# Patient Record
Sex: Male | Born: 1940 | Race: White | Hispanic: No | Marital: Single | Smoking: Former smoker
Health system: Southern US, Community
[De-identification: ages and names within clinical notes are randomized; demographics above are authoritative.]

## PROBLEM LIST (undated history)

## (undated) DIAGNOSIS — E785 Hyperlipidemia, unspecified: Secondary | ICD-10-CM

## (undated) DIAGNOSIS — E119 Type 2 diabetes mellitus without complications: Secondary | ICD-10-CM

## (undated) DIAGNOSIS — I10 Essential (primary) hypertension: Secondary | ICD-10-CM

## (undated) HISTORY — DX: Hyperlipidemia, unspecified: E78.5

## (undated) HISTORY — DX: Essential (primary) hypertension: I10

## (undated) HISTORY — DX: Type 2 diabetes mellitus without complications: E11.9

## (undated) HISTORY — PX: KNEE SURGERY: SHX244

## (undated) HISTORY — PX: OTHER SURGICAL HISTORY: SHX169

## (undated) HISTORY — PX: SALIVARY GLAND SURGERY: SHX768

---

## 2005-04-20 ENCOUNTER — Emergency Department: Payer: Self-pay | Admitting: Emergency Medicine

## 2005-04-20 ENCOUNTER — Other Ambulatory Visit: Payer: Self-pay

## 2010-09-05 ENCOUNTER — Ambulatory Visit: Payer: Self-pay

## 2015-03-09 DIAGNOSIS — M1990 Unspecified osteoarthritis, unspecified site: Secondary | ICD-10-CM | POA: Insufficient documentation

## 2016-03-09 ENCOUNTER — Ambulatory Visit (INDEPENDENT_AMBULATORY_CARE_PROVIDER_SITE_OTHER): Payer: Medicare Other | Admitting: Family Medicine

## 2016-03-09 ENCOUNTER — Encounter: Payer: Self-pay | Admitting: Family Medicine

## 2016-03-09 VITALS — BP 122/64 | HR 89 | Temp 97.7°F | Ht 67.5 in | Wt 239.8 lb

## 2016-03-09 DIAGNOSIS — N400 Enlarged prostate without lower urinary tract symptoms: Secondary | ICD-10-CM

## 2016-03-09 DIAGNOSIS — R600 Localized edema: Secondary | ICD-10-CM | POA: Diagnosis not present

## 2016-03-09 DIAGNOSIS — R300 Dysuria: Secondary | ICD-10-CM

## 2016-03-09 DIAGNOSIS — R079 Chest pain, unspecified: Secondary | ICD-10-CM

## 2016-03-09 DIAGNOSIS — I878 Other specified disorders of veins: Secondary | ICD-10-CM

## 2016-03-09 LAB — POCT URINALYSIS DIPSTICK
Bilirubin, UA: NEGATIVE
Blood, UA: NEGATIVE
Glucose, UA: NEGATIVE
Ketones, UA: NEGATIVE
LEUKOCYTES UA: NEGATIVE
NITRITE UA: NEGATIVE
PROTEIN UA: NEGATIVE
SPEC GRAV UA: 1.025
UROBILINOGEN UA: 0.2
pH, UA: 6

## 2016-03-09 MED ORDER — GLIPIZIDE 10 MG PO TABS: ORAL_TABLET | ORAL | Status: DC

## 2016-03-09 MED ORDER — CEPHALEXIN 500 MG PO CAPS: 500.0000 mg | ORAL_CAPSULE | Freq: Two times a day (BID) | ORAL | Status: DC

## 2016-03-09 NOTE — Progress Notes (Signed)
Pre visit review using our clinic review tool, if applicable. No additional management support is needed unless otherwise documented below in the visit note. 

## 2016-03-09 NOTE — Patient Instructions (Signed)
Nice to meet you. We are going to refer you to a cardiologist for your chest pain. We will start you on an antibiotic to cover for possible infection in your legs. We're going to check some urine as well. If you develop increased leg swelling, pain, chest pain, shortness of breath, palpitations, burning with urination, abdominal pain, or any new or changing symptoms please seek medical attention.

## 2016-03-10 LAB — TSH: TSH: 3.09 u[IU]/mL (ref 0.35–4.50)

## 2016-03-12 ENCOUNTER — Encounter: Payer: Self-pay | Admitting: Family Medicine

## 2016-03-12 DIAGNOSIS — I878 Other specified disorders of veins: Secondary | ICD-10-CM | POA: Insufficient documentation

## 2016-03-12 DIAGNOSIS — N400 Enlarged prostate without lower urinary tract symptoms: Secondary | ICD-10-CM | POA: Insufficient documentation

## 2016-03-12 DIAGNOSIS — R079 Chest pain, unspecified: Secondary | ICD-10-CM | POA: Insufficient documentation

## 2016-03-12 NOTE — Assessment & Plan Note (Signed)
Suspect patient's bilateral lower external swelling is related to venous stasis. Unlikely DVT given chronic duration and bilateral swelling with legs diameters less than 3 cm from each other. The areas of erythema are most likely stasis dermatitis though with warmth we'll cover with Keflex for possible cellulitic infection. 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 Discussed elevation of legs. Discussed obtaining compression stockings. Patient will continue to monitor. He is given return precautions.

## 2016-03-12 NOTE — Progress Notes (Signed)
Patient ID: Patrick Patel, male   DOB: 1941/04/08, 75 y.o.   MRN: [id number]  Marikay Alar, MD Phone: [phone]  Patrick Patel is a 75 y.o. male who presents today for new patient visit.  Bilateral leg swelling: Patient notes for about the last year and a half his bilateral lower legs have been swelling intermittently. He notes the right swells greater than left. Notes he does get shooting pains in his legs. Notes occasionally his lower legs will become red. Swelling is down in the morning and comes back as the day goes on. He does note some lumps within his legs. He does note a small patch of skin on his left leg that is intermittently numb associated with this swelling. This previously been evaluated by his former PCP and advised to elevate his legs.  Chest pain: Patient notes occasionally he will get crushing chest pressure that will go away within minutes. Occurs occasionally after eating breakfast and also when he does something physical. Occurs 2-3 times a week. Last occurrence was the day prior to this visit. No radiation of the pain. No diaphoresis. There is associated shortness of breath. No history of DVT or PE. This is been going on for months. No recent surgeries or periods of inactivity. No chest pain or shortness of breath at this time.  Pain with urination: Patient notes the pain is described as the urine not wanting to come out as fast as it should. It is not a shooting pain. He has had his prostate checked previously and has a history of a slight PSA elevation. No burning with urination, frequency, or urgency. Does report difficulty keeping his stream area  Active Ambulatory Problems    Diagnosis Date Noted  . Chest pain 03/12/2016  . BPH (benign prostatic hyperplasia) 03/12/2016  . Venous stasis of both lower extremities 03/12/2016   Resolved Ambulatory Problems    Diagnosis Date Noted  . No Resolved Ambulatory Problems   Past Medical History  Diagnosis Date  .  Diabetes (HCC)   . Hypertension   . Hyperlipidemia     No family history on file.  Social History   Social History  . Marital Status: Single    Spouse Name: N/A  . Number of Children: N/A  . Years of Education: N/A   Occupational History  . Not on file.   Social History Main Topics  . Smoking status: Former Games developer  . Smokeless tobacco: Not on file  . Alcohol Use: No  . Drug Use: No  . Sexual Activity: Not on file   Other Topics Concern  . Not on file   Social History Narrative    ROS   General:  Negative for nexplained weight loss, fever Skin: Negative for new or changing mole, sore that won't heal HEENT: Negative for trouble hearing, trouble seeing, ringing in ears, mouth sores, hoarseness, change in voice, dysphagia. CV:  Positive for chest pain, dyspnea, edema, negative palpitations Resp: Negative for cough, hemoptysis GI: Positive for diarrhea, constipation, Negative for nausea, vomiting, abdominal pain, melena, hematochezia. GU: Positive for dysuria, urinary hesitancy, negative for incontinence, hematuria, vaginal or penile discharge, polyuria, sexual difficulty, lumps in testicle or breasts MSK: Positive for muscle cramps or aches, joint pain or swelling Neuro: Positive for numbness, Negative for headaches, weakness, dizziness, passing out/fainting Psych: Negative for depression, anxiety, memory problems  Objective  Physical Exam Filed Vitals:   03/09/16 1529  BP: 122/64  Pulse: 89  Temp: 97.7 F (36.5 C)  BP Readings from Last 3 Encounters:  03/09/16 122/64   Wt Readings from Last 3 Encounters:  03/09/16 239 lb 12.8 oz (108.773 kg)    Physical Exam  Constitutional: No distress.  HENT:  Head: Normocephalic and atraumatic.  Right Ear: External ear normal.  Left Ear: External ear normal.  Mouth/Throat: Oropharynx is clear and moist. No oropharyngeal exudate.  Eyes: Conjunctivae are normal. Pupils are equal, round, and reactive to light.    Neck: Neck supple.  Cardiovascular: Normal rate, regular rhythm and normal heart sounds.  Exam reveals no gallop and no friction rub.   No murmur heard. Pulmonary/Chest: Effort normal and breath sounds normal. No respiratory distress. He has no wheezes. He has no rales.  Abdominal: Soft. Bowel sounds are normal. He exhibits no distension. There is no tenderness. There is no rebound and no guarding.  Genitourinary: Rectum normal.  Prostate moderately enlarged, non-nodular  Musculoskeletal:  Bilateral lower extremities with mild swelling, right mildly greater than left with right calf 40 cm and left calf 38 cm there is scattered and tenderness over the anterior portion of his lower extremities bilaterally, no cords palpated in the calves, there is venous stasis dermatitis noted with possible warmth over the medial aspects of his lower legs above the ankles  Lymphadenopathy:    He has no cervical adenopathy.  Neurological: He is alert.  CN 2-12 intact, 5/5 strength in bilateral biceps, triceps, grip, quads, hamstrings, plantar and dorsiflexion, sensation to light touch intact in bilateral UE and LE, normal gait, 2+ patellar reflexes  Skin: Skin is warm and dry. He is not diaphoretic.  Psychiatric: Mood and affect normal.   EKG: Sinus rhythm, first-degree AV block, rate 91, no ischemic changes noted  Assessment/Plan:   Chest pain Patient with typical sounding anginal chest pain. EKG is no apparent ischemic changes. 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 Given the typical nature of his chest pain I advised referral cardiology. Referral has been made. Patient will continue to monitor symptoms. He is given return precautions.  BPH (benign prostatic hyperplasia) Suspect urinary symptoms or BPH related. We'll check a UA. If negative would consider referral to urology for further evaluation and management.  Venous stasis of both lower extremities Suspect patient's bilateral lower external swelling is  related to venous stasis. Unlikely DVT given chronic duration and bilateral swelling with legs diameters less than 3 cm from each other. The areas of erythema are most likely stasis dermatitis though with warmth we'll cover with Keflex for possible cellulitic infection. 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 Discussed elevation of legs. Discussed obtaining compression stockings. Patient will continue to monitor. He is given return precautions.    Orders Placed This Encounter  Procedures  . TSH  . Ambulatory referral to Cardiology    Referral Priority:  Routine    Referral Type:  Consultation    Referral Reason:  Specialty Services Required    Requested Specialty:  Cardiology    Number of Visits Requested:  1  . POCT urinalysis dipstick  . EKG 12-Lead  After the patient left the exam room he ended up asking the nurse for a refill on his glipizide. She confirmed his glipizide dosing. She advised him that he would need to return for an office visit to discuss this further. Refill was sent to his pharmacy.  Marikay AlarEric Irisha Grandmaison, MD [facility] [place]

## 2016-03-12 NOTE — Assessment & Plan Note (Signed)
Patient with typical sounding anginal chest pain. EKG is no apparent ischemic changes. 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 Given the typical nature of his chest pain I advised referral cardiology. Referral has been made. Patient will continue to monitor symptoms. He is given return precautions.

## 2016-03-12 NOTE — Assessment & Plan Note (Signed)
Suspect urinary symptoms or BPH related. We'll check a UA. If negative would consider referral to urology for further evaluation and management.

## 2016-03-13 ENCOUNTER — Encounter: Payer: Self-pay | Admitting: Family Medicine

## 2016-03-29 DIAGNOSIS — Z0000 Encounter for general adult medical examination without abnormal findings: Secondary | ICD-10-CM | POA: Diagnosis not present

## 2016-03-29 DIAGNOSIS — Z23 Encounter for immunization: Secondary | ICD-10-CM | POA: Diagnosis not present

## 2016-03-29 DIAGNOSIS — Z1389 Encounter for screening for other disorder: Secondary | ICD-10-CM | POA: Diagnosis not present

## 2016-04-03 DIAGNOSIS — Z1211 Encounter for screening for malignant neoplasm of colon: Secondary | ICD-10-CM | POA: Diagnosis not present

## 2016-04-04 ENCOUNTER — Other Ambulatory Visit: Payer: Self-pay | Admitting: Surgical

## 2016-04-04 NOTE — Telephone Encounter (Signed)
Please confirm dosing with patient. Please also see if he saw cardiology as a referral was placed. Thanks.

## 2016-04-04 NOTE — Telephone Encounter (Signed)
Can we refill this medication? Last office visit 03/09/16 NP visit.

## 2016-04-05 MED ORDER — GLIPIZIDE 10 MG PO TABS: ORAL_TABLET | ORAL | Status: DC

## 2016-04-05 MED ORDER — GEMFIBROZIL 600 MG PO TABS: 600.0000 mg | ORAL_TABLET | Freq: Two times a day (BID) | ORAL | Status: DC

## 2016-04-05 NOTE — Telephone Encounter (Signed)
LM for patient to return call.

## 2016-04-06 ENCOUNTER — Other Ambulatory Visit: Payer: Self-pay | Admitting: Surgical

## 2016-04-06 MED ORDER — GLIPIZIDE 10 MG PO TABS: ORAL_TABLET | ORAL | Status: DC

## 2016-04-06 MED ORDER — GEMFIBROZIL 600 MG PO TABS: 600.0000 mg | ORAL_TABLET | Freq: Two times a day (BID) | ORAL | Status: DC

## 2016-04-13 ENCOUNTER — Encounter: Payer: Self-pay | Admitting: Family Medicine

## 2016-04-13 ENCOUNTER — Ambulatory Visit (INDEPENDENT_AMBULATORY_CARE_PROVIDER_SITE_OTHER): Payer: Medicare Other | Admitting: Family Medicine

## 2016-04-13 VITALS — BP 136/70 | HR 89 | Temp 98.6°F | Ht 67.5 in | Wt 243.6 lb

## 2016-04-13 DIAGNOSIS — I878 Other specified disorders of veins: Secondary | ICD-10-CM

## 2016-04-13 DIAGNOSIS — R079 Chest pain, unspecified: Secondary | ICD-10-CM | POA: Diagnosis not present

## 2016-04-13 DIAGNOSIS — E119 Type 2 diabetes mellitus without complications: Secondary | ICD-10-CM

## 2016-04-13 DIAGNOSIS — I208 Other forms of angina pectoris: Secondary | ICD-10-CM

## 2016-04-13 DIAGNOSIS — I872 Venous insufficiency (chronic) (peripheral): Secondary | ICD-10-CM | POA: Diagnosis not present

## 2016-04-13 DIAGNOSIS — E785 Hyperlipidemia, unspecified: Secondary | ICD-10-CM | POA: Insufficient documentation

## 2016-04-13 DIAGNOSIS — E1140 Type 2 diabetes mellitus with diabetic neuropathy, unspecified: Secondary | ICD-10-CM | POA: Insufficient documentation

## 2016-04-13 DIAGNOSIS — I2089 Other forms of angina pectoris: Secondary | ICD-10-CM

## 2016-04-13 MED ORDER — DOXYCYCLINE HYCLATE 100 MG PO TABS: 100.0000 mg | ORAL_TABLET | Freq: Two times a day (BID) | ORAL | Status: DC

## 2016-04-13 MED ORDER — METFORMIN HCL 850 MG PO TABS: 850.0000 mg | ORAL_TABLET | Freq: Three times a day (TID) | ORAL | Status: DC

## 2016-04-13 NOTE — Progress Notes (Signed)
Patient ID: Patrick Patel, male   DOB: 01/18/1941, 75 y.o.   MRN: [id number]  Patrick AlarEric Arcola Freshour, MD Phone: [phone]  Patrick D Faustino CongressGaster is a 75 y.o. male who presents today for follow-up.  DIABETES Disease Monitoring: Blood Sugar ranges-rarely gets below 200, does not check very frequently Polyuria/phagia/dipsia- some polyuria      ophthalmology- has an appointment next month Medications: Compliance- metformin 1000 mg twice daily, glipizide 20 mg twice daily Hypoglycemic symptoms- no  HYPERLIPIDEMIA Symptoms Chest pain on exertion:  Yes, see below   Leg claudication:   No Medications: Compliance- taking Lipitor and Lopid Right upper quadrant pain- no  Exertional chest pain: Patient notes he has continued intermittent chest pain. Lasts for 5-10 minutes with exertion. Resolves with rest. Does get some shortness of breath with it. He does note sometimes he is short of breath without chest pain. Some palpitations as well. He does note most the time the symptoms occur about an hour after eating. He did use to smoke. No history of DVT. He has no chest pain or shortness breath at this time. He has an appointment with cardiology next week.  Venous insufficiency: Patient's bilateral legs with swelling and venous insufficiency skin changes. No calf pain noted. This redness did improve some with Keflex after last visit. Notes the redness has gotten mildly worse since coming off the antibiotics. Notes his right leg has been oozing from the lateral aspect. No fevers. Does note some pain in his distal lower leg over the anterior aspect occasionally. Notes pain gets improved with walking.  PMH: Former smoker   ROS see history of present illness  Objective  Physical Exam Filed Vitals:   04/13/16 1546  BP: 136/70  Pulse: 89  Temp: 98.6 F (37 C)    BP Readings from Last 3 Encounters:  04/13/16 136/70  03/09/16 122/64   Wt Readings from Last 3 Encounters:  04/13/16 243 lb 9.6 oz (110.496 kg)    03/09/16 239 lb 12.8 oz (108.773 kg)    Physical Exam  Constitutional: He is well-developed, well-nourished, and in no distress.  HENT:  Head: Normocephalic and atraumatic.  Mouth/Throat: Oropharynx is clear and moist. No oropharyngeal exudate.  Eyes: Conjunctivae are normal. Pupils are equal, round, and reactive to light.  Cardiovascular: Normal rate, regular rhythm and normal heart sounds.   Pulmonary/Chest: Effort normal and breath sounds normal.  Musculoskeletal:  Bilateral lower extremities with trace edema, distal lower extremity with mild erythema bilaterally with some warmth, right lateral aspect of the leg with minimal scabbing and oozing, calves are nontender with no cords, leg is nontender, 2+ DP pulses  Neurological: He is alert. Gait normal.  Skin: Skin is warm and dry. He is not diaphoretic.    EKG: Normal sinus rhythm, first-degree AV block, rate 85, no ST or T-wave changes  Assessment/Plan: Please see individual problem list.  Venous stasis of both lower extremities Swelling in legs consistent with venous insufficiency and stasis dermatitis, though there is drainage and warmth which could be consistent with an infectious cause. We'll check a CBC to look at a white count. We will start him on Doxy and if the white count is normal we'll have him stop this. We will refer him to vascular surgery for further management and evaluation of his venous insufficiency. He is given return precautions.  Chest pain Continues to have this intermittently. EKG remains reassuring today. No symptoms at this time. We will additionally check a chest x-ray to ensure no pulmonary  cause. He will keep the appointment with vascular surgery cardiology next week. Given return precautions.  Diabetes (HCC) Appears to be uncontrolled given his CBG reports. We'll check a BMP today. Last A1c was 7.7. We will increase his metformin to max dose of 850 3 times a day. He will use 1500 mg in the morning and  1000 mg at night to finish his current prescription. I discussed this with him. He'll see an eye doctor in the next month. May need to consider additional medication if blood sugar does not become under better control.  Hyperlipidemia Continue Lopid and Lipitor.    Orders Placed This Encounter  Procedures  . DG Chest 2 View    Standing Status: Future     Number of Occurrences:      Standing Expiration Date: 06/13/2017    Order Specific Question:  Reason for Exam (SYMPTOM  OR DIAGNOSIS REQUIRED)    Answer:  chronic intermittent chest pain and shortness of breath    Order Specific Question:  Preferred imaging location?    Answer:  [facility]  . Basic Metabolic Panel (BMET)  . CBC  . Ambulatory referral to Vascular Surgery    Referral Priority:  Routine    Referral Type:  Surgical    Referral Reason:  Specialty Services Required    Requested Specialty:  Vascular Surgery    Number of Visits Requested:  1  . EKG 12-Lead    Meds ordered this encounter  Medications  . DISCONTD: doxycycline (VIBRA-TABS) 100 MG tablet    Sig: Take 1 tablet (100 mg total) by mouth 2 (two) times daily.    Dispense:  20 tablet    Refill:  0  . metFORMIN (GLUCOPHAGE) 850 MG tablet    Sig: Take 1 tablet (850 mg total) by mouth 3 (three) times daily with meals.    Dispense:  90 tablet    Refill:  3  . doxycycline (VIBRA-TABS) 100 MG tablet    Sig: Take 1 tablet (100 mg total) by mouth 2 (two) times daily.    Dispense:  20 tablet    Refill:  0    Patrick Alar, MD [facility]

## 2016-04-13 NOTE — Assessment & Plan Note (Signed)
Continues to have this intermittently. EKG remains reassuring today. No symptoms at this time. We will additionally check a chest x-ray to ensure no pulmonary cause. He will keep the appointment with vascular surgery cardiology next week. Given return precautions.

## 2016-04-13 NOTE — Patient Instructions (Addendum)
Nice to see you. Please keep your appointment with cardiologist. We will increase your metformin to the maximum dose to treat your diabetes. Please continue your cholesterol medications. We are going to refer you to vascular surgery for your legs for further evaluation. Please go get the chest x-ray to evaluate your chest pain further as well. If you develop chest pain, shortness of breath, palpitations, sweating, leg pain, increased size in one leg versus the other, fevers, or any new or changing symptoms please seek medical attention.

## 2016-04-13 NOTE — Assessment & Plan Note (Signed)
Continue Lopid and Lipitor.

## 2016-04-13 NOTE — Assessment & Plan Note (Signed)
Appears to be uncontrolled given his CBG reports. We'll check a BMP today. Last A1c was 7.7. We will increase his metformin to max dose of 850 3 times a day. He will use 1500 mg in the morning and 1000 mg at night to finish his current prescription. I discussed this with him. He'll see an eye doctor in the next month. May need to consider additional medication if blood sugar does not become under better control.

## 2016-04-13 NOTE — Progress Notes (Signed)
Pre visit review using our clinic review tool, if applicable. No additional management support is needed unless otherwise documented below in the visit note. 

## 2016-04-13 NOTE — Assessment & Plan Note (Signed)
Swelling in legs consistent with venous insufficiency and stasis dermatitis, though there is drainage and warmth which could be consistent with an infectious cause. We'll check a CBC to look at a white count. We will start him on Doxy and if the white count is normal we'll have him stop this. We will refer him to vascular surgery for further management and evaluation of his venous insufficiency. He is given return precautions.

## 2016-04-14 LAB — CBC
HCT: 40.2 % (ref 39.0–52.0)
HEMOGLOBIN: 13.9 g/dL (ref 13.0–17.0)
MCHC: 34.5 g/dL (ref 30.0–36.0)
MCV: 91.2 fl (ref 78.0–100.0)
Platelets: 256 10*3/uL (ref 150.0–400.0)
RBC: 4.41 Mil/uL (ref 4.22–5.81)
RDW: 13.2 % (ref 11.5–15.5)
WBC: 7.7 10*3/uL (ref 4.0–10.5)

## 2016-04-14 LAB — BASIC METABOLIC PANEL
BUN: 21 mg/dL (ref 6–23)
CHLORIDE: 100 meq/L (ref 96–112)
CO2: 26 mEq/L (ref 19–32)
Calcium: 10.2 mg/dL (ref 8.4–10.5)
Creatinine, Ser: 0.83 mg/dL (ref 0.40–1.50)
GFR: 96.35 mL/min (ref >60.00)
Glucose, Bld: 218 mg/dL — ABNORMAL HIGH (ref 70–99)
POTASSIUM: 5 meq/L (ref 3.5–5.1)
SODIUM: 137 meq/L (ref 135–145)

## 2016-04-19 ENCOUNTER — Ambulatory Visit
Admission: RE | Admit: 2016-04-19 | Discharge: 2016-04-19 | Disposition: A | Discharge status: Home / Self Care | Payer: Medicare Other | Source: Ambulatory Visit | Attending: Family Medicine | Admitting: Family Medicine

## 2016-04-19 DIAGNOSIS — I208 Other forms of angina pectoris: Secondary | ICD-10-CM | POA: Insufficient documentation

## 2016-04-19 DIAGNOSIS — R0602 Shortness of breath: Secondary | ICD-10-CM | POA: Diagnosis not present

## 2016-04-20 ENCOUNTER — Telehealth: Payer: Self-pay | Admitting: Family Medicine

## 2016-04-20 NOTE — Telephone Encounter (Signed)
Pt is calling about his x-ray results. Please call him at [phone]. °

## 2016-04-21 ENCOUNTER — Ambulatory Visit: Payer: Medicare Other | Admitting: Cardiovascular Disease

## 2016-04-21 NOTE — Telephone Encounter (Signed)
Spoke with the patient, see result note for details.  

## 2016-04-24 ENCOUNTER — Other Ambulatory Visit: Payer: Self-pay | Admitting: Family Medicine

## 2016-04-24 DIAGNOSIS — J849 Interstitial pulmonary disease, unspecified: Secondary | ICD-10-CM

## 2016-04-25 ENCOUNTER — Other Ambulatory Visit: Payer: Self-pay | Admitting: Surgical

## 2016-04-28 NOTE — Telephone Encounter (Signed)
Spoke to patient. Patient's NPH dose is on a sliding scale ranging between 15-20 units bid.

## 2016-04-28 NOTE — Telephone Encounter (Signed)
-----   Message from Glori LuisEric G Sonnenberg, MD sent at 04/26/2016  6:42 PM EDT ----- Please confirm the patient's NPH dose so that I can advise on dosing. Thanks.

## 2016-05-01 ENCOUNTER — Ambulatory Visit: Admission: RE | Admit: 2016-05-01 | Payer: Medicare Other | Source: Ambulatory Visit

## 2016-05-02 ENCOUNTER — Ambulatory Visit
Admission: RE | Admit: 2016-05-02 | Discharge: 2016-05-02 | Disposition: A | Discharge status: Home / Self Care | Payer: Medicare Other | Source: Ambulatory Visit | Attending: Family Medicine | Admitting: Family Medicine

## 2016-05-02 DIAGNOSIS — R0789 Other chest pain: Secondary | ICD-10-CM | POA: Insufficient documentation

## 2016-05-02 DIAGNOSIS — R0602 Shortness of breath: Secondary | ICD-10-CM | POA: Insufficient documentation

## 2016-05-02 DIAGNOSIS — J849 Interstitial pulmonary disease, unspecified: Secondary | ICD-10-CM

## 2016-05-02 DIAGNOSIS — I2510 Atherosclerotic heart disease of native coronary artery without angina pectoris: Secondary | ICD-10-CM | POA: Insufficient documentation

## 2016-05-02 NOTE — Telephone Encounter (Signed)
Called patient. Gave imaging results. Patient verbalized understanding.   

## 2016-05-02 NOTE — Telephone Encounter (Signed)
Patient should take 20 units of NPH insulin in the morning and 15 units at night. We will need this to be more consistent given his elevated A1c. Will likely need to increase these again at a later date. He should monitor his sugars 2-3 times a day prior to meals. If his sugars consistently run less than 100 he should let us know. Thanks.

## 2016-05-04 ENCOUNTER — Encounter: Payer: Self-pay | Admitting: Family Medicine

## 2016-05-04 DIAGNOSIS — I2510 Atherosclerotic heart disease of native coronary artery without angina pectoris: Secondary | ICD-10-CM | POA: Insufficient documentation

## 2016-05-23 ENCOUNTER — Ambulatory Visit: Payer: Medicare Other | Admitting: Cardiovascular Disease

## 2016-05-25 ENCOUNTER — Other Ambulatory Visit: Payer: Self-pay | Admitting: Family Medicine

## 2016-06-01 ENCOUNTER — Other Ambulatory Visit: Payer: Self-pay | Admitting: Family Medicine

## 2016-06-16 ENCOUNTER — Encounter: Payer: Self-pay | Admitting: Cardiovascular Disease

## 2016-06-16 ENCOUNTER — Encounter (INDEPENDENT_AMBULATORY_CARE_PROVIDER_SITE_OTHER): Payer: Self-pay

## 2016-06-16 ENCOUNTER — Ambulatory Visit (INDEPENDENT_AMBULATORY_CARE_PROVIDER_SITE_OTHER): Payer: Medicare Other | Admitting: Cardiovascular Disease

## 2016-06-16 VITALS — BP 130/70 | HR 96 | Ht 69.0 in | Wt 241.0 lb

## 2016-06-16 DIAGNOSIS — R0602 Shortness of breath: Secondary | ICD-10-CM | POA: Diagnosis not present

## 2016-06-16 NOTE — Patient Instructions (Addendum)
Medication Instructions:  Your physician recommends that you continue on your current medications as directed. Please refer to the Current Medication list given to you today.   Labwork: none  Testing/Procedures: Your physician has requested that you have an echocardiogram. Echocardiography is a painless test that uses sound waves to create images of your heart. It provides your doctor with information about the size and shape of your heart and how well your heart's chambers and valves are working. This procedure takes approximately one hour. There are no restrictions for this procedure.  July 28, 3pm   Your physician has requested that you have a lexiscan myoview. For further information please visit [email or web address]. Please follow instruction sheet, as given.  [facility] MYOVIEW  Your caregiver has ordered a Stress Test with nuclear imaging. The purpose of this test is to evaluate the blood supply to your heart muscle. This procedure is referred to as a "Non-Invasive Stress Test." This is because other than having an IV started in your vein, nothing is inserted or "invades" your body. Cardiac stress tests are done to find areas of poor blood flow to the heart by determining the extent of coronary artery disease (CAD). Some patients exercise on a treadmill, which naturally increases the blood flow to your heart, while others who are  unable to walk on a treadmill due to physical limitations have a pharmacologic/chemical stress agent called Lexiscan . This medicine will mimic walking on a treadmill by temporarily increasing your coronary blood flow.   Please note: these test may take anywhere between 2-4 hours to complete  [facility]  THE VOLUNTEERS AT THE FIRST DESK WILL DIRECT YOU WHERE TO GO  Date of Procedure:_____________________________________  Arrival Time for Procedure:______________________________  Instructions regarding medication:   ____ : Hold  diabetes medication morning of procedure  ____:  Hold betablocker(s) night before procedure and morning of procedure  ____:  Hold other medications as follows:_________________________________________________________________________________________________________________________________________________________________________________________________________________________________________________________________________________________  PLEASE NOTIFY THE OFFICE AT LEAST 24 HOURS IN ADVANCE IF YOU ARE UNABLE TO KEEP YOUR APPOINTMENT.  [phone] AND  PLEASE NOTIFY NUCLEAR MEDICINE AT [facility] AT LEAST 24 HOURS IN ADVANCE IF YOU ARE UNABLE TO KEEP YOUR APPOINTMENT. [phone]  How to prepare for your Myoview test:   Do not eat or drink after midnight  No caffeine for 24 hours prior to test  No smoking 24 hours prior to test.  Your medication may be taken with water.  If your doctor stopped a medication because of this test, do not take that medication.  Ladies, please do not wear dresses.  Skirts or pants are appropriate. Please wear a short sleeve shirt.  No perfume, cologne or lotion.  Wear comfortable walking shoes. No heels!            Follow-Up: Your physician recommends that you schedule a follow-up appointment as needed.   Any Other Special Instructions Will Be Listed Below (If Applicable).     If you need a refill on your cardiac medications before your next appointment, please call your pharmacy.  Cardiac Nuclear Scanning A cardiac nuclear scan is used to check your heart for problems, such as the following:  A portion of the heart is not getting enough blood.  Part of the heart muscle has died, which happens with a heart attack.  The heart wall is not working normally.  In this test, a radioactive dye (tracer) is injected into your bloodstream. After the tracer has traveled to your heart, a  scanning device is used to measure how much of the tracer is  absorbed by or distributed to various areas of your heart. LET [facility] KNOW ABOUT:  Any allergies you have.  All medicines you are taking, including vitamins, herbs, eye drops, creams, and over-the-counter medicines.  Previous problems you or members of your family have had with the use of anesthetics.  Any blood disorders you have.  Previous surgeries you have had.  Medical conditions you have.  RISKS AND COMPLICATIONS Generally, this is a safe procedure. However, as with any procedure, problems can occur. Possible problems include:   Serious chest pain.  Rapid heartbeat.  Sensation of warmth in your chest. This usually passes quickly. BEFORE THE PROCEDURE Ask your health care provider about changing or stopping your regular medicines. PROCEDURE This procedure is usually done at a hospital and takes 2-4 hours.  An IV tube is inserted into one of your veins.  Your health care provider will inject a small amount of radioactive tracer through the tube.  You will then wait for 20-40 minutes while the tracer travels through your bloodstream.  You will lie down on an exam table so images of your heart can be taken. Images will be taken for about 15-20 minutes.  You will exercise on a treadmill or stationary bike. While you exercise, your heart activity will be monitored with an electrocardiogram (ECG), and your blood pressure will be checked.  If you are unable to exercise, you may be given a medicine to make your heart beat faster.  When blood flow to your heart has peaked, tracer will again be injected through the IV tube.  After 20-40 minutes, you will get back on the exam table and have more images taken of your heart.  When the procedure is over, your IV tube will be removed. AFTER THE PROCEDURE  You will likely be able to leave shortly after the test. Unless your health care provider tells you otherwise, you may return to your normal schedule,  including diet, activities, and medicines.  Make sure you find out how and when you will get your test results.   This information is not intended to replace advice given to you by your health care provider. Make sure you discuss any questions you have with your health care provider.   Document Released: 12/22/2004 Document Revised: 12/02/2013 Document Reviewed: 11/05/2013 Elsevier Interactive Patient Education [employer] Elsevier Inc. Echocardiogram An echocardiogram, or echocardiography, uses sound waves (ultrasound) to produce an image of your heart. The echocardiogram is simple, painless, obtained within a short period of time, and offers valuable information to your health care provider. The images from an echocardiogram can provide information such as:  Evidence of coronary artery disease (CAD).  Heart size.  Heart muscle function.  Heart valve function.  Aneurysm detection.  Evidence of a past heart attack.  Fluid buildup around the heart.  Heart muscle thickening.  Assess heart valve function. LET [facility] PROVIDER KNOW ABOUT:  Any allergies you have.  All medicines you are taking, including vitamins, herbs, eye drops, creams, and over-the-counter medicines.  Previous problems you or members of your family have had with the use of anesthetics.  Any blood disorders you have.  Previous surgeries you have had.  Medical conditions you have.  Possibility of pregnancy, if this applies. BEFORE THE PROCEDURE  No special preparation is needed. Eat and drink normally.  PROCEDURE   In order to produce an image of your heart,  gel will be applied to your chest and a wand-like tool (transducer) will be moved over your chest. The gel will help transmit the sound waves from the transducer. The sound waves will harmlessly bounce off your heart to allow the heart images to be captured in real-time motion. These images will then be recorded.  You may need an IV to receive a  medicine that improves the quality of the pictures. AFTER THE PROCEDURE You may return to your normal schedule including diet, activities, and medicines, unless your health care provider tells you otherwise.   This information is not intended to replace advice given to you by your health care provider. Make sure you discuss any questions you have with your health care provider.   Document Released: 11/24/2000 Document Revised: 12/18/2014 Document Reviewed: 08/04/2013 Elsevier Interactive Patient Education [employer]

## 2016-06-16 NOTE — Progress Notes (Signed)
Cardiology Office Note   Date:  06/16/2016   ID:  Patrick Dicerchie D Bacallao, DOB 10/04/1941, MRN [id number]  PCP:  Marikay AlarEric Sonnenberg, MD  Cardiologist:   Lorine BearsMuhammad Arida, MD   Chief Complaint  Patient presents with  . New Evaluation    pt c/o chest pain after eating and SOB  . Chest Pain  . Shortness of Breath      History of Present Illness: Patrick Patel is a 75 y.o. male who was referred by Dr. Birdie SonsSonnenberg for evaluation for exertional chest pain and coronary atherosclerosis noted on CT scan. He has no previous cardiac history. He has multiple chronic medical conditions that include diabetes mellitus, hypertension and hyperlipidemia. CT scan of the lungs in May showed no evidence of interstitial lung disease. There was atherosclerosis involving the left main and three-vessel coronary artery disease. There was also calcification noted on the aortic valve. He has been having intermittent chest pain which is substernal and described as aching sensation which last for about 5-10 minutes. It mostly happens after he eats with no associated heartburn. It occasionally can worsen with physical activities. He does complain of exertional dyspnea which has worsened recently. He doesn't feel that the symptoms have affected his activities of daily living. He is a previous smoker. There is family history of cancer but no coronary artery disease. His father died of aortic aneurysm rupture. The patient did have previous CT abdomen years ago that showed no evidence of aortic aneurysm.    Past Medical History  Diagnosis Date  . Diabetes (HCC)   . Hypertension   . Hyperlipidemia     No past surgical history on file.   Current Outpatient Prescriptions  Medication Sig Dispense Refill  . acetaminophen (TYLENOL) 325 MG tablet Take by mouth.    [name]. aspirin EC 325 MG tablet Take by mouth.    [name]. atorvastatin (LIPITOR) 10 MG tablet Take by mouth.    . chlorthalidone (HYGROTON) 50 MG tablet Take by mouth.    .  diphenhydrAMINE (BENADRYL) 12.5 MG/5ML elixir Take by mouth.    [name]. gemfibrozil (LOPID) 600 MG tablet Take 1 tablet (600 mg total) by mouth 2 (two) times daily before a meal. 180 tablet 0  . glipiZIDE (GLUCOTROL) 10 MG tablet Take 2 tablets by mouth  twice a day 360 tablet 1  . insulin NPH Human (NOVOLIN N) 100 UNIT/ML injection 20 Units daily before breakfast.     . Magnesium 500 MG CAPS Take by mouth.    . metFORMIN (GLUCOPHAGE) 850 MG tablet Take 1 tablet by mouth 3  times daily with meals 270 tablet 2   No current facility-administered medications for this visit.    Allergies:   Pioglitazone and Rosiglitazone    Social History:  The patient  reports that he has quit smoking. He does not have any smokeless tobacco history on file. He reports that he does not drink alcohol or use illicit drugs.   Family History:  The patient's family history is remarkable for cancer. No premature coronary artery disease.   ROS:  Please see the history of present illness.   Otherwise, review of systems are positive for none.   All other systems are reviewed and negative.    PHYSICAL EXAM: VS:  BP 130/70 mmHg  Pulse 96  Ht 5\' 9"  (1.753 m)  Wt 241 lb (109.317 kg)  BMI 35.57 kg/m2  SpO2 96% , BMI Body mass index is 35.57 kg/(m^2). GEN: Well nourished, well developed,  in no acute distress HEENT: normal Neck: no JVD, carotid bruits, or masses Cardiac: RRR; no murmurs, rubs, or gallops, mild edema with chronic stasis dermatitis. Respiratory:  clear to auscultation bilaterally, normal work of breathing GI: soft, nontender, nondistended, + BS MS: no deformity or atrophy Skin: warm and dry, no rash Neuro:  Strength and sensation are intact Psych: euthymic mood, full affect   EKG:  EKG is ordered today. The ekg ordered today demonstrates normal sinus rhythm with first-degree AV block. No significant ST or T wave changes.   Recent Labs: 03/09/2016: TSH 3.09 04/13/2016: BUN 21; Creatinine, Ser 0.83;  Hemoglobin 13.9; Platelets 256.0; Potassium 5.0; Sodium 137    Lipid Panel No results found for: CHOL, TRIG, HDL, CHOLHDL, VLDL, LDLCALC, LDLDIRECT    Wt Readings from Last 3 Encounters:  06/16/16 241 lb (109.317 kg)  04/13/16 243 lb 9.6 oz (110.496 kg)  03/09/16 239 lb 12.8 oz (108.773 kg)       ASSESSMENT AND PLAN:  1.  Atypical chest pain: His symptoms are overall atypical but he does have associated exertional dyspnea with evidence of atherosclerosis on CT scan and multiple risk factors for coronary artery disease. I requested a pharmacologic nuclear stress test. The patient has arthritis and probably will not be able to get his heart rate up. However, the patient is requesting a plain treadmill stress test. We'll schedule him for this and see if we can achieve an 85% maximum predicted heart rate. Given his symptoms of dyspnea, I also requested an echocardiogram.  2. Coronary atherosclerosis: Evaluated with a stress test as outlined above. If stress testing is negative, I recommend continuing treatment of risk factors.  3. Hyperlipidemia: Currently on atorvastatin and gemfibrozil. No recent lipid profile. If triglyceride is not significantly elevated, I recommend stopping gemfibrozil and keeping him on a statin. This way we decrease the risk of complications.  4. Essential hypertension: The pressure is reasonably controlled on current medications.   Disposition:   FU with me prn.   Signed,  Lorine BearsMuhammad Arida, MD  06/16/2016 5:42 PM    [facility]

## 2016-06-22 ENCOUNTER — Other Ambulatory Visit: Payer: Self-pay

## 2016-06-22 ENCOUNTER — Telehealth: Payer: Self-pay | Admitting: Cardiovascular Disease

## 2016-06-22 DIAGNOSIS — R0602 Shortness of breath: Secondary | ICD-10-CM

## 2016-06-22 NOTE — Telephone Encounter (Signed)
Pt prefers GXT instead of myoview. Per Dr. Kirke CorinArida: "We can try GXT but if he is not able to get heart rate up enough, will have to switch him. " S/w pt who is agreeable to GXT July 27 @ 3pm and echo @ 4pm. He verbalized understanding and is appreciative of the call.

## 2016-06-26 ENCOUNTER — Other Ambulatory Visit: Payer: Self-pay

## 2016-06-27 ENCOUNTER — Other Ambulatory Visit: Payer: Self-pay

## 2016-06-27 DIAGNOSIS — E785 Hyperlipidemia, unspecified: Secondary | ICD-10-CM

## 2016-06-27 MED ORDER — GEMFIBROZIL 600 MG PO TABS: 600.0000 mg | ORAL_TABLET | Freq: Two times a day (BID) | ORAL | Status: DC

## 2016-06-27 NOTE — Telephone Encounter (Signed)
Patient notified.  Fasting lab appointment scheduled 07/07/16.  Future order has been placed.

## 2016-06-27 NOTE — Telephone Encounter (Signed)
Refill sent in. Patient needs to come in for fasting lipid panel.

## 2016-07-05 NOTE — Telephone Encounter (Signed)
This encounter was created in error - please disregard.

## 2016-07-06 ENCOUNTER — Ambulatory Visit (INDEPENDENT_AMBULATORY_CARE_PROVIDER_SITE_OTHER): Payer: Medicare Other

## 2016-07-06 ENCOUNTER — Other Ambulatory Visit: Payer: Self-pay

## 2016-07-06 ENCOUNTER — Telehealth: Payer: Self-pay | Admitting: Cardiovascular Disease

## 2016-07-06 ENCOUNTER — Encounter (INDEPENDENT_AMBULATORY_CARE_PROVIDER_SITE_OTHER): Payer: Self-pay

## 2016-07-06 DIAGNOSIS — R0602 Shortness of breath: Secondary | ICD-10-CM

## 2016-07-06 DIAGNOSIS — R079 Chest pain, unspecified: Secondary | ICD-10-CM

## 2016-07-06 LAB — EXERCISE TOLERANCE TEST
CHL CUP MPHR: 147 {beats}/min
CHL CUP RESTING HR STRESS: 100 {beats}/min
CHL CUP STRESS STAGE 1 DBP: 91 mmHg
CHL CUP STRESS STAGE 1 SBP: 161 mmHg
CHL CUP STRESS STAGE 2 SPEED: 0 mph
CSEPEDS: 36 s
Estimated workload: 1 METS
Exercise duration (min): 1 min
Percent HR: 82 %
Stage 1 Grade: 0 %
Stage 1 HR: 103 {beats}/min
Stage 1 Speed: 0 mph
Stage 2 Grade: 0 %
Stage 2 HR: 103 {beats}/min

## 2016-07-06 NOTE — Telephone Encounter (Signed)
Per verbal from Dr. Kirke Corin, schedule pt for lexi myoview as pt was unable to complete GXT today. Left message on machine for patient to contact the office.

## 2016-07-07 ENCOUNTER — Other Ambulatory Visit: Payer: Medicare Other

## 2016-07-07 NOTE — Telephone Encounter (Signed)
Attempted to schedule lexi myoview with patient as he was unable to complete GXT. Pt refused myoview and inquires of the echo results. Informed pt I will call him when echo results are available. Pt had no questions at this time.  Will forward to MD to make aware.

## 2016-08-18 ENCOUNTER — Ambulatory Visit (INDEPENDENT_AMBULATORY_CARE_PROVIDER_SITE_OTHER): Payer: Medicare Other | Admitting: Family Medicine

## 2016-08-18 ENCOUNTER — Encounter (INDEPENDENT_AMBULATORY_CARE_PROVIDER_SITE_OTHER): Payer: Self-pay

## 2016-08-18 ENCOUNTER — Other Ambulatory Visit: Payer: Medicare Other

## 2016-08-18 VITALS — BP 132/68 | HR 87 | Temp 98.2°F | Wt 239.8 lb

## 2016-08-18 DIAGNOSIS — Z23 Encounter for immunization: Secondary | ICD-10-CM | POA: Diagnosis not present

## 2016-08-18 DIAGNOSIS — E785 Hyperlipidemia, unspecified: Secondary | ICD-10-CM | POA: Diagnosis not present

## 2016-08-18 DIAGNOSIS — I25118 Atherosclerotic heart disease of native coronary artery with other forms of angina pectoris: Secondary | ICD-10-CM | POA: Diagnosis not present

## 2016-08-18 DIAGNOSIS — E1142 Type 2 diabetes mellitus with diabetic polyneuropathy: Secondary | ICD-10-CM | POA: Diagnosis not present

## 2016-08-18 LAB — COMPREHENSIVE METABOLIC PANEL
ALT: 16 U/L (ref 0–53)
AST: 19 U/L (ref 0–37)
Albumin: 4.4 g/dL (ref 3.5–5.2)
Alkaline Phosphatase: 69 U/L (ref 39–117)
BILIRUBIN TOTAL: 0.4 mg/dL (ref 0.2–1.2)
BUN: 24 mg/dL — ABNORMAL HIGH (ref 6–23)
CHLORIDE: 103 meq/L (ref 96–112)
CO2: 28 meq/L (ref 19–32)
Calcium: 9.1 mg/dL (ref 8.4–10.5)
Creatinine, Ser: 0.83 mg/dL (ref 0.40–1.50)
GFR: 96.26 mL/min (ref >60.00)
GLUCOSE: 220 mg/dL — AB (ref 70–99)
Potassium: 4.6 mEq/L (ref 3.5–5.1)
Sodium: 139 mEq/L (ref 135–145)
Total Protein: 7.5 g/dL (ref 6.0–8.3)

## 2016-08-18 LAB — LIPID PANEL
Cholesterol: 154 mg/dL (ref 0–200)
HDL: 28.8 mg/dL — AB (ref >39.00)
NONHDL: 125.01
Total CHOL/HDL Ratio: 5
Triglycerides: 351 mg/dL — ABNORMAL HIGH (ref 0.0–149.0)
VLDL: 70.2 mg/dL — AB (ref 0.0–40.0)

## 2016-08-18 LAB — HEMOGLOBIN A1C: HEMOGLOBIN A1C: 7 % — AB (ref 4.6–6.5)

## 2016-08-18 LAB — LDL CHOLESTEROL, DIRECT: Direct LDL: 72 mg/dL

## 2016-08-18 NOTE — Patient Instructions (Signed)
Nice to see you. Please only take 1 aspirin a day. We will check some lab work and call you with the results. I will send a message to your cardiologist to see if they would like to do any further workup for your chest pain. If you develop persistent chest pain, shortness of breath, or any new or changing symptoms please seek medical attention.

## 2016-08-18 NOTE — Progress Notes (Signed)
Pre visit review using our clinic review tool, if applicable. No additional management support is needed unless otherwise documented below in the visit note. 

## 2016-08-19 ENCOUNTER — Encounter: Payer: Self-pay | Admitting: Family Medicine

## 2016-08-19 NOTE — Progress Notes (Signed)
  [name], MD Phone: [phone]  Patrick Patel is a 75 y.o. male who presents today for f/u.  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 As burning and tingling in his feet. Cannot afford treatment for it. States he does not care about this at this time.  HYPERLIPIDEMIA Symptoms Chest pain on exertion:  Yes, see below   Leg claudication:   No Medications: Compliance- taking Lipitor and Lopid Right upper quadrant pain- no  Muscle aches- no muscle aches that does note some cramps in his sides and feet  Chest pain: Patient saw cardiology. He had an echo that was reassuring. He had an exercise stress test which was not adequate given his lack of exercise tolerance. It was recommended that he have a nuclear stress test though he is refusing this at this time. Notes he continues to periodically have chest pain. Typically goes away with some physical activity though onset is during physical activity. Does get short of breath with this. He is unsure if he wants to proceed with any further workup. He takes aspirin though reports he is taking 650 mg 1-2 times per day. No chest pain at this time.   PMH: Former smoker.   ROS see history of present illness  Objective  Physical Exam Vitals:   08/18/16 1357  BP: 132/68  Pulse: 87  Temp: 98.2 F (36.8 C)    BP Readings from Last 3 Encounters:  08/18/16 132/68  06/16/16 130/70  04/13/16 136/70   Wt Readings from Last 3 Encounters:  08/18/16 239 lb 12.8 oz (108.8 kg)  06/16/16 241 lb (109.3 kg)  04/13/16 243 lb 9.6 oz (110.5 kg)    Physical Exam  Constitutional: No distress.  HENT:  Head: Normocephalic and atraumatic.  Cardiovascular: Normal rate, regular rhythm and normal heart sounds.   Pulmonary/Chest:  Effort normal and breath sounds normal.  Neurological: He is alert. Gait normal.  Skin: Skin is warm and dry. He is not diaphoretic.     Assessment/Plan: Please see individual problem list.  CAD (coronary artery disease) Patient unable to tolerate exercise tolerance test for workup of this issue. He is currently refusing nuclear stress test. We will send a message to his cardiologist to see what the next step in evaluation will be for him. I did advise that he only take a single 325 aspirin daily. I did advise the risk of taking more than that. He is given return precautions.  Diabetes ([place]) Blood sugars at home appear to be uncontrolled. We'll check an A1c today. He will continue his current regimen until we find out what his A1c is.  Hyperlipidemia Continue current medications. Check labs as outlined below.   Orders Placed This Encounter  Procedures  . Flu vaccine HIGH DOSE PF  . Lipid Profile  . HgB A1c  . Comp Met (CMET)  . LDL cholesterol, direct    [name], MD [place]

## 2016-08-19 NOTE — Assessment & Plan Note (Signed)
Continue current medications. Check labs as outlined below.

## 2016-08-19 NOTE — Assessment & Plan Note (Signed)
Blood sugars at home appear to be uncontrolled. We'll check an A1c today. He will continue his current regimen until we find out what his A1c is.

## 2016-08-19 NOTE — Assessment & Plan Note (Signed)
Patient unable to tolerate exercise tolerance test for workup of this issue. He is currently refusing nuclear stress test. We will send a message to his cardiologist to see what the next step in evaluation will be for him. I did advise that he only take a single 325 aspirin daily. I did advise the risk of taking more than that. He is given return precautions.

## 2016-08-21 ENCOUNTER — Telehealth: Payer: Self-pay | Admitting: Cardiovascular Disease

## 2016-08-21 NOTE — Telephone Encounter (Signed)
Iran OuchMuhammad A Arida, MD  Glori LuisEric G Sonnenberg, MD; Shon BatonSharon H Pressley Barsky, RN        The next step is either a Lexiscan nuclear stress test or cardiac cath.   Jasmine DecemberSharon,  Can you speak with him and see if he wants to reschedule stress test or consider a cardiac cath.   Thanks.    Left message on machine for patient to contact the office.

## 2016-08-22 NOTE — Telephone Encounter (Signed)
Left detailed message w/CB number on pt VM.

## 2016-08-25 ENCOUNTER — Telehealth: Payer: Self-pay | Admitting: Cardiovascular Disease

## 2016-08-25 NOTE — Telephone Encounter (Signed)
"  The next step is either a Lexiscan nuclear stress test or cardiac cath.  Can you speak with him and see if he wants to reschedule stress test or consider a cardiac cath."  Pt returned call and reports he does not want to proceed w/myoview but might consider a cardiac cath. States "at this point, I'm not sure this is where the chest pain is coming from". He thinks it could be r/t lungs as CP typically occurs at the beginning of his exercise and improves once he takes deep breaths. He would like to continue to monitor and discuss at Dec f/u visit w/Dr. Birdie SonsSonnenberg as he has no copay at PCP. He has a $35 copay w/cardiology. Advised pt to continue to monitor s/s and report worsening sx. Pt verbalized understanding and is agreeable w/plan.

## 2016-08-25 NOTE — Telephone Encounter (Signed)
Patient returning call from sharon.   Please call [phone].

## 2016-08-25 NOTE — Telephone Encounter (Signed)
Three attempts to contact patient. Letter mailed to home address.

## 2016-08-25 NOTE — Telephone Encounter (Signed)
Left detailed message requesting CB on pt VM

## 2016-08-25 NOTE — Telephone Encounter (Signed)
Ok

## 2016-09-26 ENCOUNTER — Other Ambulatory Visit: Payer: Self-pay | Admitting: Family Medicine

## 2016-10-31 ENCOUNTER — Telehealth: Payer: Self-pay | Admitting: Family Medicine

## 2016-10-31 MED ORDER — GEMFIBROZIL 600 MG PO TABS: 600.0000 mg | ORAL_TABLET | Freq: Two times a day (BID) | ORAL | 3 refills | Status: DC

## 2016-10-31 MED ORDER — ATORVASTATIN CALCIUM 10 MG PO TABS: 10.0000 mg | ORAL_TABLET | Freq: Every day | ORAL | 3 refills | Status: DC

## 2016-10-31 MED ORDER — GLIPIZIDE 10 MG PO TABS: 20.0000 mg | ORAL_TABLET | Freq: Two times a day (BID) | ORAL | 3 refills | Status: DC

## 2016-10-31 MED ORDER — METFORMIN HCL 850 MG PO TABS: ORAL_TABLET | ORAL | 3 refills | Status: DC

## 2016-10-31 NOTE — Telephone Encounter (Signed)
Is it ok to refill these medications? 

## 2016-10-31 NOTE — Telephone Encounter (Signed)
Refill sent to pharmacy of Lipitor, Lopid, Glucotrol, and metformin. Please confirm that he takes chlorthalidone and the dose. Thanks.

## 2016-10-31 NOTE — Telephone Encounter (Signed)
Pt called requesting a refill on atorvastatin (LIPITOR) 10 MG tablet, gemfibrozil (LOPID) 600 MG tablet, glipiZIDE (GLUCOTROL) 10 MG tablet, chlorthalidone (HYGROTON) 50 MG tablet, and metFORMIN (GLUCOPHAGE) 850 MG tablet. He would like a 90 day supply. Thank you!  [facility] [place] [id number]  Call pt @ [phone]

## 2016-11-01 NOTE — Telephone Encounter (Signed)
Tried to call patient but phone number is out of service

## 2016-11-14 NOTE — Telephone Encounter (Signed)
I have tried to call the patient a couple of times with no luck.

## 2016-11-17 ENCOUNTER — Encounter: Payer: Self-pay | Admitting: Family Medicine

## 2016-11-17 ENCOUNTER — Ambulatory Visit (INDEPENDENT_AMBULATORY_CARE_PROVIDER_SITE_OTHER): Payer: Medicare Other | Admitting: Family Medicine

## 2016-11-17 VITALS — BP 132/74 | HR 99 | Temp 97.8°F | Wt 237.0 lb

## 2016-11-17 DIAGNOSIS — I10 Essential (primary) hypertension: Secondary | ICD-10-CM | POA: Insufficient documentation

## 2016-11-17 DIAGNOSIS — M7540 Impingement syndrome of unspecified shoulder: Secondary | ICD-10-CM | POA: Diagnosis not present

## 2016-11-17 DIAGNOSIS — N478 Other disorders of prepuce: Secondary | ICD-10-CM | POA: Insufficient documentation

## 2016-11-17 DIAGNOSIS — E1142 Type 2 diabetes mellitus with diabetic polyneuropathy: Secondary | ICD-10-CM

## 2016-11-17 DIAGNOSIS — R079 Chest pain, unspecified: Secondary | ICD-10-CM | POA: Diagnosis not present

## 2016-11-17 DIAGNOSIS — R252 Cramp and spasm: Secondary | ICD-10-CM | POA: Diagnosis not present

## 2016-11-17 DIAGNOSIS — N471 Phimosis: Secondary | ICD-10-CM | POA: Insufficient documentation

## 2016-11-17 MED ORDER — TRIAMCINOLONE ACETONIDE 0.1 % EX CREA: 1.0000 "application " | TOPICAL_CREAM | Freq: Two times a day (BID) | CUTANEOUS | 0 refills | Status: DC | PRN

## 2016-11-17 MED ORDER — ACETAMINOPHEN 325 MG PO TABS: 650.0000 mg | ORAL_TABLET | Freq: Four times a day (QID) | ORAL | 1 refills | Status: DC | PRN

## 2016-11-17 NOTE — Assessment & Plan Note (Signed)
Patient's bilateral shoulder exam is most consistent with rotator cuff impingement. I discussed options of exercises at home versus physical therapy versus referral to sports medicine for consideration of injection. Patient opted for exercises at home. He will do these and continue to monitor.

## 2016-11-17 NOTE — Progress Notes (Signed)
[name], MD Phone: [phone]  Patrick Patel is a 75 y.o. male who presents today for follow-up.  Diabetes: Blood sugars range anywhere from 160-240. Takes metformin and glipizide. Can't afford his insulin. He notes some polyuria though no polydipsia. No hypoglycemia.  Reports blood pressures in the 140s over 80s when he does check. Taking chlorthalidone. Notes continued issues with chest pressure and shortness of breath. Occurs most days. Lasts for 5-10 minutes and resolves on its own or with exercise. Does not radiate. No diaphoresis with it. Notes it actually improves with activity. He was evaluated by cardiology and they recommended a nuclear stress test though he has declined this. He thinks this is not necessarily related to his heart though may be related to his lungs as he did smoke. He has no chest pain or shortness of breath at this time.  Bilateral shoulder pain: Patient notes he fell on his right shoulder about 3 years ago. He's had some intermittent discomfort in his right shoulder since then. Also discomfort in his left shoulder. Notes it can hurt when he moves them by abducting and can also bother him at rest. It is described as a dull ache. Does not radiate down his arm.  Patient additionally notes he uses triamcinolone cream on his foreskin as it cracks at times. He's been using this for 6-7 years. He declines an exam today. He notes no masses on his foreskin.   He additionally takes magnesium for muscle cramps that he gets in his legs. Since starting on magnesium he has not had any more cramps.  PMH: Former smoker   ROS see history of present illness  Objective  Physical Exam Vitals:   11/17/16 1551  BP: 132/74  Pulse: 99  Temp: 97.8 F (36.6 C)    BP Readings from Last 3 Encounters:  11/17/16 132/74  08/18/16 132/68  06/16/16 130/70   Wt Readings from Last 3 Encounters:  11/17/16 237 lb (107.5 kg)  08/18/16 239 lb 12.8 oz (108.8 kg)  06/16/16  241 lb (109.3 kg)    Physical Exam  Constitutional: No distress.  Cardiovascular: Normal rate, regular rhythm and normal heart sounds.   Pulmonary/Chest: Effort normal.  Musculoskeletal: He exhibits no edema.  Bilateral shoulders no tenderness or bony defects noted, patient with discomfort on active and passive internal rotation, external rotation, and abduction, discomfort on empty can testing bilaterally, 5 out of 5 strength on empty can testing, negative drop arm test  Neurological: He is alert. Gait normal.  Skin: Skin is warm and dry. He is not diaphoretic.     Assessment/Plan: Please see individual problem list.  Diabetes ([place]) Sugars wildly vary. Patient can't afford the appropriate medication at this time. We'll check an A1c today and determine the next step in management.  Chest pain Continues to have this intermittently. He is refusing any further cardiac workup at this time. He is more concerned with pulmonary cause. We will have him check with his insurance company to see if pulmonary function testing is covered. He is concerned about cost and wants to check with them first. He will let us know if he would like to proceed. He'll continue to monitor at his request. He is given return precautions.  Rotator cuff impingement syndrome Patient's bilateral shoulder exam is most consistent with rotator cuff impingement. I discussed options of exercises at home versus physical therapy versus referral to sports medicine for consideration of injection. Patient opted for exercises at home. He will do these  and continue to monitor.  Foreskin problem Patient reports cracking of the skin on his foreskin at times. Has used triamcinolone intermittently for 6-7 years. He declined exam today. I advised him that the next time he comes in he should be prepared for an exam. We will refill his triamcinolone at this time. If there are any changes he he will let us know.  Essential  hypertension Well-controlled in the office. Continue current medications.   Orders Placed This Encounter  Procedures  . HgB A1c  . Comp Met (CMET)  . Magnesium    Meds ordered this encounter  Medications  . DISCONTD: triamcinolone cream (KENALOG) 0.1 %    Sig: Apply 1 application topically 2 (two) times daily as needed.    Dispense:  30 g    Refill:  0  . triamcinolone cream (KENALOG) 0.1 %    Sig: Apply 1 application topically 2 (two) times daily as needed.    Dispense:  30 g    Refill:  0  . acetaminophen (TYLENOL) 325 MG tablet    Sig: Take 2 tablets (650 mg total) by mouth every 6 (six) hours as needed.    Dispense:  90 tablet    Refill:  1    [name], MD [place]

## 2016-11-17 NOTE — Assessment & Plan Note (Signed)
Sugars wildly vary. Patient can't afford the appropriate medication at this time. We'll check an A1c today and determine the next step in management.

## 2016-11-17 NOTE — Assessment & Plan Note (Signed)
Patient reports cracking of the skin on his foreskin at times. Has used triamcinolone intermittently for 6-7 years. He declined exam today. I advised him that the next time he comes in he should be prepared for an exam. We will refill his triamcinolone at this time. If there are any changes he he will let us know.

## 2016-11-17 NOTE — Patient Instructions (Signed)
Nice to see you. We are going to send in triamcinolone for use. We are going to order lab work today and call you with the results. Please do the following exercises for your shoulders. If you develop persistent chest pain or shortness of breath please seek medical attention medially.   Shoulder Impingement Syndrome Rehab Ask your health care provider which exercises are safe for you. Do exercises exactly as told by your health care provider and adjust them as directed. It is normal to feel mild stretching, pulling, tightness, or discomfort as you do these exercises, but you should stop right away if you feel sudden pain or your pain gets worse.Do not begin these exercises until told by your health care provider. Stretching and range of motion exercise This exercise warms up your muscles and joints and improves the movement and flexibility of your shoulder. This exercise also helps to relieve pain and stiffness. Exercise A: Passive horizontal adduction 1. Sit or stand and pull your left / right elbow across your chest, toward your other shoulder. Stop when you feel a gentle stretch in the back of your shoulder and upper arm.  Keep your arm at shoulder height.  Keep your arm as close to your body as you comfortably can. 2. Hold for __________ seconds. 3. Slowly return to the starting position. Repeat __________ times. Complete this exercise __________ times a day. Strengthening exercises These exercises build strength and endurance in your shoulder. Endurance is the ability to use your muscles for a long time, even after they get tired. Exercise B: External rotation, isometric 1. Stand or sit in a doorway, facing the door frame. 2. Bend your left / right elbow and place the back of your wrist against the door frame. Only your wrist should be touching the frame. Keep your upper arm at your side. 3. Gently press your wrist against the door frame, as if you are trying to push your arm away from  your abdomen.  Avoid shrugging your shoulder while you press your hand against the door frame. Keep your shoulder blade tucked down toward the middle of your back. 4. Hold for __________ seconds. 5. Slowly release the tension, and relax your muscles completely before you do the exercise again. Repeat __________ times. Complete this exercise __________ times a day. Exercise C: Internal rotation, isometric 1. Stand or sit in a doorway, facing the door frame. 2. Bend your left / right elbow and place the inside of your wrist against the door frame. Only your wrist should be touching the frame. Keep your upper arm at your side. 3. Gently press your wrist against the door frame, as if you are trying to push your arm toward your abdomen.  Avoid shrugging your shoulder while you press your hand against the door frame. Keep your shoulder blade tucked down toward the middle of your back. 4. Hold for __________ seconds. 5. Slowly release the tension, and relax your muscles completely before you do the exercise again. Repeat __________ times. Complete this exercise __________ times a day. Exercise D: Scapular protraction, supine 1. Lie on your back on a firm surface. Hold a __________ weight in your left / right hand. 2. Raise your left / right arm straight into the air so your hand is directly above your shoulder joint. 3. Push the weight into the air so your shoulder lifts off of the surface that you are lying on. Do not move your head, neck, or back. 4. Hold for __________ seconds. 5.  Slowly return to the starting position. Let your muscles relax completely before you repeat this exercise. Repeat __________ times. Complete this exercise __________ times a day. Exercise E: Scapular retraction 1. Sit in a stable chair without armrests, or stand. 2. Secure an exercise band to a stable object in front of you so the band is at shoulder height. 3. Hold one end of the exercise band in each hand. Your palms  should face down. 4. Squeeze your shoulder blades together and move your elbows slightly behind you. Do not shrug your shoulders while you do this. 5. Hold for __________ seconds. 6. Slowly return to the starting position. Repeat __________ times. Complete this exercise __________ times a day. Exercise F: Shoulder extension 1. Sit in a stable chair without armrests, or stand. 2. Secure an exercise band to a stable object in front of you where the band is above shoulder height. 3. Hold one end of the exercise band in each hand. 4. Straighten your elbows and lift your hands up to shoulder height. 5. Squeeze your shoulder blades together and pull your hands down to the sides of your thighs. Stop when your hands are straight down by your sides. Do not let your hands go behind your body. 6. Hold for __________ seconds. 7. Slowly return to the starting position. Repeat __________ times. Complete this exercise __________ times a day. This information is not intended to replace advice given to you by your health care provider. Make sure you discuss any questions you have with your health care provider. Document Released: 11/27/2005 Document Revised: 08/03/2016 Document Reviewed: 10/30/2015 Elsevier Interactive Patient Education  2017 [employer]

## 2016-11-17 NOTE — Assessment & Plan Note (Signed)
Continues to have this intermittently. He is refusing any further cardiac workup at this time. He is more concerned with pulmonary cause. We will have him check with his insurance company to see if pulmonary function testing is covered. He is concerned about cost and wants to check with them first. He will let us know if he would like to proceed. He'll continue to monitor at his request. He is given return precautions.

## 2016-11-17 NOTE — Assessment & Plan Note (Signed)
Well-controlled in the office. Continue current medications.

## 2016-11-17 NOTE — Progress Notes (Signed)
Pre visit review using our clinic review tool, if applicable. No additional management support is needed unless otherwise documented below in the visit note. 

## 2016-11-18 LAB — COMPREHENSIVE METABOLIC PANEL
ALBUMIN: 4.5 g/dL (ref 3.6–5.1)
ALT: 14 U/L (ref 9–46)
AST: 16 U/L (ref 10–35)
Alkaline Phosphatase: 68 U/L (ref 40–115)
BUN: 25 mg/dL (ref 7–25)
CALCIUM: 9.7 mg/dL (ref 8.6–10.3)
CHLORIDE: 102 mmol/L (ref 98–110)
CO2: 23 mmol/L (ref 20–31)
CREATININE: 0.85 mg/dL (ref 0.70–1.18)
Glucose, Bld: 196 mg/dL — ABNORMAL HIGH (ref 65–99)
POTASSIUM: 4.3 mmol/L (ref 3.5–5.3)
Sodium: 140 mmol/L (ref 135–146)
TOTAL PROTEIN: 7.5 g/dL (ref 6.1–8.1)
Total Bilirubin: 0.4 mg/dL (ref 0.2–1.2)

## 2016-11-18 LAB — HEMOGLOBIN A1C
Hgb A1c MFr Bld: 6.9 % — ABNORMAL HIGH (ref <5.7)
Mean Plasma Glucose: 151 mg/dL

## 2016-11-18 LAB — MAGNESIUM: MAGNESIUM: 1.6 mg/dL (ref 1.5–2.5)

## 2016-12-12 ENCOUNTER — Telehealth: Payer: Self-pay | Admitting: Family Medicine

## 2016-12-12 NOTE — Telephone Encounter (Signed)
Please advise 

## 2016-12-12 NOTE — Telephone Encounter (Signed)
Pt called and stated that he needs a new prescription for a new glucometer Accu Check Aviva Plus. His old one broke. He will also need test strips for this as well. Please advise, thank you!  Call pt @ [phone]   Pharmacy- [facility] [address] - [place], [place] - [id number]

## 2016-12-14 MED ORDER — BLOOD GLUCOSE MONITOR KIT: PACK | 0 refills | Status: DC

## 2016-12-14 NOTE — Telephone Encounter (Signed)
Please fax to [facility]. I accidentally sent this to his mail order. Please contact them and cancel the order. Thanks.

## 2016-12-15 NOTE — Telephone Encounter (Signed)
Cancelled with mail order, verified [facility] does have rx

## 2016-12-21 ENCOUNTER — Other Ambulatory Visit: Payer: Self-pay

## 2016-12-21 NOTE — Telephone Encounter (Signed)
Filed under historical provider  

## 2016-12-21 NOTE — Telephone Encounter (Signed)
Please confirm with patient that he is taking this medication.

## 2016-12-22 MED ORDER — CHLORTHALIDONE 50 MG PO TABS: 50.0000 mg | ORAL_TABLET | Freq: Every day | ORAL | 1 refills | Status: DC

## 2016-12-22 NOTE — Telephone Encounter (Signed)
Left message to return call 

## 2016-12-22 NOTE — Telephone Encounter (Signed)
Patient states he is taking this, 50mg  once a day

## 2016-12-22 NOTE — Telephone Encounter (Signed)
Sent to pharmacy 

## 2017-02-16 ENCOUNTER — Encounter: Payer: Self-pay | Admitting: Family Medicine

## 2017-02-16 ENCOUNTER — Ambulatory Visit (INDEPENDENT_AMBULATORY_CARE_PROVIDER_SITE_OTHER): Payer: Medicare Other | Admitting: Family Medicine

## 2017-02-16 VITALS — BP 112/60 | HR 87 | Temp 97.6°F | Wt 232.0 lb

## 2017-02-16 DIAGNOSIS — L0390 Cellulitis, unspecified: Secondary | ICD-10-CM | POA: Insufficient documentation

## 2017-02-16 DIAGNOSIS — R300 Dysuria: Secondary | ICD-10-CM

## 2017-02-16 DIAGNOSIS — L03115 Cellulitis of right lower limb: Secondary | ICD-10-CM

## 2017-02-16 DIAGNOSIS — E119 Type 2 diabetes mellitus without complications: Secondary | ICD-10-CM | POA: Diagnosis not present

## 2017-02-16 LAB — POCT URINALYSIS DIPSTICK
Bilirubin, UA: NEGATIVE
Glucose, UA: NEGATIVE
Ketones, UA: NEGATIVE
Nitrite, UA: NEGATIVE
PROTEIN UA: POSITIVE
RBC UA: POSITIVE
SPEC GRAV UA: 1.02
UROBILINOGEN UA: 0.2
pH, UA: 6.5

## 2017-02-16 LAB — POCT GLYCOSYLATED HEMOGLOBIN (HGB A1C): HEMOGLOBIN A1C: 7.9

## 2017-02-16 MED ORDER — CEPHALEXIN 500 MG PO CAPS: 500.0000 mg | ORAL_CAPSULE | Freq: Four times a day (QID) | ORAL | 0 refills | Status: DC

## 2017-02-16 NOTE — Progress Notes (Signed)
Marikay AlarEric Casmir Auguste, MD Phone: [phone]  Valeria D Faustino CongressGaster is a 76 y.o. male who presents today for follow-up.  Diabetes: Patient notes mostly less than 200 that was up to 360 last night. He is taking glipizide and metformin. He notes no polyuria or polydipsia. No hypoglycemia.  Patient notes about a days of dysuria. Notes fair amount of discomfort when he urinates. Minimal straining. Does have increased frequency and some urgency. No history of kidney stones. No hematuria. No flank pain or back pain. No fevers or chills. He does note with this he has been having increased bowel movements that are not diarrhea. He notes minimal blood and irritation around the rectum when he wipes.  Patient notes likely cellulitis on his right lateral lower leg. He had a scab there that he picked off and there is been erythema around it since then. He is unsure if the erythema has been spreading of these he is not able to see this area very well. It is tender at times and does itch. No fevers.  PMH: Former smoker   ROS see history of present illness  Objective  Physical Exam Vitals:   02/16/17 1513  BP: 112/60  Pulse: 87  Temp: 97.6 F (36.4 C)    BP Readings from Last 3 Encounters:  02/16/17 112/60  11/17/16 132/74  08/18/16 132/68   Wt Readings from Last 3 Encounters:  02/16/17 232 lb (105.2 kg)  11/17/16 237 lb (107.5 kg)  08/18/16 239 lb 12.8 oz (108.8 kg)    Physical Exam  Constitutional: No distress.  Cardiovascular: Normal rate, regular rhythm and normal heart sounds.   Pulmonary/Chest: Effort normal and breath sounds normal.  Abdominal: Soft. Bowel sounds are normal. He exhibits no distension. There is no tenderness. There is no rebound and no guarding.  Genitourinary:  Genitourinary Comments: Area around rectum appears irritated, no bleeding, normal prostate with no tenderness or bogginess, no blood on the glove, no stool noted in the rectal vault  Neurological: He is alert. Gait  normal.  Skin: Skin is warm and dry. He is not diaphoretic.  Lateral lower extremity with scab with surrounding erythema with induration and warmth, no fluctuance, mild tenderness     Assessment/Plan: Please see individual problem list.  Dysuria Patient potentially with UTI. No other symptoms consistent with kidney stone and he has no history. Prostate exam was normal thus doubt prostatitis. We will start on Keflex as this will provide coverage of his cellulitis and possible UTI. Will send urine for culture and microscopy. He is given return precautions. Area around his rectum was irritated likely from wiping exuberantly. Suspect this is where the bleeding is from though we will give him stool cards to evaluate for blood. He had no stool in his rectal vault and thus could not perform FOBT in the office.  Cellulitis Area on right lower extremity concerning for cellulitis. Will start on Keflex 4 times daily for this. He'll return early next week for recheck. He is given return precautions.  Diabetes ([facility]) A1c slightly worse than previously at 7.9. He has very significant cost concerns regarding medicines for diabetes. He will continue his current medications and we will send a message to our pharmacist to see if he can provide any insight given the patient's health insurance to see what the next option is for his diabetes.   Orders Placed This Encounter  Procedures  . Urine Culture  . Urine Microscopic Only  . POCT Urinalysis Dipstick  . POCT HgB A1C  Meds ordered this encounter  Medications  . cephALEXin (KEFLEX) 500 MG capsule    Sig: Take 1 capsule (500 mg total) by mouth 4 (four) times daily.    Dispense:  20 capsule    Refill:  0    Marikay Alar, MD [facility]

## 2017-02-16 NOTE — Assessment & Plan Note (Addendum)
Patient potentially with UTI. No other symptoms consistent with kidney stone and he has no history. Prostate exam was normal thus doubt prostatitis. We will start on Keflex as this will provide coverage of his cellulitis and possible UTI. Will send urine for culture and microscopy. He is given return precautions. Area around his rectum was irritated likely from wiping exuberantly. Suspect this is where the bleeding is from though we will give him stool cards to evaluate for blood. He had no stool in his rectal vault and thus could not perform FOBT in the office.

## 2017-02-16 NOTE — Assessment & Plan Note (Signed)
A1c slightly worse than previously at 7.9. He has very significant cost concerns regarding medicines for diabetes. He will continue his current medications and we will send a message to our pharmacist to see if he can provide any insight given the patient's health insurance to see what the next option is for his diabetes.

## 2017-02-16 NOTE — Patient Instructions (Addendum)
Nice to see you. We will start you on Keflex for possible UTI and this will cover for the cellulitis in your right leg. We will have a return early next week for recheck. We will have a complete stool cards to evaluate for blood. If you develop abdominal pain, increased blood in your stool, fevers, spreading redness, worsening pain in her leg, or any new or changing symptoms please seek medical attention medially.

## 2017-02-16 NOTE — Assessment & Plan Note (Signed)
Area on right lower extremity concerning for cellulitis. Will start on Keflex 4 times daily for this. He'll return early next week for recheck. He is given return precautions.

## 2017-02-16 NOTE — Progress Notes (Signed)
Pre visit review using our clinic review tool, if applicable. No additional management support is needed unless otherwise documented below in the visit note. 

## 2017-02-17 LAB — URINALYSIS, MICROSCOPIC ONLY
CASTS: NONE SEEN [LPF]
Crystals: NONE SEEN [HPF]
Squamous Epithelial / LPF: NONE SEEN [HPF] (ref <=5)
WBC, UA: >60 WBC/HPF — AB (ref <=5)
YEAST: NONE SEEN [HPF]

## 2017-02-19 LAB — URINE CULTURE

## 2017-02-19 NOTE — Addendum Note (Signed)
Addended by: Hazle NordmannOMBS, KELSY E on: 02/19/2017 12:58 PM   Modules accepted: Orders

## 2017-02-19 NOTE — Progress Notes (Signed)
[facility].  I reviewed his plan benefits and his options for Tier 1 & 2 medications for diabetes are limited given his pioglitazone allergy and being at max doses/max tolerated doses of metformin and sulfonylurea.  His other antidiabetic options are likely Tier 3 including all insulins, GLP-1s, DPP4 inhibitors and SGLT2s.  His tier 3 medications would have a $45 copay.  I have addended your chart after our discussion today to place a [facility] as the financial documentation does take more time and may require a home visit.  I will plan to assess patient eligibility for Low Income Subsidy/Extra help and manufacturer patient assistance regardless if he is [facility] eligible but that does make it easier for me rather than requiring him to come in for an office visit.    Hazle NordmannKelsy Combs, PharmD, BCPS [facility] PGY2 Pharmacy Resident [phone]

## 2017-02-20 ENCOUNTER — Telehealth: Payer: Self-pay | Admitting: *Deleted

## 2017-02-20 ENCOUNTER — Ambulatory Visit (INDEPENDENT_AMBULATORY_CARE_PROVIDER_SITE_OTHER): Payer: Medicare Other | Admitting: Family Medicine

## 2017-02-20 ENCOUNTER — Encounter: Payer: Self-pay | Admitting: Family Medicine

## 2017-02-20 DIAGNOSIS — R197 Diarrhea, unspecified: Secondary | ICD-10-CM

## 2017-02-20 DIAGNOSIS — L03115 Cellulitis of right lower limb: Secondary | ICD-10-CM

## 2017-02-20 DIAGNOSIS — R300 Dysuria: Secondary | ICD-10-CM

## 2017-02-20 MED ORDER — CEPHALEXIN 500 MG PO CAPS: 500.0000 mg | ORAL_CAPSULE | Freq: Four times a day (QID) | ORAL | 0 refills | Status: DC

## 2017-02-20 MED ORDER — SULFAMETHOXAZOLE-TRIMETHOPRIM 800-160 MG PO TABS: 1.0000 | ORAL_TABLET | Freq: Two times a day (BID) | ORAL | 0 refills | Status: DC

## 2017-02-20 NOTE — Assessment & Plan Note (Signed)
Reports 2 episodes of diarrhea. Responded well to antidiarrheal medication. No diarrhea now. Discussed taking a probiotic or eating yogurt while on the antibiotics particularly given that we are adding an additional antibiotic. If diarrhea recurs or is persistent or he develops new symptoms he'll be reevaluated.

## 2017-02-20 NOTE — Progress Notes (Signed)
  Patrick AlarEric Lawerence Dery, MD Phone: [phone]  Patrick Patel is a 76 y.o. male who presents today for follow-up.  Patient was seen last week and diagnosed with cellulitis and UTI. He notes the discomfort in the leg is somewhat improved. The redness is mildly improved. He has not had any fevers. No spreading redness. He's been taking Keflex for 4 days. He notes his dysuria is improved about 20%. Occasionally will not hurt at all and then it will hurt significantly. Has not passed any stones. No hematuria. No abdominal pain. Continues to have frequency and urgency. He notes he had 2 bouts of diarrhea with the antibiotic. No visible blood. No abdominal pain. Notes he took antidiarrheal pills and this helped. He has not been taking a probiotic or eating yogurt.  PMH: Former smoker   ROS see history of present illness  Objective  Physical Exam Vitals:   02/20/17 1342  BP: 132/70  Pulse: 84  Temp: 98.1 F (36.7 C)    BP Readings from Last 3 Encounters:  02/20/17 132/70  02/16/17 112/60  11/17/16 132/74   Wt Readings from Last 3 Encounters:  02/16/17 232 lb (105.2 kg)  11/17/16 237 lb (107.5 kg)  08/18/16 239 lb 12.8 oz (108.8 kg)    Physical Exam  Constitutional: No distress.  Cardiovascular: Normal rate, regular rhythm and normal heart sounds.   Pulmonary/Chest: Effort normal.  Abdominal: Soft. Bowel sounds are normal. He exhibits no distension. There is no tenderness.  Musculoskeletal: He exhibits no edema.  Neurological: He is alert. Gait normal.  Skin: Skin is warm and dry. He is not diaphoretic.  Area of erythema previously noted with centralized scab in the lateral right lower extremity appears similarly erythematous though has no induration or tenderness now, no fluctuance     Assessment/Plan: Please see individual problem list.  Dysuria Continues to have issues with this. We had to call the lab to obtain the urine culture. It appears this is resistant to cefazolin. He  again provides his list of antibiotics that he can afford and it includes Keflex, amoxicillin, ciprofloxacin, and Bactrim. Given sensitivity to Bactrim and that the erythema from cellulitis has been slow to improve we will add Bactrim to treat UTI and cellulitis. Continue to monitor and if not improving he will follow-up.  Cellulitis Area in right lower extremity continues to appear erythematous though induration and tenderness are resolved. We'll add coverage for MRSA given slow to improve erythema. He'll continue Keflex to provide coverage for strep. Recheck on Friday. Given return precautions.  Diarrhea Reports 2 episodes of diarrhea. Responded well to antidiarrheal medication. No diarrhea now. Discussed taking a probiotic or eating yogurt while on the antibiotics particularly given that we are adding an additional antibiotic. If diarrhea recurs or is persistent or he develops new symptoms he'll be reevaluated.   No orders of the defined types were placed in this encounter.   Meds ordered this encounter  Medications  . sulfamethoxazole-trimethoprim (BACTRIM DS) 800-160 MG tablet    Sig: Take 1 tablet by mouth 2 (two) times daily.    Dispense:  10 tablet    Refill:  0  . cephALEXin (KEFLEX) 500 MG capsule    Sig: Take 1 capsule (500 mg total) by mouth 4 (four) times daily.    Dispense:  20 capsule    Refill:  0    Patrick AlarEric Tiegan Terpstra, MD [facility]

## 2017-02-20 NOTE — Progress Notes (Signed)
Pre visit review using our clinic review tool, if applicable. No additional management support is needed unless otherwise documented below in the visit note. 

## 2017-02-20 NOTE — Addendum Note (Signed)
Addended by: Warden FillersWRIGHT, Idrees Quam S on: 02/20/2017 04:02 PM   Modules accepted: Orders

## 2017-02-20 NOTE — Telephone Encounter (Signed)
Pt dropped off a IFOB today. Please place an order

## 2017-02-20 NOTE — Assessment & Plan Note (Addendum)
Area in right lower extremity continues to appear erythematous though induration and tenderness are resolved. We'll add coverage for MRSA given slow to improve erythema. He'll continue Keflex to provide coverage for strep. Recheck on Friday. Given return precautions.

## 2017-02-20 NOTE — Patient Instructions (Signed)
Nice to see you. You should start taking a probiotic or eat some yogurt while you're on the antibiotic to limit the amount of diarrhea you get. We are going to add Bactrim for to take to cover for UTI. This will additionally help cover your skin infection. He'll continue the Keflex. If you develop fevers, spreading redness, abdominal pain, or any new or changing symptoms please seek medical attention immediately.

## 2017-02-20 NOTE — Telephone Encounter (Signed)
I went ahead & placed the order because the courier was here.

## 2017-02-20 NOTE — Assessment & Plan Note (Signed)
Continues to have issues with this. We had to call the lab to obtain the urine culture. It appears this is resistant to cefazolin. He again provides his list of antibiotics that he can afford and it includes Keflex, amoxicillin, ciprofloxacin, and Bactrim. Given sensitivity to Bactrim and that the erythema from cellulitis has been slow to improve we will add Bactrim to treat UTI and cellulitis. Continue to monitor and if not improving he will follow-up.

## 2017-02-21 ENCOUNTER — Telehealth: Payer: Self-pay | Admitting: Family Medicine

## 2017-02-21 MED ORDER — CIPROFLOXACIN HCL 250 MG PO TABS: 250.0000 mg | ORAL_TABLET | Freq: Two times a day (BID) | ORAL | 0 refills | Status: DC

## 2017-02-21 NOTE — Telephone Encounter (Signed)
Noted. Cipro sent to pharmacy. CMA did advise patient to be evaluated if worsens.

## 2017-02-21 NOTE — Telephone Encounter (Signed)
Pt called and stated that he only took one pill of the antibiotics that Dr. Birdie SonsSonnenberg prescribed yesterday. Pt states that he had a reaction, he is c/o lips swelling, sore mouth, and rash across his chest. Please advise, thank you!  Call pt @ [phone]

## 2017-02-21 NOTE — Addendum Note (Signed)
Addended by: Glori LuisSONNENBERG, Biviana Saddler G on: 02/21/2017 04:42 PM   Modules accepted: Orders

## 2017-02-21 NOTE — Telephone Encounter (Signed)
Please advise 

## 2017-02-21 NOTE — Telephone Encounter (Signed)
Please confirm that it was the bactrim. Patient should discontinue the bactrim. Please find out if he is still having these symptoms. If so he needs to be evaluated. We will need to prescribe a new antibiotic for his UTI. He had a list of antibiotics he could afford yesterday. Please confirm the names of the antibiotics on that list. I believe one of them was ciprofloxacin. Thanks.

## 2017-02-21 NOTE — Telephone Encounter (Signed)
Pt was called and told that Dr.Sonnenberg would like for him to be evaluated for swelling pt refused stating, "he did not want to go anywhere else."  Pt stated that she swelling has gradually went down over the course of the day. Pt was advised to take benadryl to see if it would help. He did not want to be on two anti-biotics at the same time stated it scared him to do that. Pt said you could send it in and would start once keflex was completed.

## 2017-02-21 NOTE — Telephone Encounter (Signed)
Patient should be evaluated today given that he has continued to have numbness and swelling in his mouth. He can take a dose of Benadryl to see if that is beneficial for the itching and swelling though I would recommend evaluation at urgent care or walk-in clinic. I can send in ciprofloxacin for him if he is willing to take 2 antibiotics at the same time, if he is not willing to take 2 at the same time he will need to complete the course of Keflex and then started on ciprofloxacin. Thanks.

## 2017-02-21 NOTE — Telephone Encounter (Signed)
Patient states after taking one dose 3 hours later his lips got numb and swollen, patient states he also has a rash on his shoulder. Patient reports some numbness and swelling at the front of his mouth today as well as itching all over. Patient states he will not take two antibiotics at once. He states cipro is on the list.

## 2017-02-22 ENCOUNTER — Other Ambulatory Visit (INDEPENDENT_AMBULATORY_CARE_PROVIDER_SITE_OTHER): Payer: Medicare Other

## 2017-02-22 DIAGNOSIS — R079 Chest pain, unspecified: Secondary | ICD-10-CM

## 2017-02-22 DIAGNOSIS — K625 Hemorrhage of anus and rectum: Secondary | ICD-10-CM

## 2017-02-22 LAB — FECAL OCCULT BLOOD, IMMUNOCHEMICAL: Fecal Occult Bld: NEGATIVE

## 2017-02-26 ENCOUNTER — Ambulatory Visit (INDEPENDENT_AMBULATORY_CARE_PROVIDER_SITE_OTHER): Payer: Medicare Other | Admitting: Family Medicine

## 2017-02-26 ENCOUNTER — Telehealth: Payer: Self-pay

## 2017-02-26 ENCOUNTER — Encounter: Payer: Self-pay | Admitting: Family Medicine

## 2017-02-26 DIAGNOSIS — R300 Dysuria: Secondary | ICD-10-CM

## 2017-02-26 DIAGNOSIS — L03115 Cellulitis of right lower limb: Secondary | ICD-10-CM

## 2017-02-26 LAB — POCT URINALYSIS DIPSTICK
BILIRUBIN UA: NEGATIVE
Blood, UA: NEGATIVE
GLUCOSE UA: NEGATIVE
KETONES UA: NEGATIVE
Leukocytes, UA: NEGATIVE
Nitrite, UA: NEGATIVE
Protein, UA: NEGATIVE
Spec Grav, UA: 1.015 (ref 1.030–1.035)
Urobilinogen, UA: 0.2 (ref ?–2.0)
pH, UA: 5.5 (ref 5.0–8.0)

## 2017-02-26 MED ORDER — TRIAMCINOLONE ACETONIDE 0.1 % EX CREA: 1.0000 "application " | TOPICAL_CREAM | Freq: Two times a day (BID) | CUTANEOUS | 0 refills | Status: DC | PRN

## 2017-02-26 NOTE — Assessment & Plan Note (Signed)
Improved to some degree. Notes minimal dysuria currently. He would like to recheck his urine prior to starting on the ciprofloxacin. UA will be rechecked and we will determine if he requires treatment with an additional antibiotic.

## 2017-02-26 NOTE — Progress Notes (Signed)
  Patrick AlarEric Peder Allums, MD Phone: [phone]  Patrick Patel is a 76 y.o. male who presents today for follow-up.  Patient seen today in follow-up for cellulitis. Notes he took a total of 8 days of Keflex. He notes the redness has not spread. Has not appreciably improved though he has no pain or warmth in the area. No tenderness. He thinks it may be related to venous stasis changes.  His dysuria is quite a bit less. He does note some straining. He would like his urine rechecked. He has not started on the Cipro. He notes he had a allergic reaction to the Bactrim. Developed a rash and itching across his chest and neck. Notes his lips had a little bit of swelling and tingling though no throat swelling or tongue swelling. Notes these symptoms have resolved.  ROS see history of present illness  Objective  Physical Exam Vitals:   02/26/17 1309  BP: (!) 144/70  Pulse: 87  Temp: 98.1 F (36.7 C)    BP Readings from Last 3 Encounters:  02/26/17 (!) 144/70  02/20/17 132/70  02/16/17 112/60   Wt Readings from Last 3 Encounters:  02/16/17 232 lb (105.2 kg)  11/17/16 237 lb (107.5 kg)  08/18/16 239 lb 12.8 oz (108.8 kg)    Physical Exam  Constitutional: No distress.  HENT:  Mouth/Throat: Oropharynx is clear and moist. No oropharyngeal exudate.  No lip swelling, tongue swelling, or throat swelling apparent on exam, no rash noted around neck or upper chest  Abdominal: Soft. He exhibits no distension. There is no tenderness. There is no rebound and no guarding.  Skin: Skin is warm and dry. He is not diaphoretic.  Area of erythema on right lower extremity lateral aspect above the ankle appears to be similar in size to previous. Similar level of erythema as well, no induration, no tenderness, no fluctuance     Assessment/Plan: Please see individual problem list.  Cellulitis Symptoms have not worsened since he has come off of the antibiotics. Suspect this more likely represents venous stasis  dermatitis given lack of worsening after coming off the antibiotics and lack of induration and spreading redness and warmth at this time. He does have a history of venous stasis. Patient additionally does not feel it is infected. He has no systemic signs of infection. Discussed topical steroids to see if that would be beneficial. Discussed if he develops any signs of infection such as spreading redness, pain, warmth, or fevers he should be reevaluated.  Dysuria Improved to some degree. Notes minimal dysuria currently. He would like to recheck his urine prior to starting on the ciprofloxacin. UA will be rechecked and we will determine if he requires treatment with an additional antibiotic.  Patient additionally had an allergic reaction to the Bactrim. He is currently asymptomatic. This will be added to his allergy list.  No orders of the defined types were placed in this encounter.   Meds ordered this encounter  Medications  . triamcinolone cream (KENALOG) 0.1 %    Sig: Apply 1 application topically 2 (two) times daily as needed.    Dispense:  30 g    Refill:  0    Patrick AlarEric Anaisabel Pederson, MD [facility]

## 2017-02-26 NOTE — Patient Instructions (Signed)
Nice to see you. Please monitor the areas of redness. I believe they are venous stasis dermatitis. We will try topical steroid on them. We will recheck your urine. Please make sure nobody prescribes Bactrim in the future. If he develops spreading redness or fevers please seek medical attention immediately.

## 2017-02-26 NOTE — Telephone Encounter (Signed)
-----   Message from Glori LuisEric G Sonnenberg, MD sent at 02/26/2017  3:22 PM EDT ----- Please let the patient know that his urine was not consistent with infection. We will send it for culture to ensure that there is no infection. He should hold off on taking the ciprofloxacin until we know what the culture results as. Thanks.

## 2017-02-26 NOTE — Telephone Encounter (Signed)
Patient notified

## 2017-02-26 NOTE — Assessment & Plan Note (Signed)
Symptoms have not worsened since he has come off of the antibiotics. Suspect this more likely represents venous stasis dermatitis given lack of worsening after coming off the antibiotics and lack of induration and spreading redness and warmth at this time. He does have a history of venous stasis. Patient additionally does not feel it is infected. He has no systemic signs of infection. Discussed topical steroids to see if that would be beneficial. Discussed if he develops any signs of infection such as spreading redness, pain, warmth, or fevers he should be reevaluated.

## 2017-02-26 NOTE — Progress Notes (Signed)
Pre visit review using our clinic review tool, if applicable. No additional management support is needed unless otherwise documented below in the visit note. 

## 2017-02-26 NOTE — Addendum Note (Signed)
Addended by: Glori LuisSONNENBERG, Yosef Krogh G on: 02/26/2017 03:22 PM   Modules accepted: Orders

## 2017-02-27 LAB — URINE CULTURE: Organism ID, Bacteria: NO GROWTH

## 2017-03-01 ENCOUNTER — Telehealth: Payer: Self-pay

## 2017-03-01 NOTE — Telephone Encounter (Signed)
-----   Message from Glori LuisEric G Sonnenberg, MD sent at 02/28/2017  7:20 PM EDT ----- Please let the patient know that the urine culture did not reveal an infection. He does not need to start on ciprofloxacin. Thanks.

## 2017-03-01 NOTE — Telephone Encounter (Signed)
Patient advised of below and verbalized an understanding  

## 2017-03-01 NOTE — Telephone Encounter (Signed)
Tried calling, number unavailable will try later

## 2017-03-20 ENCOUNTER — Other Ambulatory Visit: Payer: Self-pay | Admitting: Pharmacist

## 2017-03-20 NOTE — Patient Outreach (Addendum)
[facility] ([facility]) Care Management  03/20/2017  Patrick Patel 07/07/1941 [id number]  75 y.o. year old male referred to [facility] pharmacy for Medication Assistance (Pharmacy Telephone Visit) Was able to reach patient via telephone today to discuss medication assistance.    Patient states that he has previously applied for SSI but was denied due to ownership of land other than where he is living.  Patient currently lives with his sister but has limited social security income.  He is currently receiving his medications from mail order with $0 copay.  He has previously went to a low income clinic in [place] but is unable to afford the $20 copay and he is also unable to afford the relion brand insulin from [facility] for $25 copay.   Assessment: Medication Assistance: [employer] medicare patient that is potential candidate for low income subsidy or manufacturer patient assistance.  Patient has not met out of pocket spend required for many diabetes manufacturers ($1000 or 5% total income) for medications. Patient unable to afford copay for Tier 3 medications or [facility] over the counter insulin. Patient is currently on max dose/max tolerated dose of sulfonylurea and metformin.  He has previous edema with TZD (Actos and Avandia).    Plan: -Will plan to complete Extra Help/Low income subsidy application (will likely be denied due to assets but is required for manufacturer patient assistance) -Will contact Dr Sonnenberg Jardiance from [employer] application. (No required out of pocket spend) -Will call patient back in 1-3 days to discuss and complete medication assistance applications  Kelsy Combs, PharmD, BCPS [facility] PGY2 Pharmacy Resident [phone]   

## 2017-03-23 ENCOUNTER — Other Ambulatory Visit: Payer: Self-pay | Admitting: Pharmacist

## 2017-03-23 NOTE — Patient Outreach (Signed)
[facility]) Care Management  03/23/2017  Patrick Patel July 01, 1941 [id number]   76 y.o. year old male referred to [facility] [place] [facility] for Medication Assistance (Pharmacy Telephone Visit) Was able to reach patient via telephone today to discuss medication assistance.    Patient states that he has previously applied for SSI but was denied due to ownership of land other than where he is living.  Patient currently lives with his sister but has limited social security income.  He is currently receiving his medications from mail order with $0 copay.  He has previously went to a low income clinic in [facility] but is unable to afford the $20 copay and he is also unable to afford the relion brand insulin from [facility] for $25 copay.   Assessment: Medication Assistance: [name] patient that is potential candidate for low income subsidy or manufacturer patient assistance.  Patient has not met out of pocket spend required for many diabetes manufacturers ($1000 or 5% total income) for medications. Patient unable to afford copay for Tier 3 medications or [facility] over the counter insulin. Patient is currently on max dose/max tolerated dose of sulfonylurea and metformin.  He has previous edema with TZD (Actos and Avandia).     Plan: -Completed Extra Help/Low income subsidy application (will likely be denied due to assets but is required for manufacturer patient assistance) -Will contact Dr Myrlene Broker from [employer] application. (No required out of pocket spend).  Have faxed application to his office -Discussed case with [facility] social worker - due to assets patient is likely not candidate for medicaid, low income subsidy, or other government benefits.  -Will call patient back in 7-14 days to discuss and complete medication assistance application  Bennye Alm, PharmD, [place] PGY2 Pharmacy Resident [phone]

## 2017-03-26 ENCOUNTER — Telehealth: Payer: Self-pay

## 2017-03-26 ENCOUNTER — Ambulatory Visit: Payer: Medicare Other | Admitting: Pharmacist

## 2017-03-26 ENCOUNTER — Ambulatory Visit (INDEPENDENT_AMBULATORY_CARE_PROVIDER_SITE_OTHER): Payer: Medicare Other

## 2017-03-26 VITALS — BP 138/68 | HR 81 | Temp 98.2°F | Resp 14 | Ht 67.5 in | Wt 231.1 lb

## 2017-03-26 DIAGNOSIS — Z0000 Encounter for general adult medical examination without abnormal findings: Secondary | ICD-10-CM | POA: Diagnosis not present

## 2017-03-26 MED ORDER — GLUCOSE BLOOD VI STRP: ORAL_STRIP | 1 refills | Status: DC

## 2017-03-26 NOTE — Telephone Encounter (Signed)
Patient states he is out of test strips because he checks his BS TID.  He requests 300 test strips for a 3 month supply.

## 2017-03-26 NOTE — Telephone Encounter (Signed)
Sent to pharmacy 

## 2017-03-26 NOTE — Addendum Note (Signed)
Addended by: Inetta Fermo on: 03/26/2017 03:59 PM   Modules accepted: Orders

## 2017-03-26 NOTE — Patient Instructions (Addendum)
  Mr. Patrick Patel , Thank you for taking time to come for your Medicare Wellness Visit. I appreciate your ongoing commitment to your health goals. Please review the following plan we discussed and let me know if I can assist you in the future.   Follow up with Dr. Birdie Sons as needed.    Check with [facility] and/or local pharmacy for most recent Pneumovax vaccine.  Have a great day!  These are the goals we discussed: Goals    . Healthy Lifestyle          Stay hydrated and drink plenty of water/fluids Stay active and continue walking for exercise Low carb foods       This is a list of the screening recommended for you and due dates:  Health Maintenance  Topic Date Due  . Eye exam for diabetics  08/19/1952  . Urine Protein Check  08/19/1952  . Tetanus Vaccine  08/19/1961  . Colon Cancer Screening  08/19/1992  . Pneumonia vaccines (1 of 2 - PCV13) 08/20/2007  . Flu Shot  07/11/2017  . Complete foot exam   08/18/2017  . Hemoglobin A1C  08/19/2017

## 2017-03-26 NOTE — Progress Notes (Signed)
Subjective:   Patrick Patel is a 76 y.o. male who presents for a Subsequent Medicare Annual Wellness Visit.  Review of Systems  No ROS.  Medicare Wellness Visit.  Patient Concerns: None at this time. Follow up with PCP as needed. Cardiac Risk Factors include: advanced age (>28mn, >>37women);male gender;obesity (BMI >30kg/m2);hypertension;diabetes mellitus    Objective:    Today's Vitals   03/26/17 1516  BP: 138/68  Pulse: 81  Resp: 14  Temp: 98.2 F (36.8 C)  TempSrc: Oral  SpO2: 97%  Weight: 231 lb 1.9 oz (104.8 kg)  Height: 5' 7.5" (1.715 m)   Body mass index is 35.66 kg/m.  Current Medications (verified) Outpatient Encounter Prescriptions as of 03/26/2017  Medication Sig  . acetaminophen (TYLENOL) 500 MG tablet Take 500 mg by mouth 2 (two) times daily at 10 AM and 5 PM.  . aspirin EC 325 MG tablet Take by mouth.  .Marland Kitchenatorvastatin (LIPITOR) 10 MG tablet Take 1 tablet (10 mg total) by mouth daily at 6 PM.  . blood glucose meter kit and supplies KIT Dispense Accu-Chek Aviva plus. Check once daily as directed. For ICD 10 code E11.9.  .Marland Kitchenchlorthalidone (HYGROTON) 50 MG tablet Take 1 tablet (50 mg total) by mouth daily.  . diphenhydrAMINE (BENADRYL) 12.5 MG/5ML elixir Take by mouth.  .Marland Kitchengemfibrozil (LOPID) 600 MG tablet Take 1 tablet (600 mg total) by mouth 2 (two) times daily before a meal.  . glipiZIDE (GLUCOTROL) 10 MG tablet Take 2 tablets (20 mg total) by mouth 2 (two) times daily.  . Magnesium 500 MG CAPS Take by mouth.  . metFORMIN (GLUCOPHAGE) 850 MG tablet Take 1 tablet by mouth 3  times daily with meals  . triamcinolone cream (KENALOG) 0.1 % Apply 1 application topically 2 (two) times daily as needed.  . [DISCONTINUED] acetaminophen (TYLENOL) 325 MG tablet Take 2 tablets (650 mg total) by mouth every 6 (six) hours as needed.  . [DISCONTINUED] ciprofloxacin (CIPRO) 250 MG tablet Take 1 tablet (250 mg total) by mouth 2 (two) times daily.   No facility-administered  encounter medications on file as of 03/26/2017.     Allergies (verified) Bactrim [sulfamethoxazole-trimethoprim]; Pioglitazone; and Rosiglitazone   History: Past Medical History:  Diagnosis Date  . Diabetes ([place]   . Hyperlipidemia   . Hypertension    History reviewed. No pertinent surgical history. History reviewed. No pertinent family history. Social History   Occupational History  . Not on file.   Social History Main Topics  . Smoking status: Former SResearch scientist (life sciences) . Smokeless tobacco: Current User    Types: Snuff  . Alcohol use No  . Drug use: No  . Sexual activity: Not on file   Tobacco Counseling Ready to quit: Not Answered Counseling given: Not Answered   Activities of Daily Living In your present state of health, do you have any difficulty performing the following activities: 03/26/2017  Hearing? N  Vision? N  Difficulty concentrating or making decisions? N  Walking or climbing stairs? Y  Dressing or bathing? N  Doing errands, shopping? N  Preparing Food and eating ? N  Using the Toilet? N  In the past six months, have you accidently leaked urine? N  Do you have problems with loss of bowel control? N  Managing your Medications? N  Managing your Finances? Y  Housekeeping or managing your Housekeeping? N  Some recent data might be hidden    Immunizations and Health Maintenance Immunization History  Administered Date(s) Administered  .  Influenza, High Dose Seasonal PF 08/18/2016   Health Maintenance Due  Topic Date Due  . OPHTHALMOLOGY EXAM  08/19/1952  . URINE MICROALBUMIN  08/19/1952  . TETANUS/TDAP  08/19/1961  . COLONOSCOPY  08/19/1992  . PNA vac Low Risk Adult (1 of 2 - PCV13) 08/20/2007    Patient Care Team: Patrick Haven, MD as PCP - General (Family Medicine) Patrick Patel, [facility] as [place] [facility] (Pharmacist)  Indicate any recent Medical Services you may have received from other than [facility] providers in the past year  (date may be approximate).    Assessment:   This is a routine wellness examination for Patrick Patel. The goal of the wellness visit is to assist the patient how to close the gaps in care and create a preventative care plan for the patient.   Osteoporosis risk reviewed.  Medications reviewed; taking without issues or barriers.  Safety issues reviewed; smoke detectors in the home. Firearms locked up in the home. Wears seatbelts when driving or riding with others. Patient does wear sunscreen or protective clothing when in direct sunlight. No violence in the home.  Patient is alert, normal appearance, oriented to person/place/and time. Correctly identified the president of the [place], recall of 3/3 words, and performing simple calculations.  Patient displays appropriate judgement and can read correct time from watch face.  No new identified risk were noted.  No failures at ADL's or IADL's.   BMI- discussed the importance of a healthy diet, water intake and exercise. Educational material provided.   24 hour diet recall:   Breakfast: Sausage/spam, 3 egg whites with cheese  Lunch: Soup, ham/chicken Dinner: Meat, vegetable Daily fluid intake: 3 cups of caffeine, 3 cups of non caffeine beverages  HTN- followed by PCP.  Sleep patterns- Sleeps 6.5-8 hours at night.  Wakes feeling rested.   TDAP vaccine deferred per patient preference.  Follow up with insurance.  Educational material provided.  Prevnar 13/Pneumovax 23 vaccine deferred per patient request.  He is unsure of the last administration and will follow up with his previous physician/pharmacy to verify.    Colonoscopy/Cologuard deferred per patient request, due to recent Hemoccult ICT test.  Patient Concerns: None at this time. Follow up with PCP as needed.  Hearing/Vision screen Hearing Screening Comments: Patient is able to hear conversational tones without difficulty.  No issues reported.   Vision Screening Comments: Followed  by [facility] Wears corrective lenses Blindness test completed 2017 Visual acuity not assessed per patient preference  Dietary issues and exercise activities discussed: Current Exercise Habits: Home exercise routine, Type of exercise: walking, Time (Minutes): 20, Frequency (Times/Week): 4, Weekly Exercise (Minutes/Week): 80, Intensity: Mild  Goals    . Healthy Lifestyle          Stay hydrated and drink plenty of water/fluids Stay active and continue walking for exercise Low carb foods      Depression Screen PHQ 2/9 Scores 03/26/2017 02/16/2017  PHQ - 2 Score 0 0    Fall Risk Fall Risk  03/26/2017 02/16/2017  Falls in the past year? No No    Cognitive Function: MMSE - Mini Mental State Exam 03/26/2017  Orientation to time 5  Orientation to Place 5  Registration 3  Attention/ Calculation 5  Recall 3  Language- name 2 objects 2  Language- repeat 1  Language- follow 3 step command 3  Language- read & follow direction 1  Write a sentence 1  Copy design 1  Total score 30  Screening Tests Health Maintenance  Topic Date Due  . OPHTHALMOLOGY EXAM  08/19/1952  . URINE MICROALBUMIN  08/19/1952  . TETANUS/TDAP  08/19/1961  . COLONOSCOPY  08/19/1992  . PNA vac Low Risk Adult (1 of 2 - PCV13) 08/20/2007  . INFLUENZA VACCINE  07/11/2017  . FOOT EXAM  08/18/2017  . HEMOGLOBIN A1C  08/19/2017        Plan:   End of life planning; Advanced aging; Advanced directives discussed.  No HCPOA/Living Will.  Additional information declined at this time.  Medicare Attestation I have personally reviewed: The patient's medical and social history Their use of alcohol, tobacco or illicit drugs Their current medications and supplements The patient's functional ability including ADLs,fall risks, home safety risks, cognitive, and hearing and visual impairment Diet and physical activities Evidence for depression   The patient's weight, height, BMI, and  visual acuity have been recorded in the chart.  I have made referrals and provided education to the patient based on review of the above and I have provided the patient with a written personalized care plan for preventive services.    During the course of the visit Morrill was educated and counseled about the following appropriate screening and preventive services:   Vaccines to include Pneumoccal, Influenza, Hepatitis B, Td, Zostavax, HCV  Diabetes nutrition  Nutrition counseling  Smoking cessation counseling  Patient Instructions (the written plan) were given to the patient.   Varney Biles, LPN   2/70/3500

## 2017-03-29 NOTE — Progress Notes (Signed)
I have reviewed the above note and agree.  Alexius Hangartner, M.D.  

## 2017-04-06 ENCOUNTER — Ambulatory Visit: Payer: Self-pay | Admitting: Pharmacist

## 2017-04-10 ENCOUNTER — Ambulatory Visit: Payer: Self-pay | Admitting: Pharmacist

## 2017-04-10 ENCOUNTER — Other Ambulatory Visit: Payer: Self-pay | Admitting: Pharmacist

## 2017-04-10 NOTE — Patient Outreach (Signed)
[facility]) Care Management  04/10/2017  Patrick Patel Jul 28, 1941 [id number]  76 y.o. year old male referred to [facility] pharmacy for Medication Assistance (Pharmacy Telephone Outreach) Called patient via telephone today and he confirms he has received denial letter for low income subsidy/extra help.  During his annual wellness visit on 03/26/17 patient signed Jardiance patient assistance form and provided documentation for his income.  Talked with Dr Birdie Sons and he has okayed patient starting Jardiance.    Plan: [facility] pharmacy sent prepaid envelope to patient so he can send denial letter for low income subsidy to complete [employer] application for [employer] -Will obtain paper prescription from Dr Birdie Sons for Marrion Coy, PharmD, BCPS [facility] PGY2 Pharmacy Resident [phone]

## 2017-04-11 ENCOUNTER — Other Ambulatory Visit: Payer: Self-pay | Admitting: Family Medicine

## 2017-04-11 MED ORDER — EMPAGLIFLOZIN 10 MG PO TABS: 10.0000 mg | ORAL_TABLET | Freq: Every day | ORAL | 3 refills | Status: DC

## 2017-04-24 ENCOUNTER — Other Ambulatory Visit: Payer: Self-pay | Admitting: Pharmacist

## 2017-04-24 NOTE — Patient Outreach (Signed)
[facility]) Care Management  04/24/2017  Patrick Patel Aug 01, 1941 [id number]   76 year old male referred to [place] for medication assistance.  Have received low income subsidy application from patient via mail.  Called patient to notify him of receipt and submission of application but was unable to reach him via telephone.  Have left HIPAA compliant voicemail asking him to return myc all.    Assessment: Medication Assistance: [name] patient that is potential [place] patient assistance. Patient has been denied [name] and has not met out of pocket spend required for many diabetes manufacturers ($1000 or 5% total income) for medications. Patient unable to afford copay for Tier 3 medications or [employer] over the counter insulin. Patient is currently on max dose/max tolerated dose of sulfonylurea and metformin. He has previous edema with TZD (Actos and Avandia).   Plan: Received Extra Help/Low income subsidy denial letter from patient Patrick Patel patient assistance application [employer] [place] Will followup with patient in 1-2 weeks via telephone to determine status of manufacturer patient assistance.   Patrick Patel, PharmD, [place] PGY2 Pharmacy Resident [phone]

## 2017-05-02 ENCOUNTER — Other Ambulatory Visit: Payer: Self-pay | Admitting: Pharmacist

## 2017-05-08 ENCOUNTER — Other Ambulatory Visit: Payer: Self-pay | Admitting: Pharmacist

## 2017-05-08 NOTE — Patient Outreach (Signed)
[facility]) Care Management  05/02/2017  Arlyce Dicerchie D Boese 04/20/1941 [id number]  76 y.o. year old male [facility]ication [facility] (Pharmacy Telephone Outreach).  Called [employer] and they stated application is still processing for [employer]ard[employer].  Called patient to notify of current status.  Plan: Will followup via telephone with patient and [employer] next week  Hazle NordmannKelsy Dashae Wilcher, PharmD, BCPS [facility] PGY2 Pharmacy Resident [phone]

## 2017-05-08 NOTE — Patient Outreach (Addendum)
[facility]) Care Management  05/08/2017  Patrick Patel 12-16-1940 [id number]   76 y.o. year old male referred to [place] for Medication Assistance (Pharmacy Telephone Followup)  [place] today to followup patient assistance application for [employer].  Patient has been temporarily approved until 07/02/2017.  Patient must send [place] Medicaid denial letter to extend his eligibility.  Representative also states application needs a prescription (was sent in with original application).    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 Patient has been denied [place] and has not met out of pocket spend required for many diabetes manufacturers ($1000 or 5% total income) for medications except for Jardiance with [place]. Patient unable to afford copay for Tier 3 medications or [employer] over the counter insulin. Patient is currently on max dose/max tolerated dose of sulfonylurea and metformin. He has previous edema with TZD (Actos and Avandia). Patient has been temporarily approved for patient assistance with Jardiance until 07/02/2017.  Extension of application is awaiting denial letter for [facility] Medicaid.    Plan: -Resent Jardiance prescription to [employer] -Notified patient that he will need  to apply to [facility] and provide denial letter.  Patient demonstrated understanding.  Will send patient letter with return envelope to send [place] Medicaid denial letter.  -Will notify Dr Caryl Bis of application status. -Will followup with patient in 2-3 weeks via telephone   Bennye Alm, PharmD, [place] PGY2 Pharmacy Resident [phone]

## 2017-05-09 ENCOUNTER — Ambulatory Visit: Payer: Self-pay | Admitting: Pharmacist

## 2017-05-29 ENCOUNTER — Other Ambulatory Visit: Payer: Self-pay | Admitting: Pharmacist

## 2017-05-29 NOTE — Patient Outreach (Signed)
[facility]) Care Management  05/29/2017  Patrick Patel D [place] 01/08/1941 [id number]   76 y.o. year old male [facility] (Pharmacy Telephone Followup)   Was unable to reach patient via telephone today and have left HIPAA compliant voicemail asking patient to return my call (unsuccessful outreach #1).  Patient has been temporarily approved until 07/02/2017.  Patient must send [place] Medicaid denial letter to extend his eligibility.  Patient left voicemail stating he was hesitant to contact [place] Medicaid for denial letter as he is scared of losing his foodstamps due to the land he has inherited.    Plan: Will contact [facility] LCSW for guidance about the [place] Medicaid and Foodstamps patient is receiving and the risk of him losing his foodstamps. Will followup in 1-3 days via telephone  Hazle NordmannKelsy Combs, PharmD, BCPS [facility] PGY2 Pharmacy Resident [phone]

## 2017-05-30 ENCOUNTER — Ambulatory Visit (INDEPENDENT_AMBULATORY_CARE_PROVIDER_SITE_OTHER): Payer: Medicare Other | Admitting: Family Medicine

## 2017-05-30 ENCOUNTER — Encounter: Payer: Self-pay | Admitting: Family Medicine

## 2017-05-30 VITALS — BP 140/64 | HR 96 | Temp 98.1°F | Wt 236.8 lb

## 2017-05-30 DIAGNOSIS — E119 Type 2 diabetes mellitus without complications: Secondary | ICD-10-CM

## 2017-05-30 DIAGNOSIS — I25118 Atherosclerotic heart disease of native coronary artery with other forms of angina pectoris: Secondary | ICD-10-CM | POA: Diagnosis not present

## 2017-05-30 DIAGNOSIS — R300 Dysuria: Secondary | ICD-10-CM | POA: Diagnosis not present

## 2017-05-30 LAB — POCT URINALYSIS DIPSTICK
BILIRUBIN UA: NEGATIVE
Glucose, UA: NEGATIVE
Ketones, UA: NEGATIVE
NITRITE UA: POSITIVE
PH UA: 6 (ref 5.0–8.0)
Protein, UA: POSITIVE
RBC UA: POSITIVE
Spec Grav, UA: >=1.03 — AB (ref 1.010–1.025)
Urobilinogen, UA: 0.2 E.U./dL

## 2017-05-30 LAB — POCT GLYCOSYLATED HEMOGLOBIN (HGB A1C): Hemoglobin A1C: 7.3

## 2017-05-30 MED ORDER — CIPROFLOXACIN HCL 500 MG PO TABS: 500.0000 mg | ORAL_TABLET | Freq: Two times a day (BID) | ORAL | 0 refills | Status: DC

## 2017-05-30 NOTE — Patient Instructions (Addendum)
Nice to see you. We'll start you on ciprofloxacin for your likely urine infection versus prostate infection. He need to be careful while on this medication as it may cause her sugars to drop in combination with your diabetes medication. If he started to develop low blood sugars or sweatiness, jitteriness, shakiness, or any other symptoms he should check her sugar and eat something. We will have a return for fasting lab work. If you develop chest pain that is persistent or shortness of breath that is persistent or you develop fevers or abdominal pain or any new or changing symptoms please seek medical attention immediately.

## 2017-05-30 NOTE — Progress Notes (Signed)
[name], MD Phone: [phone]  Estle D Yim is a 76 y.o. male who presents today for follow-up.  Dysuria: Patient notes over the last 2 days he's had dysuria. He notes about a month ago he had similar symptoms and took 7 doses of Keflex. Symptoms resolved but recurred recently. Notes frequency and urgency. No fevers. No abdominal pain. No hematuria. No penile discharge. Has chronic intermittent diarrhea and constipation though this is unchanged from prior.  Diabetes: Patient notes that his sugars are typically running 35-40 points lower. He has not started on the Jardiance as he has not received it. He's currently taking glipizide and metformin. He notes rare polydipsia. No hypoglycemia. He recently started on over-the-counter insulin Novolin N 18 units in the morning and 12 units in the evening. He was previously on these doses.  CAD: Noted to have CAD on prior CT scan. Was evaluated by cardiology with echo and was supposed nuclear stress test though he declines to do this. Notes his chest pain has become less frequent and less severe. Describes it as shortness of breath and then has shoulder discomfort. Notes it always occurs after eating and then within a number of minutes he is able to do anything physically active once it resolves. Not typically brought on by physical activity. Does take Lipitor. He's discontinued gemfibrozil.  PMH: Former smoker   ROS see history of present illness  Objective  Physical Exam Vitals:   05/30/17 1517 05/30/17 1612  BP: 140/70 140/64  Pulse: 96   Temp: 98.1 F (36.7 C)     BP Readings from Last 3 Encounters:  05/30/17 140/64  03/26/17 138/68  02/26/17 (!) 144/70   Wt Readings from Last 3 Encounters:  05/30/17 236 lb 12.8 oz (107.4 kg)  03/26/17 231 lb 1.9 oz (104.8 kg)  02/16/17 232 lb (105.2 kg)    Physical Exam  Constitutional: No distress.  Cardiovascular: Normal rate, regular rhythm and normal heart sounds.     Pulmonary/Chest: Effort normal and breath sounds normal.  Genitourinary:  Genitourinary Comments: Normal rectum, prostate is somewhat tender, there is no bogginess to it, there are no nodules, it's minimally enlarged  Neurological: He is alert. Gait normal.  Skin: Skin is warm and dry. He is not diaphoretic.     Assessment/Plan: Please see individual problem list.  Dysuria Patient has had dysuria over the last 2 days. Urine is concerning for infection. Prostate exam with some tenderness bringing into question possible prostatitis. We'll start him on Cipro 500 mg as his kidney function can tolerate this. Discussed potential for hypoglycemia with this in combination with his glipizide. Advised of hypoglycemia precautions. We'll send his urine for culture and microscopy. We will need to consider a possible extended course of ciprofloxacin depending on his urine culture results. We may need to also consider referral to urology.  CAD (coronary artery disease) Discussed diagnosis with patient. Discussed prevention strategies. He is on Lipitor. He will stay off of the gemfibrozil. We'll recheck a cholesterol panel. We are treating his diabetes. Continue to treat his blood pressure. His diastolic blood pressures are low normal on recheck and thus we'll continue his medications as we have been given his age and this. He does not want any further workup for this. He'll monitor. Given return precautions.  Diabetes ([place]) A1c slightly improved at 7.3. He has started himself on over-the-counter insulin on his own. No hypoglycemia. We'll send a message to our pharmacist regarding his Vania Rea as he has not yet received  it. I advised patient not to start on this until he has heard back from [place] if he receives it before he hears back from [place].   Orders Placed This Encounter  Procedures  . Urine Culture  . Urine Microscopic Only  . Comp Met (CMET)  . Lipid Profile  . POCT urinalysis dipstick  . POCT HgB  A1C    Meds ordered this encounter  Medications  . ciprofloxacin (CIPRO) 500 MG tablet    Sig: Take 1 tablet (500 mg total) by mouth 2 (two) times daily.    Dispense:  14 tablet    Refill:  0    [name], MD [place]

## 2017-05-30 NOTE — Assessment & Plan Note (Addendum)
Patient has had dysuria over the last 2 days. Urine is concerning for infection. Prostate exam with some tenderness bringing into question possible prostatitis. We'll start him on Cipro 500 mg as his kidney function can tolerate this. Discussed potential for hypoglycemia with this in combination with his glipizide. Advised of hypoglycemia precautions. We'll send his urine for culture and microscopy. We will need to consider a possible extended course of ciprofloxacin depending on his urine culture results. We may need to also consider referral to urology.

## 2017-05-30 NOTE — Addendum Note (Signed)
Addended by: Warden FillersWRIGHT, Hue Frick S on: 05/30/2017 04:44 PM   Modules accepted: Orders

## 2017-05-30 NOTE — Assessment & Plan Note (Signed)
A1c slightly improved at 7.3. He has started himself on over-the-counter insulin on his own. No hypoglycemia. We'll send a message to our pharmacist regarding his [place] PepperJardiance as he has not yet received it. I advised patient not to start on this until he has heard back from us if he receives it before he hears back from us.

## 2017-05-30 NOTE — Assessment & Plan Note (Addendum)
Discussed diagnosis with patient. Discussed prevention strategies. He is on Lipitor. He will stay off of the gemfibrozil. We'll recheck a cholesterol panel. We are treating his diabetes. Continue to treat his blood pressure. His diastolic blood pressures are low normal on recheck and thus we'll continue his medications as we have been given his age and this. He does not want any further workup for this. He'll monitor. Given return precautions.

## 2017-05-31 ENCOUNTER — Other Ambulatory Visit (INDEPENDENT_AMBULATORY_CARE_PROVIDER_SITE_OTHER): Payer: Medicare Other

## 2017-05-31 DIAGNOSIS — I25118 Atherosclerotic heart disease of native coronary artery with other forms of angina pectoris: Secondary | ICD-10-CM | POA: Diagnosis not present

## 2017-05-31 DIAGNOSIS — E119 Type 2 diabetes mellitus without complications: Secondary | ICD-10-CM | POA: Diagnosis not present

## 2017-05-31 LAB — URINALYSIS, MICROSCOPIC ONLY

## 2017-05-31 NOTE — Addendum Note (Signed)
Addended by: Penne LashWIGGINS, Tijana Walder N on: 05/31/2017 03:27 PM   Modules accepted: Orders

## 2017-05-31 NOTE — Addendum Note (Signed)
Addended by: Penne LashWIGGINS, Annice Jolly N on: 05/31/2017 03:25 PM   Modules accepted: Orders

## 2017-06-01 ENCOUNTER — Other Ambulatory Visit: Payer: Self-pay | Admitting: Pharmacist

## 2017-06-01 ENCOUNTER — Ambulatory Visit: Payer: Self-pay | Admitting: Pharmacist

## 2017-06-01 LAB — LIPID PANEL
CHOL/HDL RATIO: 4
Cholesterol: 141 mg/dL (ref 0–200)
HDL: 34.3 mg/dL — AB (ref >39.00)
LDL CALC: 67 mg/dL (ref 0–99)
NONHDL: 106.82
Triglycerides: 200 mg/dL — ABNORMAL HIGH (ref 0.0–149.0)
VLDL: 40 mg/dL (ref 0.0–40.0)

## 2017-06-01 LAB — COMPREHENSIVE METABOLIC PANEL
ALT: 16 U/L (ref 0–53)
AST: 22 U/L (ref 0–37)
Albumin: 4.2 g/dL (ref 3.5–5.2)
Alkaline Phosphatase: 58 U/L (ref 39–117)
BILIRUBIN TOTAL: 0.4 mg/dL (ref 0.2–1.2)
BUN: 22 mg/dL (ref 6–23)
CHLORIDE: 101 meq/L (ref 96–112)
CO2: 29 meq/L (ref 19–32)
Calcium: 10 mg/dL (ref 8.4–10.5)
Creatinine, Ser: 0.93 mg/dL (ref 0.40–1.50)
GFR: 84.24 mL/min (ref >60.00)
Glucose, Bld: 176 mg/dL — ABNORMAL HIGH (ref 70–99)
Potassium: 4.5 mEq/L (ref 3.5–5.1)
Sodium: 141 mEq/L (ref 135–145)
Total Protein: 7.1 g/dL (ref 6.0–8.3)

## 2017-06-01 NOTE — Patient Outreach (Signed)
[facility]) Care Management  06/01/2017  Patrick Patel 01/27/1941 [id number]   76 y.o. year old male [facility] Medication [facility] (Pharmacy Telephone Followup)   Was unable to reach patient via telephone today and have left HIPAA compliant voicemail asking patient to return my call (unsuccessful outreach #1).  Patient called yesterday and left message stating he received a letter stating he was enrolled in Medicaid effective July 1st.  Patient also stated [employer] is going to overnight ship his [place] PepperJardiance today.  Patient had picked up over the counter Novolin N (Dr Birdie SonsSonnenberg aware) and he plans on continuing this and holding the Jardiance until Dr Birdie SonsSonnenberg tells him to start.    Plan: Will followup within 7 days via telephone  Hazle NordmannKelsy Fate Galanti, PharmD, BCPS [facility] PGY2 Pharmacy Resident [phone]

## 2017-06-02 LAB — URINE CULTURE

## 2017-06-05 ENCOUNTER — Telehealth: Payer: Self-pay | Admitting: Family Medicine

## 2017-06-05 NOTE — Telephone Encounter (Signed)
Pt called back returning your call. Pt asked to call him after 4, he needs to run out quickly. Please advise, thank you!  Call pt @ [phone]

## 2017-06-05 NOTE — Telephone Encounter (Signed)
See result note.  

## 2017-06-06 ENCOUNTER — Telehealth: Payer: Self-pay | Admitting: *Deleted

## 2017-06-06 ENCOUNTER — Other Ambulatory Visit: Payer: Self-pay | Admitting: Pharmacist

## 2017-06-06 NOTE — Telephone Encounter (Signed)
See other message

## 2017-06-06 NOTE — Telephone Encounter (Signed)
Patient requested lab Pt Contact: [phone]

## 2017-06-06 NOTE — Patient Outreach (Addendum)
[facility]) Care Management  06/06/2017  Patrick Patel 04/03/1941 [id number]   76 y.o. year old male [facility] Medication Adherence (Pharmacy Telephone Followup)  Called patient today to followup medication adherence.  Patient has received his Jardiance and started taking it yesterday.  Reports CBG as low as 117 last night before bed.  He was a little scared and he did not take his Novolin N last night.     Reports Fasting CBG today 164 mg/dL.   Denies hypoglycemic episodes(CBG <70).  Reports proper treatment knowledge.   Patient concerned about chlorthalidone.  He read on the internet about possible dehydration with Jardiance.   States July 1st to determine which type of which type of medicaid he has.  Patient plans to call next week and find out.     Plan: Discussed Jardiance dosing, efficacy and potential side effects including urinary tract infections/yeast infections.  Discussed drinking plenty of water on Jardiance. Cautioned against stopping chlorthalidone and instructed him Dr Birdie SonsSonnenberg would be checking his kidney function periodically Instructed patient to check CBGs and write the values down.  Recommended per Dr Birdie SonsSonnenberg that he may decrease his insulin a few units until he sees the effect of Jardiance.  Patient stated he would keep an eye and may stop the insulin if he finds the Jardiance able to control his blood glucose.  Patient currently taking Aspirin 325 mg 1 and 1/2 tablets daily. Will recommend decreasing to 325 mg daily per chart review recommendation from Dr Birdie SonsSonnenberg at next visit. Patient also not currently on an ACE inhibitor.  Will recommend with Dr Birdie SonsSonnenberg.   Will followup in 7 days via telephone  Hazle NordmannKelsy Sabatino Williard, PharmD, BCPS [facility] PGY2 Pharmacy Resident [phone]

## 2017-06-14 ENCOUNTER — Other Ambulatory Visit: Payer: Self-pay | Admitting: Pharmacist

## 2017-06-14 NOTE — Patient Outreach (Addendum)
[facility]) Care Management  06/14/2017  Patrick Patel 04/13/1941 [id number]   76 y.o. year old male [facility] ([facility]armacy Visit)   Received call from patient today stating he called Medicaid and that they told him his medicaid will not cover medications but only his medicare part A or B premium.    Patient states since starting Jardiance he has decreased his Insulin NPH to 12 units with breakfast and also decreased his glipizide to 10 mg three times daily with meals.  He denies hypoglycemia.    Patient reported CBGs: Fasting: 149, 181, 200, 154, 181, 159, 177, 171, 168 Before Dinner: 125, 113, 121, 106, 132, 128, 119, 126, 119 At Bedtime: 196, 95, 151, 140, 143, 148, 141, 152   Plan: -Called [employer] and they state to send Medicaid paperwork for their review. -Instructed patient to mail copies of his medicaid paperwork to be forwarded to the patient assistance foundation.  -Will notify Dr Birdie SonsSonnenberg of patients current CBGs and Medicaid status -Followup in 1-2 weeks via telephone  Hazle NordmannKelsy Jamiee Milholland, PharmD, BCPS [facility] PGY2 Pharmacy Resident [phone]

## 2017-06-15 ENCOUNTER — Ambulatory Visit: Payer: Self-pay | Admitting: Pharmacist

## 2017-06-19 ENCOUNTER — Other Ambulatory Visit: Payer: Self-pay | Admitting: Pharmacist

## 2017-06-19 NOTE — Patient Outreach (Signed)
[facility]) Care Management  06/19/2017  Patrick Patel 12/31/1940 [id number]  76 y.o. year old male [facility] Medication [facility] (Pharmacy Visit)  Received [place] Medicaid approval letter and card via mail from patient.  Received call from patient last week stating he called Medicaid and that they told him his medicaid will not cover medications but only his medicare part A or B premium.   Faxed [place] Medicaid approval letter and card to [employer] stating "Attached are patient approval letters for [facility] for assistance with Medicare Part A and B premiums.  Per [place] Medicaid patient will NOT receive any assistance with his medications."  Called [employer] and spoke with them regarding case.  They instructed to fax to expedited fax number at [phone].  Patients case number is [id number].   Plan: Faxed [place] medicaid approval letter and card to [employer] Will followup via telephone in 1-2 weeks  Hazle NordmannKelsy Combs, PharmD, BCPS [facility] PGY2 Pharmacy Resident [phone]

## 2017-06-26 ENCOUNTER — Other Ambulatory Visit: Payer: Self-pay | Admitting: Pharmacist

## 2017-06-26 NOTE — Patient Outreach (Signed)
[facility]) Care Management  06/26/2017  Patrick Patel 12/26/1940 [id number]    76 y.o. year old male [facility] for Medication [facility] (Pharmacy Case Closure)  Patient states he was approved for Extra Help.  He states his copay for brand name medications is for $8.35.   Patient denies hypoglycemia.  Patient states he was waking up feeling very thirsty a few nights ago.  He states he didn't take the Jardiance yesterday and the thirst went away.   Plan: -Instructed patient to call Dr Birdie SonsSonnenberg if he has the thirst issues again.   -Will notify Dr Birdie SonsSonnenberg patient has been approved for Extra Help/Low Income Subsidy.  Any of patients medications will now be $8.35 for Brand Name Medications.  [facility] pharmacy will sign off as medication assistance has been arranged. Please reconsult [facility] pharmacy if needed.   Patrick NordmannKelsy Patel, PharmD, BCPS [facility] PGY2 Pharmacy Resident [phone]

## 2017-06-27 ENCOUNTER — Other Ambulatory Visit: Payer: Self-pay

## 2017-06-27 MED ORDER — ACCU-CHEK SOFTCLIX LANCETS MISC: 12 refills | Status: DC

## 2017-06-27 MED ORDER — GLUCOSE BLOOD VI STRP: ORAL_STRIP | 3 refills | Status: DC

## 2017-06-27 MED ORDER — INSULIN NPH (HUMAN) (ISOPHANE) 100 UNIT/ML ~~LOC~~ SUSP: 12.0000 [IU] | Freq: Every day | SUBCUTANEOUS | 4 refills | Status: DC

## 2017-06-28 ENCOUNTER — Telehealth: Payer: Self-pay | Admitting: Family Medicine

## 2017-06-28 MED ORDER — INSULIN NPH (HUMAN) (ISOPHANE) 100 UNIT/ML ~~LOC~~ SUSP: 12.0000 [IU] | Freq: Every day | SUBCUTANEOUS | 0 refills | Status: DC

## 2017-06-28 NOTE — Telephone Encounter (Signed)
Sent in 90 day supply, patient notified

## 2017-06-28 NOTE — Telephone Encounter (Signed)
Pt called requesting a refill on insulin NPH Human (HUMULIN N,NOVOLIN N) 100 UNIT/ML injection. Pt would like to know if we could call in 90 day supply for him as it is the same coast at 30. Pt would like a call back today. Please advise, thank you!  [facility] [address] - [place], [place] - [id number]

## 2017-06-28 NOTE — Addendum Note (Signed)
Addended by: Inetta FermoHENDRICKS, JESSICA S on: 06/28/2017 03:47 PM   Modules accepted: Orders

## 2017-06-28 NOTE — Telephone Encounter (Signed)
Sent to pharmacy 

## 2017-07-03 DIAGNOSIS — N390 Urinary tract infection, site not specified: Secondary | ICD-10-CM | POA: Diagnosis not present

## 2017-07-05 ENCOUNTER — Other Ambulatory Visit: Payer: Self-pay | Admitting: Family Medicine

## 2017-07-05 MED ORDER — GLUCOSE BLOOD VI STRP: ORAL_STRIP | 3 refills | Status: DC

## 2017-07-05 MED ORDER — ACCU-CHEK SOFTCLIX LANCETS MISC: 3 refills | Status: DC

## 2017-07-25 ENCOUNTER — Encounter: Payer: Self-pay | Admitting: Family Medicine

## 2017-07-25 ENCOUNTER — Ambulatory Visit (INDEPENDENT_AMBULATORY_CARE_PROVIDER_SITE_OTHER): Payer: Medicare Other | Admitting: Family Medicine

## 2017-07-25 VITALS — BP 140/68 | HR 92 | Temp 98.2°F | Wt 237.2 lb

## 2017-07-25 DIAGNOSIS — R300 Dysuria: Secondary | ICD-10-CM | POA: Diagnosis not present

## 2017-07-25 DIAGNOSIS — I878 Other specified disorders of veins: Secondary | ICD-10-CM | POA: Diagnosis not present

## 2017-07-25 DIAGNOSIS — E119 Type 2 diabetes mellitus without complications: Secondary | ICD-10-CM | POA: Diagnosis not present

## 2017-07-25 DIAGNOSIS — N478 Other disorders of prepuce: Secondary | ICD-10-CM | POA: Diagnosis not present

## 2017-07-25 LAB — POCT URINALYSIS DIPSTICK
BILIRUBIN UA: NEGATIVE
Blood, UA: NEGATIVE
Glucose, UA: 500
KETONES UA: NEGATIVE
LEUKOCYTES UA: NEGATIVE
Nitrite, UA: NEGATIVE
PH UA: 5 (ref 5.0–8.0)
Protein, UA: NEGATIVE
SPEC GRAV UA: 1.015 (ref 1.010–1.025)
Urobilinogen, UA: 0.2 E.U./dL

## 2017-07-25 MED ORDER — EMPAGLIFLOZIN 10 MG PO TABS
10.0000 mg | ORAL_TABLET | Freq: Every day | ORAL | 3 refills | Status: DC
Start: 2017-07-25 — End: 2017-09-03

## 2017-07-25 MED ORDER — ACCU-CHEK SOFTCLIX LANCETS MISC: 3 refills | Status: DC

## 2017-07-25 MED ORDER — INSULIN PEN NEEDLE 31G X 5 MM MISC: 3 refills | Status: DC

## 2017-07-25 MED ORDER — GLUCOSE BLOOD VI STRP: ORAL_STRIP | 3 refills | Status: DC

## 2017-07-25 NOTE — Patient Instructions (Signed)
Nice to see you. We'll send your urine for culture. We'll check lab work today and contact you with the results. I sent refills to your pharmacy.

## 2017-07-25 NOTE — Assessment & Plan Note (Signed)
Suspect swelling and erythema likely related to venous stasis. Suspect slight skin breakdown related to his venous stasis swelling. Advised on topical antibiotics over-the-counter and monitoring. Discussed return precautions for signs of infection.

## 2017-07-25 NOTE — Assessment & Plan Note (Signed)
UA is unremarkable for cause for dysuria. Penile exam unremarkable for cause is well. Could've been related to dehydration. We'll check a urine culture. If this is persistent we could consider referral to urology. Patient will monitor for now.

## 2017-07-25 NOTE — Assessment & Plan Note (Signed)
Improved. Only slight irritation today. Discussed continuing topical treatment.

## 2017-07-25 NOTE — Progress Notes (Signed)
Patrick AlarEric Aliciana Ricciardi, Patrick Patel Phone: [phone]  Patrick Patel is a 76 y.o. male who presents today for follow-up.  Dysuria: Patient notes over the last 6 days he's had some dysuria. Notes his urine was darker in color and then became lighter over the last day or so. Less dysuria. No frequency. No urgency. No discharge. He drinks plenty of fluid.  Diabetes: Notes better control recently with sugars mostly less than 150 though occasionally up to close to 200 after he eats. He is currently taking glipizide, NPH 12-15 units daily, Jardiance, and metformin. No polyuria. Rare polydipsia. No hyperglycemia.  Patient notes the issue of cracking of his foreskin has improved. He has been using the cream on it intermittently.  Patient does note occasional swelling in his ankles bilaterally. This has gone down since starting on the Jardiance. He has chronic venous insufficiency changes as well. He'll occasionally get a slight ulcer on the lateral aspect of his right lower leg. This will eventually heal. He notes no spreading redness, warmth, or tenderness in this area.  PMH: Former smoker   ROS see history of present illness  Objective  Physical Exam Vitals:   07/25/17 1553  BP: 140/68  Pulse: 92  Temp: 98.2 F (36.8 C)  SpO2: 96%    BP Readings from Last 3 Encounters:  07/25/17 140/68  05/30/17 140/64  03/26/17 138/68   Wt Readings from Last 3 Encounters:  07/25/17 237 lb 3.2 oz (107.6 kg)  05/30/17 236 lb 12.8 oz (107.4 kg)  03/26/17 231 lb 1.9 oz (104.8 kg)    Physical Exam  Constitutional: No distress.  Cardiovascular: Normal rate, regular rhythm and normal heart sounds.   Pulmonary/Chest: Effort normal and breath sounds normal.  Genitourinary:  Genitourinary Comments: Normal uncircumcised penis with no tenderness, slight irritation underneath the foreskin  Musculoskeletal: He exhibits no edema.       Feet:  Neurological: He is alert. Gait normal.  Skin: He is not diaphoretic.       Assessment/Plan: Please see individual problem list.  Foreskin problem Improved. Only slight irritation today. Discussed continuing topical treatment.  Diabetes (HCC) Improved control. We'll check a BMP today. Continue current medications.  Venous stasis of both lower extremities Suspect swelling and erythema likely related to venous stasis. Suspect slight skin breakdown related to his venous stasis swelling. Advised on topical antibiotics over-the-counter and monitoring. Discussed return precautions for signs of infection.  Dysuria UA is unremarkable for cause for dysuria. Penile exam unremarkable for cause is well. Could've been related to dehydration. We'll check a urine culture. If this is persistent we could consider referral to urology. Patient will monitor for now.   Orders Placed This Encounter  Procedures  . Urine Culture  . Basic Metabolic Panel (BMET)  . POCT Urinalysis Dipstick    Meds ordered this encounter  Medications  . ACCU-CHEK SOFTCLIX LANCETS lancets    Sig: Use to check blood glucose 3 times daily    Dispense:  300 each    Refill:  3  . glucose blood (ACCU-CHEK AVIVA) test strip    Sig: Use to check blood glucose 3 times daily    Dispense:  300 each    Refill:  3  . Insulin Pen Needle 31G X 5 MM MISC    Sig: Use once daily with insulin    Dispense:  200 each    Refill:  3  . empagliflozin (JARDIANCE) 10 MG TABS tablet    Sig: Take 10 mg by mouth  daily.    Dispense:  90 tablet    Refill:  3    [name], Patrick Patel [place]

## 2017-07-25 NOTE — Assessment & Plan Note (Signed)
Improved control. We'll check a BMP today. Continue current medications.

## 2017-07-26 ENCOUNTER — Telehealth: Payer: Self-pay

## 2017-07-26 LAB — BASIC METABOLIC PANEL
BUN: 34 mg/dL — ABNORMAL HIGH (ref 6–23)
CHLORIDE: 99 meq/L (ref 96–112)
CO2: 28 meq/L (ref 19–32)
Calcium: 9.5 mg/dL (ref 8.4–10.5)
Creatinine, Ser: 0.91 mg/dL (ref 0.40–1.50)
GFR: 86.34 mL/min (ref >60.00)
GLUCOSE: 162 mg/dL — AB (ref 70–99)
Potassium: 3.8 mEq/L (ref 3.5–5.1)
SODIUM: 140 meq/L (ref 135–145)

## 2017-07-26 MED ORDER — "INSULIN SYRINGE 31G X 5/16"" 0.3 ML MISC": 2 refills | Status: DC

## 2017-07-26 NOTE — Telephone Encounter (Signed)
Pharmacy states patient uses vials, please send in 31 gauge 8mm syringes

## 2017-07-26 NOTE — Telephone Encounter (Signed)
Sent to pharmacy 

## 2017-07-27 ENCOUNTER — Telehealth: Payer: Self-pay | Admitting: Family Medicine

## 2017-07-27 ENCOUNTER — Other Ambulatory Visit: Payer: Self-pay | Admitting: Family Medicine

## 2017-07-27 LAB — URINE CULTURE

## 2017-07-27 MED ORDER — NITROFURANTOIN MONOHYD MACRO 100 MG PO CAPS: 100.0000 mg | ORAL_CAPSULE | Freq: Two times a day (BID) | ORAL | 0 refills | Status: DC

## 2017-07-27 NOTE — Telephone Encounter (Signed)
See result note.  

## 2017-07-27 NOTE — Telephone Encounter (Signed)
Pt called returning your call. Thank you!  Call pt @ [phone].

## 2017-08-10 ENCOUNTER — Other Ambulatory Visit: Payer: Self-pay | Admitting: Family Medicine

## 2017-09-03 ENCOUNTER — Encounter: Payer: Self-pay | Admitting: Family Medicine

## 2017-09-03 ENCOUNTER — Ambulatory Visit (INDEPENDENT_AMBULATORY_CARE_PROVIDER_SITE_OTHER): Payer: Medicare Other | Admitting: Family Medicine

## 2017-09-03 VITALS — BP 150/70 | HR 91 | Temp 98.0°F | Wt 238.6 lb

## 2017-09-03 DIAGNOSIS — M791 Myalgia, unspecified site: Secondary | ICD-10-CM

## 2017-09-03 DIAGNOSIS — I25118 Atherosclerotic heart disease of native coronary artery with other forms of angina pectoris: Secondary | ICD-10-CM | POA: Diagnosis not present

## 2017-09-03 DIAGNOSIS — E119 Type 2 diabetes mellitus without complications: Secondary | ICD-10-CM

## 2017-09-03 DIAGNOSIS — E785 Hyperlipidemia, unspecified: Secondary | ICD-10-CM

## 2017-09-03 DIAGNOSIS — N390 Urinary tract infection, site not specified: Secondary | ICD-10-CM | POA: Diagnosis not present

## 2017-09-03 DIAGNOSIS — N401 Enlarged prostate with lower urinary tract symptoms: Secondary | ICD-10-CM | POA: Diagnosis not present

## 2017-09-03 DIAGNOSIS — R350 Frequency of micturition: Secondary | ICD-10-CM | POA: Diagnosis not present

## 2017-09-03 DIAGNOSIS — R300 Dysuria: Secondary | ICD-10-CM

## 2017-09-03 DIAGNOSIS — I10 Essential (primary) hypertension: Secondary | ICD-10-CM

## 2017-09-03 LAB — POCT URINALYSIS DIPSTICK
Bilirubin, UA: NEGATIVE
Blood, UA: NEGATIVE
Glucose, UA: 500
Leukocytes, UA: NEGATIVE
Nitrite, UA: NEGATIVE
PH UA: 5 (ref 5.0–8.0)
PROTEIN UA: NEGATIVE
SPEC GRAV UA: 1.015 (ref 1.010–1.025)
UROBILINOGEN UA: 0.2 U/dL

## 2017-09-03 LAB — POCT GLYCOSYLATED HEMOGLOBIN (HGB A1C): HEMOGLOBIN A1C: 7.6

## 2017-09-03 MED ORDER — INSULIN NPH (HUMAN) (ISOPHANE) 100 UNIT/ML ~~LOC~~ SUSP: 15.0000 [IU] | Freq: Every day | SUBCUTANEOUS | 2 refills | Status: DC

## 2017-09-03 MED ORDER — METFORMIN HCL ER 500 MG PO TB24: 2000.0000 mg | ORAL_TABLET | Freq: Every day | ORAL | 1 refills | Status: DC

## 2017-09-03 MED ORDER — EMPAGLIFLOZIN 25 MG PO TABS: 25.0000 mg | ORAL_TABLET | Freq: Every day | ORAL | 3 refills | Status: DC

## 2017-09-03 MED ORDER — ROSUVASTATIN CALCIUM 20 MG PO TABS: 20.0000 mg | ORAL_TABLET | Freq: Every day | ORAL | 3 refills | Status: DC

## 2017-09-03 NOTE — Assessment & Plan Note (Signed)
Patient notes chest pain is improved quite a bit. He does not want any further workup for this as workup including catheterization. He'll monitor his symptoms and if they worsen or become persistent he will contact EMS for transport to the ED.

## 2017-09-03 NOTE — Assessment & Plan Note (Addendum)
A1c close to goal. At this point I doubt is sulfonylurea is providing any benefit. We will discontinue this. We will change his metformin to extended release given his issues with loose stools. I discussed the potential for increased UTIs with Jardiance and discussed possibly increasing his dose. The patient noted he was willing to try this as he had reviewed the data and noted that the increased frequency was not a large percentage. We will refill his insulin at 15 units with breakfast.

## 2017-09-03 NOTE — Assessment & Plan Note (Addendum)
Patient with myalgias possibly related to Lipitor. We'll change to Crestor. He'll return for fasting lipid panel. Check CK as well.

## 2017-09-03 NOTE — Assessment & Plan Note (Signed)
Above goal today. Patient hesitant to add additional medications with the other changes we are making. He'll monitor for the next week and return for BP check with nursing.

## 2017-09-03 NOTE — Progress Notes (Addendum)
[name], MD Phone: [phone]  Patrick Patel is a 76 y.o. male who presents today for f/u.  Hypertension: Notes it usually runs similar. He is taking chlorthalidone. He notes his prior chest pain is actually somewhat improved. When he gets it it does not radiate. It typically occurs after eating within 30 minutes. No shortness of breath. He exerts himself extensively at times with no adverse effect. No edema. He feels as though the Vania Rea has helped with this chest pain.  Diabetes: taking NPH anywhere between 15 and 30 units. He takes metformin and Jardiance. He is taking glipizide 20 mg twice daily. He has been on glyburide previously to this. Total use of sulfonylurea 9 years. No hypoglycemia. He notes his sugars been running high up to 235.  Hyperlipidemia: Taking Lipitor. Has had significant myalgias for some time.  Patient was previously treated for UTI. He notes it went away and then came back and then got quite a bit better on its own. Occasionally his urine will be slightly cloudy. Occasional minor burning. Frequency is unchanged. Some intermittent urgency. Has not seen urology in some time now.   PMH: former smoker   ROS see history of present illness  Objective  Physical Exam Vitals:   09/03/17 1522  BP: (!) 150/70  Pulse: 91  Temp: 98 F (36.7 C)  SpO2: 97%    BP Readings from Last 3 Encounters:  09/03/17 (!) 150/70  07/25/17 140/68  05/30/17 140/64   Wt Readings from Last 3 Encounters:  09/03/17 238 lb 9.6 oz (108.2 kg)  07/25/17 237 lb 3.2 oz (107.6 kg)  05/30/17 236 lb 12.8 oz (107.4 kg)    Physical Exam  Constitutional: No distress.  Cardiovascular: Normal rate, regular rhythm and normal heart sounds.   Pulmonary/Chest: Effort normal and breath sounds normal.  Musculoskeletal: He exhibits no edema.  Neurological: He is alert.  Skin: Skin is warm and dry. He is not diaphoretic.     Assessment/Plan: Please see individual problem  list.  CAD (coronary artery disease) Patient notes chest pain is improved quite a bit. He does not want any further workup for this as workup including catheterization. He'll monitor his symptoms and if they worsen or become persistent he will contact EMS for transport to the ED.  Diabetes ([place]) A1c close to goal. At this point I doubt is sulfonylurea is providing any benefit. We will discontinue this. We will change his metformin to extended release given his issues with loose stools. I discussed the potential for increased UTIs with Jardiance and discussed possibly increasing his dose. The patient noted he was willing to try this as he had reviewed the data and noted that the increased frequency was not a large percentage. We will refill his insulin at 15 units with breakfast.  Hyperlipidemia Patient with myalgias possibly related to Lipitor. We'll change to Crestor. He'll return for fasting lipid panel. Check CK as well.  Dysuria Very mild dysuria. UA unremarkable. Sent for culture. Patient has had recurrent issues with UTI. Potentially could be related to the Jardiance though 2 of his episodes of UTI were prior to starting on the Jardiance. We'll refer to urology for further evaluation.  Essential hypertension Above goal today. Patient hesitant to add additional medications with the other changes we are making. He'll monitor for the next week and return for BP check with nursing.   Orders Placed This Encounter  Procedures  . Urine Culture  . Lipid panel    Standing Status:  Future    Standing Expiration Date:   09/03/2018  . CK (Creatine Kinase)    Standing Status:   Future    Standing Expiration Date:   09/03/2018  . Comp Met (CMET)    Standing Status:   Future    Standing Expiration Date:   09/03/2018  . Ambulatory referral to Urology    Referral Priority:   Routine    Referral Type:   Consultation    Referral Reason:   Specialty Services Required    Requested Specialty:    Urology    Number of Visits Requested:   1  . POCT HgB A1C  . POCT Urinalysis Dipstick    Meds ordered this encounter  Medications  . metFORMIN (GLUCOPHAGE-XR) 500 MG 24 hr tablet    Sig: Take 4 tablets (2,000 mg total) by mouth daily with breakfast.    Dispense:  360 tablet    Refill:  1  . rosuvastatin (CRESTOR) 20 MG tablet    Sig: Take 1 tablet (20 mg total) by mouth daily.    Dispense:  90 tablet    Refill:  3  . empagliflozin (JARDIANCE) 25 MG TABS tablet    Sig: Take 25 mg by mouth daily.    Dispense:  30 tablet    Refill:  3  . insulin NPH Human (HUMULIN N,NOVOLIN N) 100 UNIT/ML injection    Sig: Inject 0.15 mLs (15 Units total) into the skin daily before breakfast.    Dispense:  12 mL    Refill:  2    90 day supply approved    [name], MD [place]

## 2017-09-03 NOTE — Assessment & Plan Note (Addendum)
Very mild dysuria. UA unremarkable. Sent for culture. Patient has had recurrent issues with UTI. Potentially could be related to the Jardiance though 2 of his episodes of UTI were prior to starting on the Jardiance. We'll refer to urology for further evaluation.

## 2017-09-03 NOTE — Patient Instructions (Signed)
Nice to see you. Please check your blood pressure daily for the next week and then return in 1 week for nurse visit for blood pressure check. We'll call you with your urine results. We are going to switch you to Crestor. We will see if this helps with your muscle aches. When you return in 1 week will also do fasting lab work. We are going to have you discontinue the glipizide. We will switch you to metformin next are to see if that helps with the diarrhea. He should take this with food.

## 2017-09-06 ENCOUNTER — Other Ambulatory Visit: Payer: Self-pay | Admitting: Family Medicine

## 2017-09-06 LAB — URINE CULTURE
MICRO NUMBER:: 81054291
SPECIMEN QUALITY:: ADEQUATE

## 2017-09-06 MED ORDER — NITROFURANTOIN MONOHYD MACRO 100 MG PO CAPS: 100.0000 mg | ORAL_CAPSULE | Freq: Two times a day (BID) | ORAL | 0 refills | Status: DC

## 2017-09-11 ENCOUNTER — Telehealth: Payer: Self-pay | Admitting: *Deleted

## 2017-09-11 NOTE — Telephone Encounter (Signed)
Pt has requested a call at [phone]

## 2017-09-12 ENCOUNTER — Other Ambulatory Visit: Payer: Self-pay | Admitting: Family Medicine

## 2017-09-12 NOTE — Telephone Encounter (Signed)
See other message

## 2017-09-13 ENCOUNTER — Ambulatory Visit: Payer: Medicare Other

## 2017-09-13 ENCOUNTER — Other Ambulatory Visit: Payer: Medicare Other

## 2017-09-13 NOTE — Telephone Encounter (Signed)
OK to fill Kenalog cream last visit 09/03/17

## 2017-09-13 NOTE — Telephone Encounter (Signed)
Please find out why he still needs this. Thanks.

## 2017-09-17 NOTE — Telephone Encounter (Signed)
Sent to pharmacy 

## 2017-09-17 NOTE — Telephone Encounter (Signed)
Patient stated "for the same reason he has always needed it for his rash" and hung up the phone, tried to call back for more detail no answer.

## 2017-09-19 ENCOUNTER — Ambulatory Visit: Payer: Self-pay

## 2017-09-20 ENCOUNTER — Ambulatory Visit (INDEPENDENT_AMBULATORY_CARE_PROVIDER_SITE_OTHER): Payer: Medicare Other

## 2017-09-20 ENCOUNTER — Other Ambulatory Visit (INDEPENDENT_AMBULATORY_CARE_PROVIDER_SITE_OTHER): Payer: Medicare Other

## 2017-09-20 DIAGNOSIS — I10 Essential (primary) hypertension: Secondary | ICD-10-CM

## 2017-09-20 DIAGNOSIS — E119 Type 2 diabetes mellitus without complications: Secondary | ICD-10-CM | POA: Diagnosis not present

## 2017-09-20 DIAGNOSIS — M791 Myalgia, unspecified site: Secondary | ICD-10-CM | POA: Diagnosis not present

## 2017-09-20 DIAGNOSIS — E785 Hyperlipidemia, unspecified: Secondary | ICD-10-CM

## 2017-09-20 NOTE — Progress Notes (Signed)
Patient comes in for blood pressure check he is still taking chlorthalidone 50 mg daily. Patient reports blood pressure at home average reading  137-148/66-77.  Today Blood pressure left arm 142/72 right arm 146/76  Pulse 88 O2 95%.

## 2017-09-20 NOTE — Progress Notes (Signed)
BP is slightly above goal. I would suggest addition of another medication such as losartan. If he is willing I can send this to his pharmacy.

## 2017-09-21 LAB — LIPID PANEL
CHOL/HDL RATIO: 4
Cholesterol: 132 mg/dL (ref 0–200)
HDL: 30.9 mg/dL — ABNORMAL LOW (ref >39.00)
Triglycerides: 460 mg/dL — ABNORMAL HIGH (ref 0.0–149.0)

## 2017-09-21 LAB — COMPREHENSIVE METABOLIC PANEL
ALK PHOS: 58 U/L (ref 39–117)
ALT: 12 U/L (ref 0–53)
AST: 12 U/L (ref 0–37)
Albumin: 4.2 g/dL (ref 3.5–5.2)
BILIRUBIN TOTAL: 0.4 mg/dL (ref 0.2–1.2)
BUN: 22 mg/dL (ref 6–23)
CO2: 29 meq/L (ref 19–32)
Calcium: 8.7 mg/dL (ref 8.4–10.5)
Chloride: 102 mEq/L (ref 96–112)
Creatinine, Ser: 0.97 mg/dL (ref 0.40–1.50)
GFR: 80.17 mL/min (ref >60.00)
GLUCOSE: 200 mg/dL — AB (ref 70–99)
Potassium: 4.3 mEq/L (ref 3.5–5.1)
Sodium: 142 mEq/L (ref 135–145)
TOTAL PROTEIN: 7.2 g/dL (ref 6.0–8.3)

## 2017-09-21 LAB — LDL CHOLESTEROL, DIRECT: Direct LDL: 42 mg/dL

## 2017-09-21 LAB — CK: CK TOTAL: 81 U/L (ref 7–232)

## 2017-10-09 ENCOUNTER — Encounter: Payer: Self-pay | Admitting: Urology

## 2017-10-09 ENCOUNTER — Ambulatory Visit (INDEPENDENT_AMBULATORY_CARE_PROVIDER_SITE_OTHER): Payer: Medicare Other | Admitting: Urology

## 2017-10-09 VITALS — BP 155/73 | HR 91 | Ht 67.5 in | Wt 242.3 lb

## 2017-10-09 DIAGNOSIS — R339 Retention of urine, unspecified: Secondary | ICD-10-CM

## 2017-10-09 DIAGNOSIS — R350 Frequency of micturition: Secondary | ICD-10-CM | POA: Diagnosis not present

## 2017-10-09 DIAGNOSIS — N138 Other obstructive and reflux uropathy: Secondary | ICD-10-CM | POA: Diagnosis not present

## 2017-10-09 DIAGNOSIS — N401 Enlarged prostate with lower urinary tract symptoms: Secondary | ICD-10-CM

## 2017-10-09 DIAGNOSIS — N390 Urinary tract infection, site not specified: Secondary | ICD-10-CM | POA: Diagnosis not present

## 2017-10-09 LAB — MICROSCOPIC EXAMINATION
EPITHELIAL CELLS (NON RENAL): NONE SEEN /HPF (ref 0–10)
RBC MICROSCOPIC, UA: NONE SEEN /HPF (ref 0–?)

## 2017-10-09 LAB — URINALYSIS, COMPLETE
Bilirubin, UA: NEGATIVE
GLUCOSE, UA: NEGATIVE
KETONES UA: NEGATIVE
Nitrite, UA: NEGATIVE
Protein, UA: NEGATIVE
RBC, UA: NEGATIVE
SPEC GRAV UA: 1.015 (ref 1.005–1.030)
UUROB: 0.2 mg/dL (ref 0.2–1.0)
pH, UA: 5.5 (ref 5.0–7.5)

## 2017-10-09 LAB — BLADDER SCAN AMB NON-IMAGING: Scan Result: 885

## 2017-10-09 MED ORDER — NITROFURANTOIN MACROCRYSTAL 100 MG PO CAPS: 100.0000 mg | ORAL_CAPSULE | Freq: Every day | ORAL | 1 refills | Status: AC

## 2017-10-09 MED ORDER — ALFUZOSIN HCL ER 10 MG PO TB24: 10.0000 mg | ORAL_TABLET | Freq: Every day | ORAL | 11 refills | Status: DC

## 2017-10-09 NOTE — Progress Notes (Signed)
10/09/2017 3:21 PM   Patrick Patel [birth year] [id number]  Referring provider: [name], MD [address] [place], [place] [address]  Chief Complaint  Patient presents with  . Recurrent UTI    HPI: Patient seen today for lower urinary tract symptoms.  He's noted occasional cloudy urine with mild dysuria. Also, frequency and some intermittent urgency.  Patient had enterococcus on urine culture in August and September 2018. He's taken NF and Cipro. Symptoms resolve and then recur. He has occasional slow stream and feeling of incomplete emptying. He has to strain to void. NG risk includes DM and PN. He recalls having an elevated PVR 9-10 years ago and his bladder was distended on CT in 2006 (see report). Noc x 0. No incontinence.   His BUN was 22, creatinine 0.97 with a GFR of 80 October 2018.  UA today with many bacteria. PVR 885 ml.      PMH: Past Medical History:  Diagnosis Date  . Diabetes ([place])   . Hyperlipidemia   . Hypertension     Surgical History: No past surgical history on file.  Home Medications:  Allergies as of 10/09/2017      Reactions   Bactrim [sulfamethoxazole-trimethoprim] Swelling, Rash   Pioglitazone Swelling   Rosiglitazone Other (See Comments)   Ankle swelling      Medication List       Accurate as of 10/09/17  3:21 PM. Always use your most recent med list.          ACCU-CHEK SOFTCLIX LANCETS lancets Use to check blood glucose 3 times daily   acetaminophen 500 MG tablet Commonly known as:  TYLENOL Take 1,000 mg by mouth 2 (two) times daily at 10 AM and 5 PM.   aspirin EC 325 MG tablet Take 325 mg by mouth daily.   blood glucose meter kit and supplies Kit Dispense Accu-Chek Aviva plus. Check once daily as directed. For ICD 10 code E11.9.   chlorthalidone 50 MG tablet Commonly known as:  HYGROTON TAKE 1 TABLET BY MOUTH  DAILY   diphenhydrAMINE 25 MG tablet Commonly known as:  BENADRYL Take 50 mg by mouth at  bedtime as needed for sleep.   empagliflozin 25 MG Tabs tablet Commonly known as:  JARDIANCE Take 25 mg by mouth daily.   glucose blood test strip Commonly known as:  ACCU-CHEK AVIVA Use to check blood glucose 3 times daily   insulin NPH Human 100 UNIT/ML injection Commonly known as:  HUMULIN N,NOVOLIN N Inject 0.15 mLs (15 Units total) into the skin daily before breakfast.   INSULIN SYRINGE .3CC/31GX5/16" 31G X 5/16" 0.3 ML Misc Use once daily with insulin.   Magnesium 500 MG Caps Take 750 mg by mouth daily.   metFORMIN 500 MG 24 hr tablet Commonly known as:  GLUCOPHAGE-XR Take 4 tablets (2,000 mg total) by mouth daily with breakfast.   nitrofurantoin (macrocrystal-monohydrate) 100 MG capsule Commonly known as:  MACROBID Take 1 capsule (100 mg total) by mouth 2 (two) times daily.   rosuvastatin 20 MG tablet Commonly known as:  CRESTOR Take 1 tablet (20 mg total) by mouth daily.   triamcinolone cream 0.1 % Commonly known as:  KENALOG APPLY  CREAM EXTERNALLY TWICE DAILY AS NEEDED       Allergies:  Allergies  Allergen Reactions  . Bactrim [Sulfamethoxazole-Trimethoprim] Swelling and Rash  . Pioglitazone Swelling  . Rosiglitazone Other (See Comments)    Ankle swelling    Family History: No family history  on file.  Social History:  reports that he has quit smoking. His smokeless tobacco use includes Snuff. He reports that he does not drink alcohol or use drugs.  ROS: UROLOGY Frequent Urination?: Yes Hard to postpone urination?: Yes Burning/pain with urination?: No Get up at night to urinate?: No Leakage of urine?: No Urine stream starts and stops?: Yes Trouble starting stream?: Yes Do you have to strain to urinate?: Yes Blood in urine?: No Urinary tract infection?: No Sexually transmitted disease?: No Injury to kidneys or bladder?: No Painful intercourse?: No Weak stream?: No Erection problems?: No Penile pain?: No  Gastrointestinal Nausea?:  Yes Vomiting?: No Indigestion/heartburn?: Yes Diarrhea?: Yes Constipation?: Yes  Constitutional Fever: No Night sweats?: No Weight loss?: No Fatigue?: Yes  Skin Skin rash/lesions?: No Itching?: Yes  Eyes Blurred vision?: Yes Double vision?: No  Ears/Nose/Throat Sore throat?: No Sinus problems?: Yes  Hematologic/Lymphatic Swollen glands?: No Easy bruising?: No  Cardiovascular Leg swelling?: Yes Chest pain?: Yes  Respiratory Cough?: Yes Shortness of breath?: Yes  Endocrine Excessive thirst?: No  Musculoskeletal Back pain?: No Joint pain?: Yes  Neurological Headaches?: No Dizziness?: No  Psychologic Depression?: No Anxiety?: No  Physical Exam: BP (!) 155/73 (BP Location: Right Arm, Patient Position: Sitting, Cuff Size: Normal)   Pulse 91   Ht 5' 7.5" (1.715 m)   Wt 109.9 kg (242 lb 4.8 oz)   BMI 37.39 kg/m   Constitutional:  Alert and oriented, No acute distress. HEENT: [place] AT, moist mucus membranes.  Trachea midline, no masses. Cardiovascular: No clubbing, cyanosis, or edema. Respiratory: Normal respiratory effort, no increased work of breathing. GI: Abdomen is soft, nontender, nondistended, no abdominal masses GU: No CVA tenderness. Penis: uncircumcised, no phimosis or lesion Scrotum: normal, testes palpably normal  DRE: prostate about 40 grams, smooth, no hard area or nodule  Skin: No rashes, bruises or suspicious lesions. Lymph: No cervical or inguinal adenopathy. Neurologic: Grossly intact, no focal deficits, moving all 4 extremities. Psychiatric: Normal mood and affect.  Laboratory Data: Lab Results  Component Value Date   WBC 7.7 04/13/2016   HGB 13.9 04/13/2016   HCT 40.2 04/13/2016   MCV 91.2 04/13/2016   PLT 256.0 04/13/2016    Lab Results  Component Value Date   CREATININE 0.97 09/20/2017    No results found for: PSA1  No results found for: TESTOSTERONE  Lab Results  Component Value Date   HGBA1C 7.6 09/03/2017     Urinalysis Lab Results  Component Value Date   SPECGRAV 1.015 09/03/2017   PHUR 5.0 09/03/2017   COLORU yellow 09/03/2017   LEUKOCYTESUR Negative 09/03/2017   PROTEINUR negative 09/03/2017   KETONESU trace 09/03/2017   RBCU 3-6/hpf (A) 05/30/2017   BILIRUBINUR negative 09/03/2017   NITRITE negative 09/03/2017    Lab Results  Component Value Date   BACTERIA Many(>50/hpf) (A) 05/30/2017    Pertinent Imaging: No results found for this or any previous visit. No results found for this or any previous visit. No results found for this or any previous visit. No results found for this or any previous visit. No results found for this or any previous visit. No results found for this or any previous visit. No results found for this or any previous visit. No results found for this or any previous visit.  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  Start nightly NF during the evaluation.   2) bph with LUTS - start alfuzosin (sulfa allergy) and  recheck PVR.   3) incomplete bladder emptying - Screen upper tract for hydro/stone and f/u for repeat PVR and cystoscopy.   There are no diagnoses linked to this encounter.  No Follow-up on file.  Festus Aloe, [facility] [address], [place], [place] [address] [phone]

## 2017-10-10 NOTE — Progress Notes (Signed)
Left voice mail to call backLeft voice mail to call back 

## 2017-10-11 NOTE — Progress Notes (Signed)
Left voice mail to call back 

## 2017-10-11 NOTE — Progress Notes (Signed)
Spoke with patient urology started him on alfuzosin.

## 2017-10-23 ENCOUNTER — Other Ambulatory Visit: Payer: Self-pay | Admitting: Family Medicine

## 2017-10-25 ENCOUNTER — Telehealth: Payer: Self-pay | Admitting: Urology

## 2017-10-25 NOTE — Telephone Encounter (Signed)
Patient cx his cysto app said he would cb later when he was ready to reschd   Patrick DusterMichelle

## 2017-10-26 ENCOUNTER — Telehealth: Payer: Self-pay | Admitting: Urology

## 2017-10-26 NOTE — Telephone Encounter (Signed)
Certified letter sent. Tracking #- [id number].

## 2017-10-26 NOTE — Telephone Encounter (Signed)
He understands that he said he will call back to schedule this, but Leeroy BockChelsea you can still send the letter.   Marcelino DusterMichelle

## 2017-11-19 ENCOUNTER — Ambulatory Visit: Payer: Medicare Other | Admitting: Family Medicine

## 2017-11-26 ENCOUNTER — Other Ambulatory Visit: Payer: Medicare Other

## 2017-12-15 ENCOUNTER — Other Ambulatory Visit: Payer: Self-pay | Admitting: Family Medicine

## 2017-12-17 ENCOUNTER — Ambulatory Visit (INDEPENDENT_AMBULATORY_CARE_PROVIDER_SITE_OTHER): Payer: Medicare Other | Admitting: Family Medicine

## 2017-12-17 ENCOUNTER — Encounter: Payer: Self-pay | Admitting: Family Medicine

## 2017-12-17 ENCOUNTER — Other Ambulatory Visit: Payer: Self-pay

## 2017-12-17 VITALS — BP 150/70 | HR 85 | Temp 97.9°F | Wt 246.4 lb

## 2017-12-17 DIAGNOSIS — I10 Essential (primary) hypertension: Secondary | ICD-10-CM | POA: Diagnosis not present

## 2017-12-17 DIAGNOSIS — E785 Hyperlipidemia, unspecified: Secondary | ICD-10-CM | POA: Diagnosis not present

## 2017-12-17 DIAGNOSIS — E119 Type 2 diabetes mellitus without complications: Secondary | ICD-10-CM

## 2017-12-17 DIAGNOSIS — R300 Dysuria: Secondary | ICD-10-CM

## 2017-12-17 DIAGNOSIS — R221 Localized swelling, mass and lump, neck: Secondary | ICD-10-CM | POA: Insufficient documentation

## 2017-12-17 LAB — POCT URINALYSIS DIPSTICK
Bilirubin, UA: NEGATIVE
Blood, UA: NEGATIVE
GLUCOSE UA: NEGATIVE
KETONES UA: NEGATIVE
NITRITE UA: NEGATIVE
PROTEIN UA: NEGATIVE
SPEC GRAV UA: 1.015 (ref 1.010–1.025)
Urobilinogen, UA: 0.2 E.U./dL
pH, UA: 5 (ref 5.0–8.0)

## 2017-12-17 MED ORDER — NITROFURANTOIN MONOHYD MACRO 100 MG PO CAPS: 100.0000 mg | ORAL_CAPSULE | Freq: Two times a day (BID) | ORAL | 0 refills | Status: DC

## 2017-12-17 NOTE — Assessment & Plan Note (Addendum)
Patient is not quite yet due for an A1c.  We will have him return for labs in 2 days to recheck.  Discontinue Jardiance given recurrent UTIs.  Continue NPH, metformin, and glipizide for now.

## 2017-12-17 NOTE — Assessment & Plan Note (Signed)
No obvious abnormalities.  He does have a tobacco use history.  Will refer to ENT for evaluation.

## 2017-12-17 NOTE — Patient Instructions (Signed)
Nice to see you. We will refer you to ENT. Were going to treat you for a UTI and send her urine for culture. We will have you return in 2 days for lab work.

## 2017-12-17 NOTE — Assessment & Plan Note (Signed)
Patient with recurrent dysuria concerning for UTI.  Will empirically place him on Macrobid as this has been beneficial in the past.  He will continue alfuzosin.  Encouraged him to follow-up with urology to consider cystoscopy.  We will additionally discontinue Jardiance as this may be playing a role as well.

## 2017-12-17 NOTE — Progress Notes (Signed)
Patrick Rumps, MD Phone: [phone]  Patrick Patel is a 77 y.o. male who presents today for follow-up.  Dysuria: The patient has had recurrence of his dysuria.  He has had less volume as well as increased frequency.  Does note urgency.  No fevers.  He saw urology previously for evaluation given recurrent UTIs.  They placed him on prophylactic Macrobid.  They additionally placed him on alfuzosin.  He notes that has helped some with his BPH symptoms though seems to help less currently.  They wanted to do cystoscopy though the patient declined.  Patient reports a long history of intermittent UTIs.  Diabetes: Last check was 222.  Taking Jardiance, NPH, metformin, and glipizide.  He continued the glipizide despite me advising to discontinue it.  He also increase the NPH from 15 units once daily to 15 units twice daily.  He also decreased his Jardiance dose to 10 mg given some stomach upset that occurred with a 25 mg dose.  He notes no hypoglycemia.  No polydipsia.  Patient also reports he feels like there is a lump in his throat and has felt a ridge on the very posterior portion of his tongue.  Feels like food hangs in that area and then goes down.  Notes he has to try a little harder to swallow things.  Does use tobacco products.  Hypertension: Blood pressures typically around 142/80 at home.  He is taking his current medication.  No chest pain.  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  HENT:  Mouth/Throat: Oropharynx is clear and moist. No oropharyngeal exudate.  No lesions noted in the posterior oropharynx  Cardiovascular: Normal rate, regular rhythm and normal heart sounds.  Pulmonary/Chest: Effort normal and breath sounds normal.  Abdominal: Soft. Bowel sounds are normal. He exhibits no distension. There is no tenderness. There is no rebound and no guarding.  Musculoskeletal: He exhibits no edema.  Neurological: He is alert. Gait normal.  Skin: Skin is warm and dry. He is not diaphoretic.     Assessment/Plan: Please see individual problem list.  Essential hypertension Relatively well controlled at home.  He will continue to monitor and continue on his current regimen.  Diabetes [facility]) Patient is not quite yet due for an A1c.  We will have him return for labs in 2 days to recheck.  Discontinue Jardiance given recurrent UTIs.  Continue NPH, metformin, and glipizide for now.  Dysuria Patient with recurrent dysuria concerning for UTI.  Will empirically place him on Macrobid as this has been beneficial in the past.  He will continue alfuzosin.  Encouraged him to follow-up with urology to consider cystoscopy.  We will additionally discontinue Jardiance as this may be playing a role as well.  Lump in throat No obvious abnormalities.  He does have a tobacco use history.  Will refer to ENT  for evaluation.   Patrick Patel was seen today for follow-up and dysuria.  Diagnoses and all orders for this visit:  Dysuria -     POCT Urinalysis Dipstick -     Urine Culture -     Urine Microscopic Only  Type 2 diabetes mellitus without complication, without long-term current use of insulin (HCC) -     HgB A1c; Future  Hyperlipidemia, unspecified hyperlipidemia type -     Lipid panel; Future -     Comp Met (CMET); Future  Lump in throat -     Ambulatory referral to ENT  Essential hypertension  Other orders -     nitrofurantoin,  macrocrystal-monohydrate, (MACROBID) 100 MG capsule; Take 1 capsule (100 mg total) by mouth 2 (two) times daily.    Orders Placed This Encounter  Procedures  . Urine Culture  . Lipid panel    Standing Status:   Future    Standing Expiration Date:   12/17/2018  . HgB A1c    Standing Status:   Future    Standing Expiration Date:   12/17/2018  . Comp Met (CMET)    Standing Status:   Future    Standing Expiration Date:   12/17/2018  . Urine Microscopic Only  . Ambulatory referral to ENT    Referral Priority:   Routine    Referral Type:   Consultation    Referral Reason:   Specialty Services Required    Requested Specialty:   Otolaryngology    Number of Visits Requested:   1  . POCT Urinalysis Dipstick    Meds ordered this encounter  Medications  . nitrofurantoin, macrocrystal-monohydrate, (MACROBID) 100 MG capsule    Sig: Take 1 capsule (100 mg total) by mouth 2 (two) times daily.    Dispense:  10 capsule    Refill:  0     Patrick Rumps, MD [place]

## 2017-12-17 NOTE — Progress Notes (Signed)
gu

## 2017-12-17 NOTE — Assessment & Plan Note (Signed)
Relatively well controlled at home.  He will continue to monitor and continue on his current regimen.

## 2017-12-18 LAB — URINE CULTURE
MICRO NUMBER:: 90022989
SPECIMEN QUALITY:: ADEQUATE

## 2017-12-18 LAB — URINALYSIS, MICROSCOPIC ONLY: RBC / HPF: NONE SEEN (ref 0–?)

## 2017-12-19 ENCOUNTER — Encounter: Payer: Self-pay | Admitting: Family Medicine

## 2017-12-19 NOTE — Telephone Encounter (Signed)
-----   Message from Inetta FermoJessica S Hendricks, [place] sent at 12/19/2017 12:57 PM EST ----- Left message to return call, ok for PEC to give results and speak to patient

## 2017-12-19 NOTE — Telephone Encounter (Signed)
This encounter was created in error - please disregard.

## 2017-12-20 ENCOUNTER — Telehealth: Payer: Self-pay | Admitting: Radiology

## 2017-12-20 ENCOUNTER — Other Ambulatory Visit (INDEPENDENT_AMBULATORY_CARE_PROVIDER_SITE_OTHER): Payer: Medicare Other

## 2017-12-20 DIAGNOSIS — E785 Hyperlipidemia, unspecified: Secondary | ICD-10-CM | POA: Diagnosis not present

## 2017-12-20 DIAGNOSIS — E119 Type 2 diabetes mellitus without complications: Secondary | ICD-10-CM

## 2017-12-20 DIAGNOSIS — R300 Dysuria: Secondary | ICD-10-CM

## 2017-12-20 NOTE — Telephone Encounter (Signed)
He should have a urinalysis repeated.  I placed an order.  Thanks.

## 2017-12-20 NOTE — Telephone Encounter (Signed)
Pt came in today for labs and stated he was still having some burning and discomfort when urinating. Please advise.

## 2017-12-20 NOTE — Addendum Note (Signed)
Addended by: Glori LuisSONNENBERG, ERIC G on: 12/20/2017 04:54 PM   Modules accepted: Orders

## 2017-12-21 LAB — COMPREHENSIVE METABOLIC PANEL
ALT: 14 U/L (ref 0–53)
AST: 15 U/L (ref 0–37)
Albumin: 4.1 g/dL (ref 3.5–5.2)
Alkaline Phosphatase: 67 U/L (ref 39–117)
BUN: 26 mg/dL — ABNORMAL HIGH (ref 6–23)
CHLORIDE: 97 meq/L (ref 96–112)
CO2: 28 meq/L (ref 19–32)
Calcium: 9.2 mg/dL (ref 8.4–10.5)
Creatinine, Ser: 0.98 mg/dL (ref 0.40–1.50)
GFR: 79.18 mL/min (ref >60.00)
GLUCOSE: 214 mg/dL — AB (ref 70–99)
POTASSIUM: 3.7 meq/L (ref 3.5–5.1)
Sodium: 136 mEq/L (ref 135–145)
Total Bilirubin: 0.5 mg/dL (ref 0.2–1.2)
Total Protein: 7.4 g/dL (ref 6.0–8.3)

## 2017-12-21 LAB — HEMOGLOBIN A1C: Hgb A1c MFr Bld: 7.8 % — ABNORMAL HIGH (ref 4.6–6.5)

## 2017-12-21 LAB — LDL CHOLESTEROL, DIRECT: LDL DIRECT: 42 mg/dL

## 2017-12-21 LAB — LIPID PANEL
CHOLESTEROL: 143 mg/dL (ref 0–200)
HDL: 28.5 mg/dL — AB (ref >39.00)
Total CHOL/HDL Ratio: 5

## 2017-12-21 NOTE — Telephone Encounter (Signed)
Left message to return call to schedule patient for a repeat urine today if possible. Lab only appmt. Ok for [employer] to schedule

## 2017-12-25 NOTE — Telephone Encounter (Signed)
Left message to return call 

## 2017-12-27 NOTE — Telephone Encounter (Signed)
Left message to return call to see if he is still having urinary symptoms, ok for pec to speak to patient and schedule for a urine lab appointment

## 2017-12-28 NOTE — Telephone Encounter (Signed)
Noted.  Please contact him on Monday and see if he can come by and drop a urine off.

## 2017-12-28 NOTE — Telephone Encounter (Signed)
[facility]    Patrick SpanishKennedy, Patrick Patel Patrick Patel 02:29 PM   Pt call back and I tried to schedule his lab appt for urine he said he was going to drop by and give a sample I told he needed an appt and he said for a urine I said yes he hung up

## 2017-12-31 NOTE — Telephone Encounter (Signed)
Left message to return call, ok for pec to speak with patient and schedule for appt to drop urine off on lab schedule

## 2018-01-01 ENCOUNTER — Telehealth: Payer: Self-pay

## 2018-01-01 DIAGNOSIS — R300 Dysuria: Secondary | ICD-10-CM

## 2018-01-01 MED ORDER — GEMFIBROZIL 600 MG PO TABS: 600.0000 mg | ORAL_TABLET | Freq: Two times a day (BID) | ORAL | 1 refills | Status: DC

## 2018-01-01 MED ORDER — SITAGLIPTIN PHOSPHATE 100 MG PO TABS: 100.0000 mg | ORAL_TABLET | Freq: Every day | ORAL | 1 refills | Status: DC

## 2018-01-01 NOTE — Telephone Encounter (Signed)
Patient states he would like to go back on the nitrofurantoin nightly like he was on for 60 days from his urologist.He states this helped more then being on a 5 day antibiotic. Patient is coming in tomorrow for a UA as recommended. Please place orders. Patient would like to try janiuvia. Patient states he started taking the gemfibrozil, he would like [facility]

## 2018-01-01 NOTE — Telephone Encounter (Signed)
Unable to reach patient.

## 2018-01-01 NOTE — Telephone Encounter (Signed)
Patient needs to complete urinalysis tomorrow prior to this being considered.  He really should go back and see urology to complete his evaluation.  Thanks.

## 2018-01-01 NOTE — Telephone Encounter (Signed)
Medication sent to pharmacy.  Please advise the patient that if he develops muscle aches while on the gemfibrozil in combination with Crestor he needs to discontinue the gemfibrozil and let us know.

## 2018-01-02 ENCOUNTER — Other Ambulatory Visit (INDEPENDENT_AMBULATORY_CARE_PROVIDER_SITE_OTHER): Payer: Medicare Other

## 2018-01-02 DIAGNOSIS — R300 Dysuria: Secondary | ICD-10-CM | POA: Diagnosis not present

## 2018-01-02 LAB — POCT URINALYSIS DIPSTICK
BILIRUBIN UA: NEGATIVE
Blood, UA: NEGATIVE
Glucose, UA: >=1000
KETONES UA: NEGATIVE
Leukocytes, UA: NEGATIVE
Nitrite, UA: NEGATIVE
Protein, UA: NEGATIVE
Spec Grav, UA: 1.015 (ref 1.010–1.025)
UROBILINOGEN UA: 0.2 U/dL
pH, UA: 5.5 (ref 5.0–8.0)

## 2018-01-02 NOTE — Addendum Note (Signed)
Addended by: Penne LashWIGGINS, Daichi Moris N on: 01/02/2018 04:19 PM   Modules accepted: Orders

## 2018-01-02 NOTE — Telephone Encounter (Signed)
Left message to return call, ok for pec to speak to patient  Dr.Sonnenberg: Medication sent to pharmacy.  Please advise the patient that if he develops muscle aches while on the gemfibrozil in combination with Crestor he needs to discontinue the gemfibrozil and let us know.  Also please inform patient about his urinalysis, it did not show infection but we will send this for culture.ok for pec to give results

## 2018-01-03 LAB — URINE CULTURE
MICRO NUMBER: 90096723
Result:: NO GROWTH
SPECIMEN QUALITY: ADEQUATE

## 2018-01-08 NOTE — Telephone Encounter (Signed)
Unable to reach patient, sent mychart message

## 2018-01-17 ENCOUNTER — Other Ambulatory Visit: Payer: Self-pay | Admitting: Family Medicine

## 2018-01-22 DIAGNOSIS — Z1389 Encounter for screening for other disorder: Secondary | ICD-10-CM | POA: Diagnosis not present

## 2018-01-22 DIAGNOSIS — Z23 Encounter for immunization: Secondary | ICD-10-CM | POA: Diagnosis not present

## 2018-01-22 DIAGNOSIS — Z0000 Encounter for general adult medical examination without abnormal findings: Secondary | ICD-10-CM | POA: Diagnosis not present

## 2018-01-30 ENCOUNTER — Other Ambulatory Visit: Payer: Self-pay | Admitting: Family Medicine

## 2018-01-30 DIAGNOSIS — K143 Hypertrophy of tongue papillae: Secondary | ICD-10-CM | POA: Diagnosis not present

## 2018-02-01 DIAGNOSIS — R300 Dysuria: Secondary | ICD-10-CM | POA: Diagnosis not present

## 2018-02-19 ENCOUNTER — Telehealth: Payer: Self-pay | Admitting: Family Medicine

## 2018-02-19 NOTE — Telephone Encounter (Signed)
Copied from CRM [id number]. Topic: Quick Communication - See Telephone Encounter >> Feb 19, 2018  3:23 PM Guinevere FerrariMorris, Loribeth Katich E, NT wrote: CRM for notification. See Telephone encounter for: Patient called and wanted to see if he can change prescriptions to Jardiance 10mg  to 20 mg  two times a day. Patient said that since the prescriptions dosage's are over lacking doctor has to call [facility] for approval. [facility]  [facility] [place] [phone] (Phone) [phone] (Fax)    02/19/18.

## 2018-02-20 NOTE — Telephone Encounter (Signed)
fyi

## 2018-02-20 NOTE — Telephone Encounter (Signed)
Pt is returning call, he only wanted to speak to Panamajessica, not a member of the NT team. Please call pt back

## 2018-02-20 NOTE — Telephone Encounter (Signed)
Last office visit: 12/17/17

## 2018-02-20 NOTE — Telephone Encounter (Signed)
Patient phoned returning Jessica's call. He stated the Januvia caused his ankles to swell so he stopped taking. Also, said he could not tolerate the Jardiance 25 MG tab once daily so he went back to taking Jardiance 10 MG tabs twice a day. He states his sugars are 140, 198, 236 post meals from what I could ascertain. He is requesting for PCP to phone [facility] to approve an override so his insurance will pay for the Jardiance 10 MG tabs. He stated he is aware of the increased risk for UTI's on this medication.

## 2018-02-20 NOTE — Telephone Encounter (Signed)
Left message to return call, ok for pec to speak to patient 

## 2018-02-20 NOTE — Telephone Encounter (Signed)
We discontinued his Jardiance at his last visit.  Please find out if he has continued to take this.  Please clarify what his question is.  London PepperJardiance is typically a 10 mg once daily dose or a 25 mg once daily dose it is not twice daily medication.  Given his prior UTIs I do not recommend that he take the Jardiance.  Thanks.

## 2018-02-20 NOTE — Telephone Encounter (Signed)
Please advise 

## 2018-02-21 NOTE — Telephone Encounter (Signed)
Given his history of recurrent UTIs I would not place him back on Jardiance.  I will forward to [place] as well to see if she can provide any insight as to what diabetic medications will be most affordable and appropriate for him given that he has been intolerant of Januvia and he has had recurrent UTIs.  He is already on metformin and a sulfonylurea and NPH.  Thanks.

## 2018-02-21 NOTE — Telephone Encounter (Signed)
Left message to return call ok for pec to speak to patient as I am having a hard time reaching patient per phone

## 2018-02-21 NOTE — Telephone Encounter (Signed)
Left message to return call. Dr.Sonnenberg please advise on message below

## 2018-02-21 NOTE — Telephone Encounter (Signed)
Patient notified and states he is not having recurrent uti's anymore, he states he is already taking jardiance 10 mg  twice daily

## 2018-02-23 NOTE — Telephone Encounter (Signed)
Plan to discuss further with Ray[place] Citizenaroline on Monday.

## 2018-02-25 NOTE — Telephone Encounter (Signed)
Recommend Trulicity, d/c januvia in this patient - a1C nearly controlled and does not likely need addition of prandial insulin at this time. This is covered by his insurance and is the same tier as Januvia. Once [place] d/c'd, no difference in cost for monthly drug expenditure.  Regarding SGLT2-I - Should not be any difference in Jardiance 25 mg daily vs 10 mg BID. Recommend discuss with patient and consider d/c if having UTI/yeast infection sx. I feel comfortable treating 1 UTI, then consider d/c thereafter.   Allena Katzaroline E Sharel Behne, Pharm.D., BCPS PGY2 Ambulatory Care Pharmacy Resident Phone: [phone]

## 2018-02-25 NOTE — Telephone Encounter (Signed)
Discussed with Rayfield Citizenaroline.  Advised of patient's recurrent history of UTIs in the past.  We both agreed that London PepperJardiance is not an option given his recurrent UTIs.  I would suggest Trulicity after discussing with her.  This is a once weekly injection.  Confirm with him that he has no personal or family history of thyroid cancer, adrenal cancer, or parathyroid cancer.  As long as he does not have history of this we could use the Trulicity and then discontinue his Januvia.  There should not be a difference in cost between the Januvia and the Trulicity.  Thanks.

## 2018-02-26 NOTE — Telephone Encounter (Signed)
Left message to return call ok for pec to speak to patient about message below 

## 2018-02-27 ENCOUNTER — Ambulatory Visit: Payer: Self-pay

## 2018-02-27 NOTE — Telephone Encounter (Signed)
Pt returning call about message that was left on voicemail. Pt requesting not to be called back today due to him leaving the house.

## 2018-02-28 NOTE — Telephone Encounter (Signed)
Patient is returning phone call. Phone line is busy, please contact pt.  [phone]

## 2018-02-28 NOTE — Telephone Encounter (Signed)
Patient returned call to office and states that he really does not want to go on the Trulilcity because he is doing well on the Jardicance and it helps with his swelling along with the Chlorthalidone. He thinks it would be best for him he doesn't know how the Trulicity will work and states that he has not had any UTI's for several months now. He has an appointment with Dr. Birdie SonsSonnenberg on April 9,2019. He states he's willing to take suggestions and would like a call back after 4 tomorrow. He does mention that both parents had cancer and mother had thyroid issues. Please advise

## 2018-02-28 NOTE — Telephone Encounter (Signed)
Attempted to relay message of Dr. Birdie SonsSonnenberg to pt but pt states that he is not take Januvia due to ankle swelling and has been taking Jardiance even though medication was discontinued. Pt states he does not have a history of thyroid,adrenal or parathyroid cancer. Pt states his father had lung cancer and his mother had a history of breast cancer and non Hodgkin's lymphoma and also had a big knot in her throat, that he states he was not aware if this was thyroid cancer or not. Pt states he does not want to take Trulicity and wants to continue to take Jardiance. Pt states that taking 25mg  of Jardiance hurts his stomach and he has been taking 10mg  in the morning and another 10mg  in the evening.  Patina, at [place] contacted and conference call initiated in order to relay the message to the pt.

## 2018-03-01 NOTE — Telephone Encounter (Signed)
fyi

## 2018-03-02 NOTE — Telephone Encounter (Signed)
At this point he can continue the Jardiance 10 mg twice daily.  If he has another UTI we will have to discontinue this medication.  Please inform him of this.  Thanks.

## 2018-03-04 ENCOUNTER — Other Ambulatory Visit: Payer: Self-pay | Admitting: Family Medicine

## 2018-03-04 MED ORDER — EMPAGLIFLOZIN 10 MG PO TABS: 10.0000 mg | ORAL_TABLET | Freq: Two times a day (BID) | ORAL | 2 refills | Status: DC

## 2018-03-04 NOTE — Telephone Encounter (Signed)
Patient states that he will start jardiance 10 mg twice daily and verbalized understanding that if he get another UTI then he will have to discontinue the medication.

## 2018-03-04 NOTE — Telephone Encounter (Signed)
Jardiance sent to pharmacy.  If he has signs of UTI he should discontinue this and contact us.  Thanks.

## 2018-03-04 NOTE — Telephone Encounter (Signed)
Left message to return call, ok for pec to speak to patient 

## 2018-03-06 NOTE — Telephone Encounter (Signed)
Patient notified and verbalized understanding. 

## 2018-03-11 ENCOUNTER — Telehealth: Payer: Self-pay

## 2018-03-11 NOTE — Telephone Encounter (Signed)
Prior authorization completed for Jardiance 10 mg. Take 1 table daily by mouth. on 03-11-18.  Key: [id number]

## 2018-03-19 ENCOUNTER — Other Ambulatory Visit: Payer: Self-pay

## 2018-03-19 ENCOUNTER — Encounter: Payer: Self-pay | Admitting: Family Medicine

## 2018-03-19 ENCOUNTER — Ambulatory Visit (INDEPENDENT_AMBULATORY_CARE_PROVIDER_SITE_OTHER): Payer: Medicare Other | Admitting: Family Medicine

## 2018-03-19 VITALS — BP 138/64 | HR 86 | Temp 97.5°F | Ht 68.0 in | Wt 248.4 lb

## 2018-03-19 DIAGNOSIS — E1140 Type 2 diabetes mellitus with diabetic neuropathy, unspecified: Secondary | ICD-10-CM

## 2018-03-19 DIAGNOSIS — R300 Dysuria: Secondary | ICD-10-CM | POA: Diagnosis not present

## 2018-03-19 DIAGNOSIS — R221 Localized swelling, mass and lump, neck: Secondary | ICD-10-CM

## 2018-03-19 DIAGNOSIS — Z794 Long term (current) use of insulin: Secondary | ICD-10-CM | POA: Diagnosis not present

## 2018-03-19 DIAGNOSIS — E785 Hyperlipidemia, unspecified: Secondary | ICD-10-CM | POA: Diagnosis not present

## 2018-03-19 DIAGNOSIS — R0989 Other specified symptoms and signs involving the circulatory and respiratory systems: Secondary | ICD-10-CM | POA: Diagnosis not present

## 2018-03-19 LAB — POCT URINALYSIS DIPSTICK
BILIRUBIN UA: NEGATIVE
Blood, UA: NEGATIVE
Glucose, UA: >=1000
Leukocytes, UA: NEGATIVE
Nitrite, UA: NEGATIVE
PH UA: 5 (ref 5.0–8.0)
PROTEIN UA: NEGATIVE
Spec Grav, UA: 1.01 (ref 1.010–1.025)
UROBILINOGEN UA: 0.2 U/dL

## 2018-03-19 MED ORDER — EMPAGLIFLOZIN 10 MG PO TABS: 10.0000 mg | ORAL_TABLET | Freq: Two times a day (BID) | ORAL | 2 refills | Status: DC

## 2018-03-19 MED ORDER — TRIAMCINOLONE ACETONIDE 0.1 % EX CREA: TOPICAL_CREAM | CUTANEOUS | 0 refills | Status: DC

## 2018-03-19 MED ORDER — "INSULIN SYRINGE 31G X 5/16"" 0.3 ML MISC": 2 refills | Status: DC

## 2018-03-19 NOTE — Assessment & Plan Note (Signed)
Currently asymptomatic.  We will check a urinalysis.

## 2018-03-19 NOTE — Patient Instructions (Addendum)
Nice to see you. We will check a urine and contact you with the results. Return in 1 week for lab work.

## 2018-03-19 NOTE — Progress Notes (Signed)
Patrick Rumps, MD Phone: [phone]  Patrick Patel is a 77 y.o. male who presents today for f/u.   DIABETES Disease Monitoring: Blood Sugar ranges-137-218 Polyuria/phagia/dipsia- dipsia if eats excessive carbs      Optho-needs to see Medications: Compliance- taking jardiance, glipizide, humulin, metformin Hypoglycemic symptoms- no  Patient had issues with recurrent UTIs previously.  He has had no recent symptoms.  He would like to check his urine to see if this went away.  No recent dysuria.  No frequency.  Rare urgency.  He remains on alfuzosin for his prostate and this has been beneficial.  It has helped with his flow.  He notes no leakage.  He states he saw ENT for the lump in his throat and he reports they advised him it was nothing bad and they gave him a mouthwash to gargle with.  He notes this has improved.   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  Cardiovascular: Normal rate, regular rhythm and normal heart sounds.  Pulmonary/Chest: Effort normal and breath sounds normal.  Musculoskeletal: He exhibits no edema.  Neurological: He is alert.  Skin: Skin is warm and dry. He is not diaphoretic.   Diabetic Foot Exam - Simple   Simple Foot Form Diabetic Foot exam was performed with the following findings:  Yes 03/19/2018  4:36 PM  Visual Inspection See comments:  Yes Sensation Testing See comments:  Yes Pulse Check See comments:   Yes Comments Thickened toenails bilaterally with some scaling of the skin between his toes, no ulcerations or other skin breakdown, decreased monofilament testing in bilateral toes, otherwise intact to light touch and monofilament testing, decreased PT and DP pulses bilaterally     Assessment/Plan: Please see individual problem list.  Type 2 diabetes mellitus with diabetic neuropathy, unspecified ([place]) Continue current regimen.  He will return for fasting lab work in 1 week as he has not yet due for labs.  Urine microalbumin collected.  Ophthalmology referral placed.  Foot exam completed.  He does have some diabetic neuropathy with decreased sensation that has been chronic.  Discussed closely monitoring his feet and that he should not walk around in bare feet.  If he develops foot issues he will contact us or be evaluated quickly.  Lump in throat Improved.  He has seen ENT.  Dysuria Currently asymptomatic.  We will check a urinalysis.  Decreased pedal pulses Noted on diabetic foot exam.  Will obtain ABIs.   Health Maintenance: We will request cologuard results from his prior clinic.  Orders Placed This Encounter  Procedures  . HgB A1c    Standing Status:   Future    Standing Expiration Date:   03/20/2019  . Lipid panel    Standing Status:   Future    Standing Expiration Date:   03/20/2019  . Comp Met (CMET)    Standing Status:  Future    Standing Expiration Date:   03/20/2019  . Urine Microalbumin w/creat. ratio  . Ambulatory referral to Ophthalmology    Referral Priority:   Routine    Referral Type:   Consultation    Referral Reason:   Specialty Services Required    Requested Specialty:   Ophthalmology    Number of Visits Requested:   1  . POCT Urinalysis Dipstick    Meds ordered this encounter  Medications  . triamcinolone cream (KENALOG) 0.1 %    Sig: APPLY  CREAM EXTERNALLY TWICE DAILY AS NEEDED    Dispense:  45 g    Refill:  0    Please consider 90 day supplies to  promote better adherence  . Insulin Syringe-Needle U-100 (INSULIN SYRINGE .3CC/31GX5/16") 31G X 5/16" 0.3 ML MISC    Sig: Use twice daily with insulin.    Dispense:  180 each    Refill:  2  . empagliflozin (JARDIANCE) 10 MG TABS tablet    Sig: Take 10 mg by mouth 2 (two) times daily.    Dispense:  180 tablet    Refill:  2     Patrick Rumps, MD [place]

## 2018-03-19 NOTE — Assessment & Plan Note (Signed)
Improved.  He has seen ENT.

## 2018-03-19 NOTE — Assessment & Plan Note (Addendum)
Continue current regimen.  He will return for fasting lab work in 1 week as he has not yet due for labs.  Urine microalbumin collected.  Ophthalmology referral placed.  Foot exam completed.  He does have some diabetic neuropathy with decreased sensation that has been chronic.  Discussed closely monitoring his feet and that he should not walk around in bare feet.  If he develops foot issues he will contact us or be evaluated quickly.

## 2018-03-19 NOTE — Assessment & Plan Note (Signed)
Noted on diabetic foot exam.  Will obtain ABIs.

## 2018-03-20 LAB — MICROALBUMIN / CREATININE URINE RATIO
CREATININE, U: 61.8 mg/dL
MICROALB/CREAT RATIO: 2.1 mg/g (ref 0.0–30.0)
Microalb, Ur: 1.3 mg/dL (ref 0.0–1.9)

## 2018-03-21 ENCOUNTER — Encounter: Payer: Self-pay | Admitting: Family Medicine

## 2018-03-26 ENCOUNTER — Other Ambulatory Visit (INDEPENDENT_AMBULATORY_CARE_PROVIDER_SITE_OTHER): Payer: Medicare Other

## 2018-03-26 DIAGNOSIS — Z794 Long term (current) use of insulin: Secondary | ICD-10-CM | POA: Diagnosis not present

## 2018-03-26 DIAGNOSIS — E785 Hyperlipidemia, unspecified: Secondary | ICD-10-CM | POA: Diagnosis not present

## 2018-03-26 DIAGNOSIS — E1140 Type 2 diabetes mellitus with diabetic neuropathy, unspecified: Secondary | ICD-10-CM

## 2018-03-27 LAB — LIPID PANEL
CHOL/HDL RATIO: 3
CHOLESTEROL: 88 mg/dL (ref 0–200)
HDL: 28.6 mg/dL — AB (ref >39.00)
NonHDL: 59.08
TRIGLYCERIDES: 237 mg/dL — AB (ref 0.0–149.0)
VLDL: 47.4 mg/dL — AB (ref 0.0–40.0)

## 2018-03-27 LAB — LDL CHOLESTEROL, DIRECT: Direct LDL: 34 mg/dL

## 2018-03-27 LAB — HEMOGLOBIN A1C: Hgb A1c MFr Bld: 7.3 % — ABNORMAL HIGH (ref 4.6–6.5)

## 2018-03-27 LAB — COMPREHENSIVE METABOLIC PANEL
ALBUMIN: 4 g/dL (ref 3.5–5.2)
ALT: 18 U/L (ref 0–53)
AST: 21 U/L (ref 0–37)
Alkaline Phosphatase: 46 U/L (ref 39–117)
BUN: 20 mg/dL (ref 6–23)
CALCIUM: 9.2 mg/dL (ref 8.4–10.5)
CO2: 26 meq/L (ref 19–32)
Chloride: 101 mEq/L (ref 96–112)
Creatinine, Ser: 0.87 mg/dL (ref 0.40–1.50)
GFR: 90.77 mL/min (ref >60.00)
Glucose, Bld: 173 mg/dL — ABNORMAL HIGH (ref 70–99)
POTASSIUM: 3.7 meq/L (ref 3.5–5.1)
Sodium: 139 mEq/L (ref 135–145)
Total Bilirubin: 0.4 mg/dL (ref 0.2–1.2)
Total Protein: 6.9 g/dL (ref 6.0–8.3)

## 2018-04-01 ENCOUNTER — Other Ambulatory Visit: Payer: Self-pay | Admitting: Family Medicine

## 2018-04-04 ENCOUNTER — Telehealth: Payer: Self-pay | Admitting: Family Medicine

## 2018-04-04 DIAGNOSIS — Z1212 Encounter for screening for malignant neoplasm of rectum: Secondary | ICD-10-CM

## 2018-04-04 DIAGNOSIS — Z1211 Encounter for screening for malignant neoplasm of colon: Secondary | ICD-10-CM

## 2018-04-04 NOTE — Telephone Encounter (Signed)
Please let the patient know he had a fecal occult blood test done previously and this is only good for a year. He is currently due for colon cancer screening. We could refer to GI for colonoscopy or complete a cologuard if he meets criteria. Thanks.

## 2018-04-05 ENCOUNTER — Telehealth: Payer: Self-pay | Admitting: Family Medicine

## 2018-04-05 NOTE — Telephone Encounter (Signed)
Left message to return call, ok for pec to speak to patient 

## 2018-04-05 NOTE — Telephone Encounter (Signed)
Please see if the patient has a history of colon polyps, family history of colon cancer, or if he has blood in his stool.  If the answer is no to all of these we can place an order for Cologuard.

## 2018-04-05 NOTE — Telephone Encounter (Signed)
Patient was notified by pec and would like to do cologuard, ok to order?

## 2018-04-05 NOTE — Telephone Encounter (Signed)
See other message

## 2018-04-05 NOTE — Telephone Encounter (Signed)
Message from Dr. Birdie SonsSonnenberg read to patient; verbalizes understanding. Would like to do cologuard testing.  [phone]

## 2018-04-08 NOTE — Telephone Encounter (Signed)
Left message to return call, ok for pec to speak to patient 

## 2018-04-08 NOTE — Addendum Note (Signed)
Addended by: Inetta Fermo on: 04/08/2018 03:59 PM   Modules accepted: Orders

## 2018-04-08 NOTE — Telephone Encounter (Signed)
FYI

## 2018-04-08 NOTE — Telephone Encounter (Signed)
Attempted to call pt regarding questions concerning colon polyps and cancer. No answer, left message for pt to call the office back.

## 2018-04-08 NOTE — Telephone Encounter (Signed)
Patient states he does not have a history of colon polyps, no family history of colon cancer and blood in his stools. Cologuard ordered and placed on Dr.Sonnenbergs desk to sign.

## 2018-04-09 IMAGING — CT CT CHEST HIGH RESOLUTION W/O CM
1 of 4 series · 14 of 33 positions shown, 18 images · non-contrast
Comparison: No priors.

CLINICAL DATA: 74-year-old male with complaint of worsening
shortness of breath for 1 year. Former smoker (quit 18 years ago).

EXAM:
CT CHEST WITHOUT CONTRAST
TECHNIQUE: Multidetector CT imaging of the chest was performed following the
standard protocol without intravenous contrast. High resolution
imaging of the lungs, as well as inspiratory and expiratory imaging,
was performed.

[Series 2: thorax · axial · 0.78mm/px · z∈[-622,-376]mm · 14 of 139 slices shown, 18 images]
[im 8/139  mediastinal]
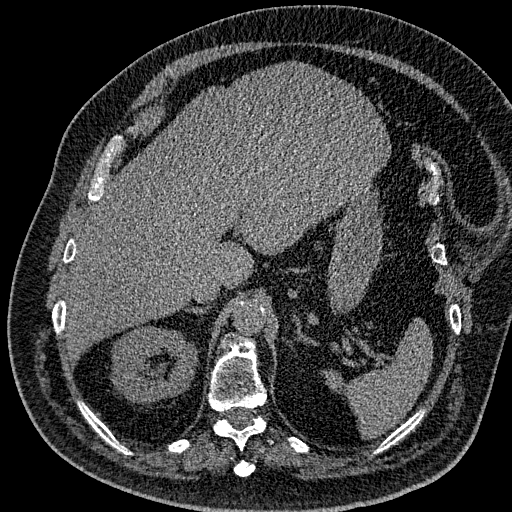
[im 8/139  lung]
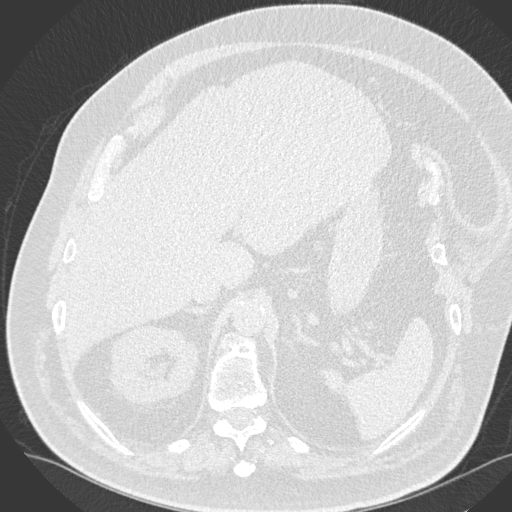
[im 22/139  lung]
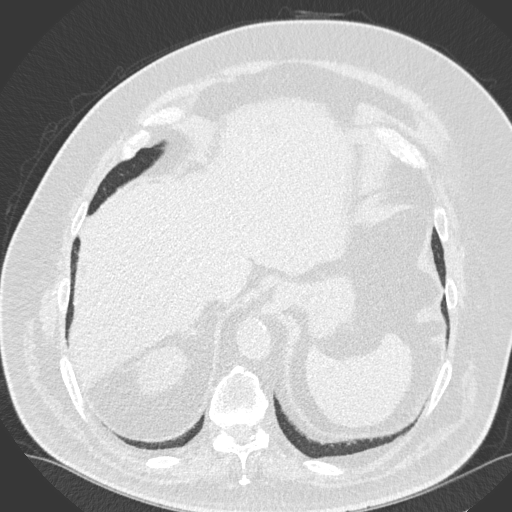
[im 30/139  lung]
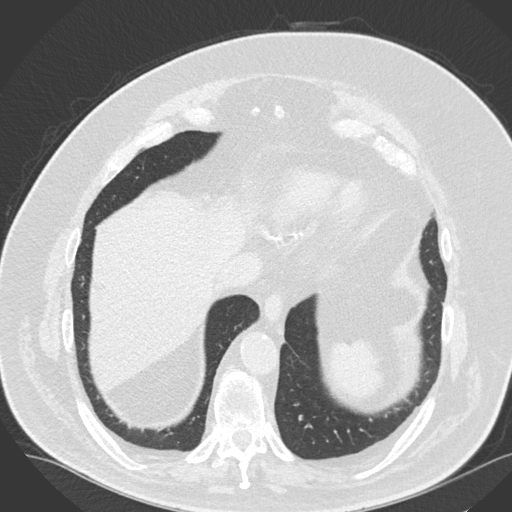
[im 44/139  lung]
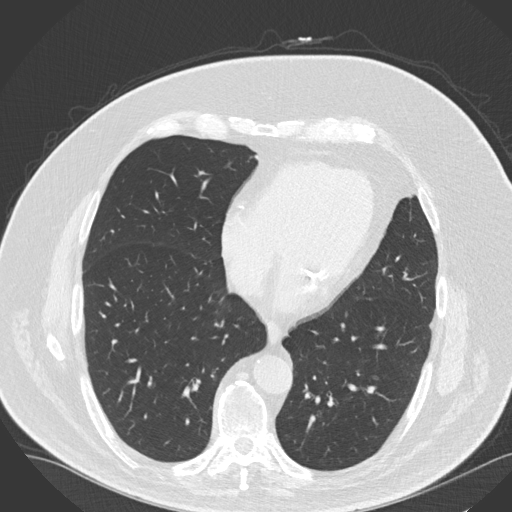
[im 51/139  mediastinal]
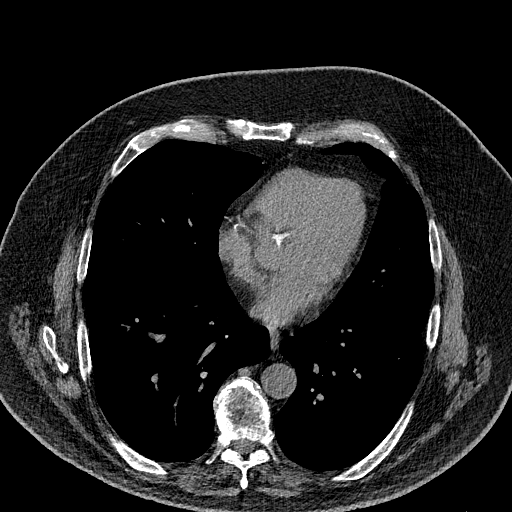
[im 51/139  lung]
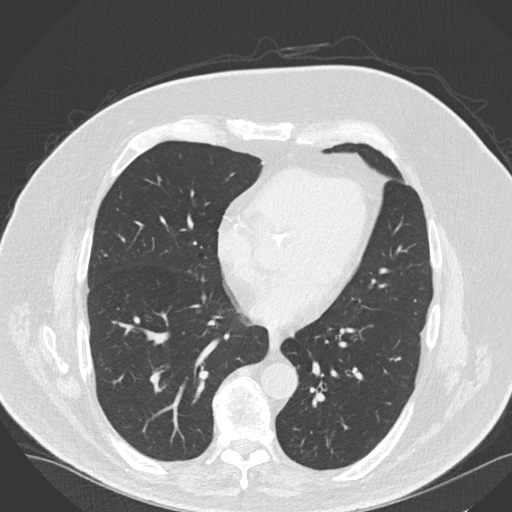
[im 59/139  lung]
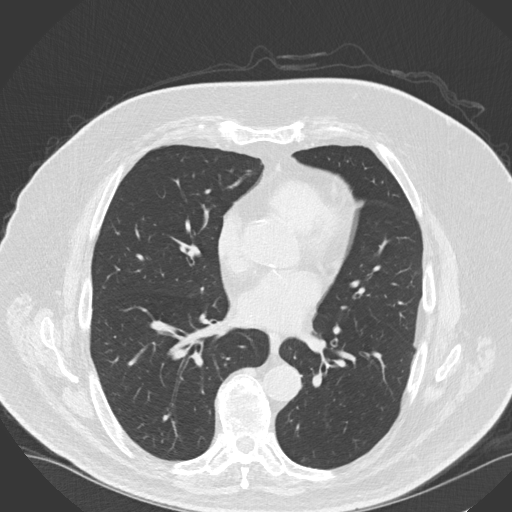
[im 66/139  lung]
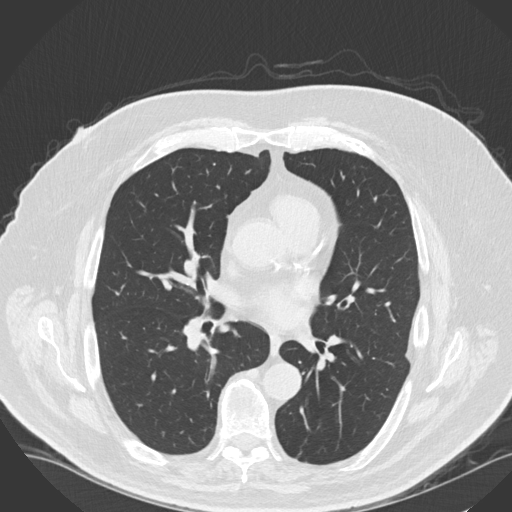
[im 70/139  lung]
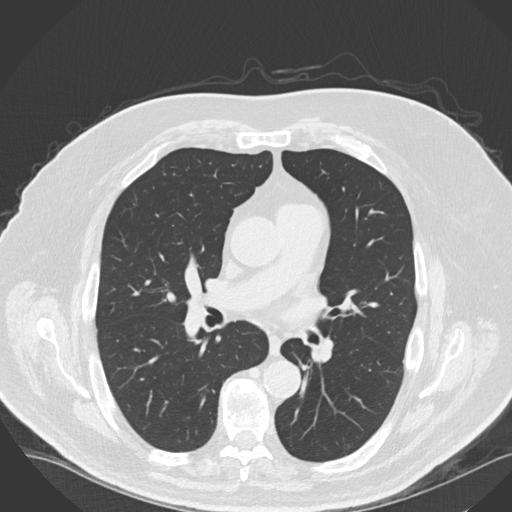
[im 80/139  mediastinal]
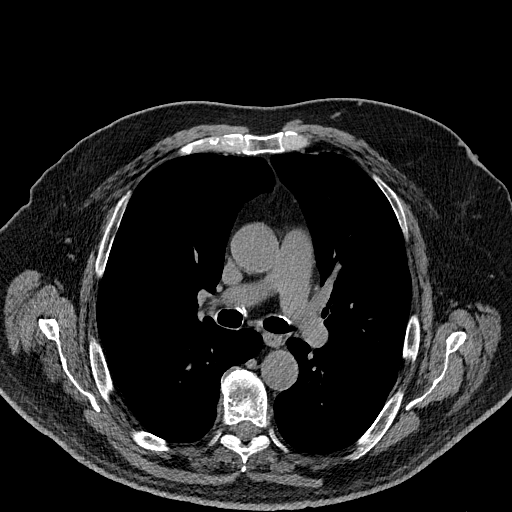
[im 80/139  lung]
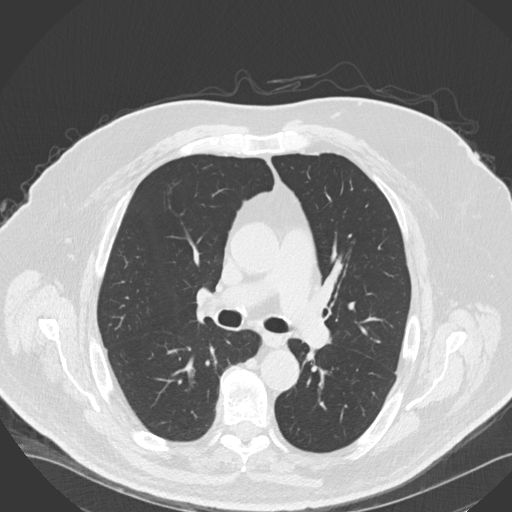
[im 88/139  lung]
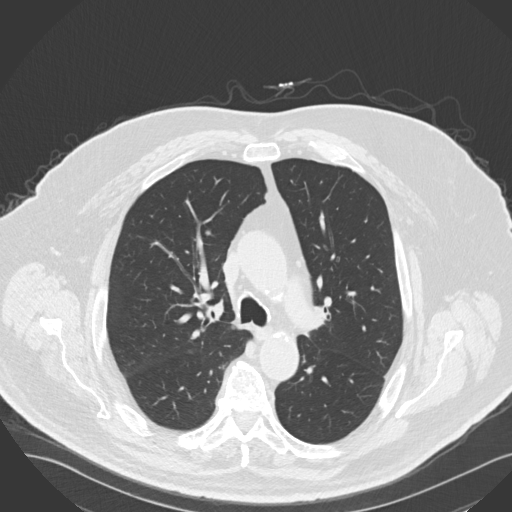
[im 102/139  lung]
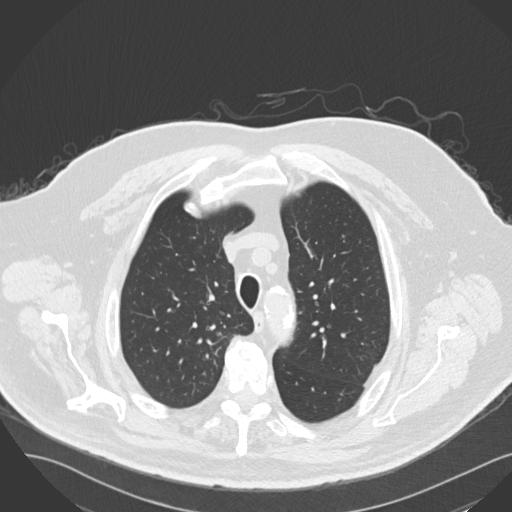
[im 109/139  lung]
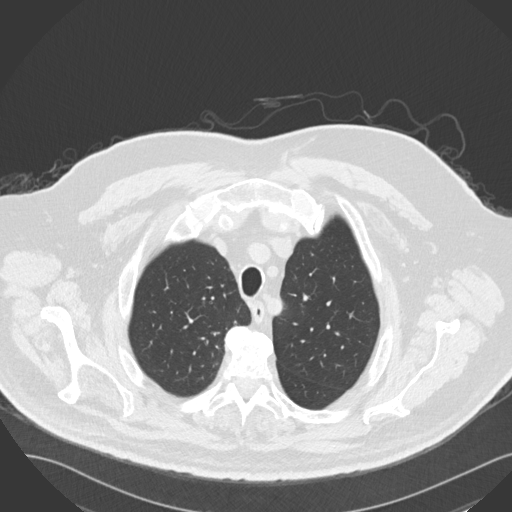
[im 117/139  mediastinal]
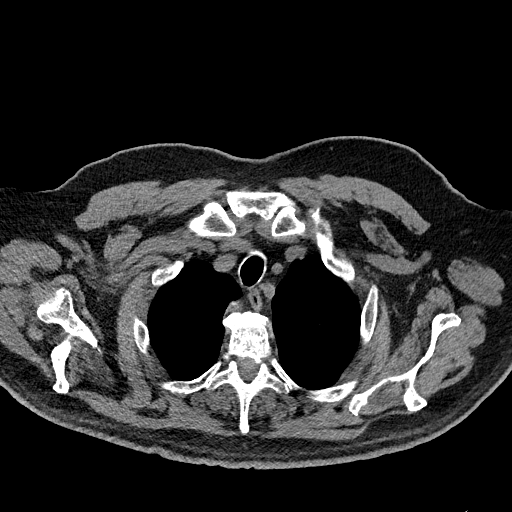
[im 117/139  lung]
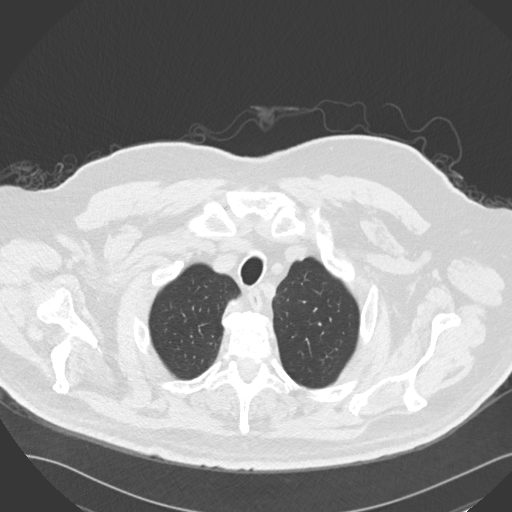
[im 131/139  lung]
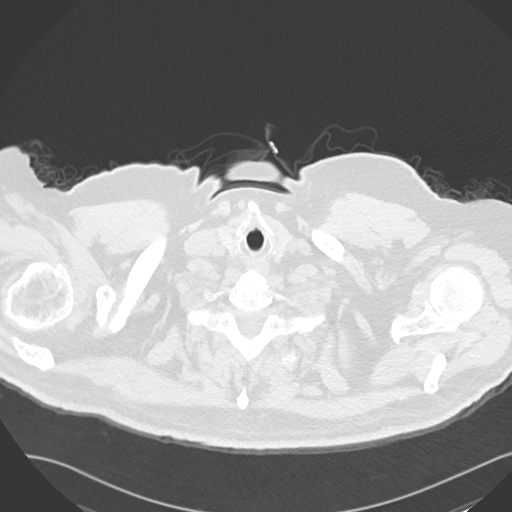

[14 of 33 positions shown; findings below may reference images not displayed]

FINDINGS: Mediastinum/Lymph Nodes: Heart size is normal. There is no
significant pericardial fluid, thickening or pericardial
calcification. There is atherosclerosis of the thoracic aorta, the
great vessels of the mediastinum and the coronary arteries,
including calcified atherosclerotic plaque in the left main, left
anterior descending, left circumflex and right coronary arteries.
Calcifications of the aortic valve. No pathologically enlarged
mediastinal or hilar lymph nodes. Please note that accurate
exclusion of hilar adenopathy is limited on noncontrast CT scans.
Esophagus is unremarkable in appearance. No axillary
lymphadenopathy.

Lungs/Pleura: High-resolution images demonstrate no areas of
significant ground-glass attenuation, subpleural reticulation,
parenchymal banding, traction bronchiectasis or frank honeycombing
to indicate interstitial lung disease. Inspiratory and expiratory
imaging is unremarkable. No acute consolidative airspace disease. No
pleural effusions. No suspicious appearing pulmonary nodules or
masses.

Upper abdomen: 2.3 cm low-attenuation lesion in the left lobe of the
liver is incompletely characterized on today's noncontrast CT
examination, but is similar to remote prior study from 04/20/2005,
presumably a cyst. Diffuse low attenuation throughout the hepatic
parenchyma, compatible with hepatic steatosis. Incompletely
visualized low-attenuation lesion in the right kidney, also not
characterized on today's examination, but similar to prior study
from 2773, presumably a cyst. Atherosclerosis.

Musculoskeletal: Flowing anterior osteophytes throughout the
thoracic spine suggestive of diffuse idiopathic skeletal
hyperostosis (DISH). There are no aggressive appearing lytic or
blastic lesions noted in the visualized portions of the skeleton.
IMPRESSION: 1. No findings to suggest interstitial lung disease.
2. No acute findings in the thorax.
3. Atherosclerosis, including left main and 3 vessel coronary artery
disease. Assessment for potential risk factor modification, dietary
therapy or pharmacologic therapy may be warranted, if clinically
indicated.
4. There are calcifications of the aortic valve. Echocardiographic
correlation for evaluation of potential valvular dysfunction may be
warranted if clinically indicated.
5. DISH.

## 2018-04-26 ENCOUNTER — Other Ambulatory Visit: Payer: Self-pay | Admitting: Family Medicine

## 2018-05-31 ENCOUNTER — Other Ambulatory Visit: Payer: Self-pay | Admitting: Family Medicine

## 2018-06-03 ENCOUNTER — Other Ambulatory Visit: Payer: Self-pay | Admitting: Family Medicine

## 2018-06-19 ENCOUNTER — Encounter: Payer: Self-pay | Admitting: Family Medicine

## 2018-06-19 ENCOUNTER — Ambulatory Visit (INDEPENDENT_AMBULATORY_CARE_PROVIDER_SITE_OTHER): Payer: Medicare Other | Admitting: Family Medicine

## 2018-06-19 VITALS — BP 128/62 | HR 89 | Temp 98.1°F | Ht 68.0 in | Wt 248.0 lb

## 2018-06-19 DIAGNOSIS — N401 Enlarged prostate with lower urinary tract symptoms: Secondary | ICD-10-CM

## 2018-06-19 DIAGNOSIS — Z1211 Encounter for screening for malignant neoplasm of colon: Secondary | ICD-10-CM | POA: Diagnosis not present

## 2018-06-19 DIAGNOSIS — M7540 Impingement syndrome of unspecified shoulder: Secondary | ICD-10-CM

## 2018-06-19 DIAGNOSIS — E1140 Type 2 diabetes mellitus with diabetic neuropathy, unspecified: Secondary | ICD-10-CM | POA: Diagnosis not present

## 2018-06-19 DIAGNOSIS — R079 Chest pain, unspecified: Secondary | ICD-10-CM | POA: Diagnosis not present

## 2018-06-19 DIAGNOSIS — R3916 Straining to void: Secondary | ICD-10-CM | POA: Diagnosis not present

## 2018-06-19 DIAGNOSIS — Z794 Long term (current) use of insulin: Secondary | ICD-10-CM

## 2018-06-19 DIAGNOSIS — I10 Essential (primary) hypertension: Secondary | ICD-10-CM

## 2018-06-19 DIAGNOSIS — Z125 Encounter for screening for malignant neoplasm of prostate: Secondary | ICD-10-CM | POA: Diagnosis not present

## 2018-06-19 MED ORDER — ALFUZOSIN HCL ER 10 MG PO TB24: 10.0000 mg | ORAL_TABLET | Freq: Every day | ORAL | 11 refills | Status: DC

## 2018-06-19 MED ORDER — INSULIN NPH (HUMAN) (ISOPHANE) 100 UNIT/ML ~~LOC~~ SUSP: SUBCUTANEOUS | 1 refills | Status: DC

## 2018-06-19 NOTE — Patient Instructions (Addendum)
Nice to see you. We will have you return in 1 week for labs. We will get you to see orthopedics for your shoulders. If you get to the point where you have difficulty urinating and cannot empty your bladder you need to be evaluated. If you develop persistent chest pain, shortness of breath, or any new or changing symptoms please be evaluated immediately.  If you decide you want to see cardiology again please let us know.

## 2018-06-25 NOTE — Assessment & Plan Note (Signed)
He will return for an A1c.  Continue current regimen. 

## 2018-06-25 NOTE — Assessment & Plan Note (Signed)
Well-controlled.  Continue current regimen. 

## 2018-06-25 NOTE — Assessment & Plan Note (Signed)
Shoulder issues likely related to impingement of the rotator cuff.  We will refer to orthopedics for evaluation.

## 2018-06-25 NOTE — Assessment & Plan Note (Signed)
Patient continues to have intermittent chest pain and dyspnea on exertion.  He has refused further cardiac evaluation for this.  He will monitor his symptoms.  Return precautions given.

## 2018-06-25 NOTE — Assessment & Plan Note (Signed)
Continue Uroxatrol.  Discussed referral back to urology though he does not want a procedure.  Check PSA.

## 2018-06-25 NOTE — Progress Notes (Signed)
Marikay Alar, MD Phone: [phone]  Patrick Patel is a 77 y.o. male who presents today for f/u.  CC: DM, BPH, HTN, left shoulder pain  DIABETES Disease Monitoring: Blood Sugar ranges-120-190 Polyuria/phagia/dipsia- rare polydipsia      Optho- due, previously referred Medications: Compliance- taking jardiance, glipizide, NPH 15 in am and 10 in pm, metformin Hypoglycemic symptoms- no  HYPERTENSION  Disease Monitoring  Home BP Monitoring not checking Chest pain- see below    Dyspnea- see below Medications  Compliance-  Taking chlorthalidone.   BPH: Flow- sometimes good Frequency- no Urgency- some Dysuria- occasionally Emptying bladder- not every time Medication- uroxatral Patient has previously seen urology for this.  It sounds as though they recommended a procedure and he has refused this.  He does not want to see them again.  Chronic chest pain: Patient has had chronic chest pain for some time.  He does occasionally get short of breath.  He has not had any today.  His symptoms typically occur with exertion and resolve with rest.  It has not gotten worse from previously.  He has seen cardiology and a catheterization was recommended which he has refused.  Left shoulder pain, chronic: Patient notes this has been bothering him for some time.  Makes it hard for him to sleep.  Reaching up causes pain.  He does report an injury 5 to 6 years ago.  He reports having had an x-ray since then that was negative for fracture.  No other injuries.    Social History   Tobacco Use  Smoking Status Former Smoker  Smokeless Tobacco Biochemist, clinical  . Types: Snuff     ROS see history of present illness  Objective  Physical Exam Vitals:   06/19/18 1606  BP: 128/62  Pulse: 89  Temp: 98.1 F (36.7 C)  SpO2: 96%    BP Readings from Last 3 Encounters:  06/19/18 128/62  03/19/18 138/64  12/17/17 (!) 150/70   Wt Readings from Last 3 Encounters:  06/19/18 248 lb (112.5 kg)    03/19/18 248 lb 6.4 oz (112.7 kg)  12/17/17 246 lb 6.4 oz (111.8 kg)    Physical Exam  Constitutional: No distress.  Cardiovascular: Normal rate, regular rhythm and normal heart sounds.  Pulmonary/Chest: Effort normal and breath sounds normal.  Musculoskeletal: He exhibits no edema.  Left shoulder with no tenderness, there is some discomfort on active and passive range of motion in all ranges, right shoulder with no tenderness, full ranges of motion actively and passively  Neurological: He is alert.  Skin: Skin is warm and dry. He is not diaphoretic.   EKG: Sinus rhythm with first-degree AV block, rate 93, nonspecific T wave abnormality, no change from prior  Assessment/Plan: Please see individual problem list.  Essential hypertension Well-controlled.  Continue current regimen.  Type 2 diabetes mellitus with diabetic neuropathy, unspecified (HCC) He will return for an A1c.  Continue current regimen.  Rotator cuff impingement syndrome Shoulder issues likely related to impingement of the rotator cuff.  We will refer to orthopedics for evaluation.  BPH (benign prostatic hyperplasia) Continue Uroxatrol.  Discussed referral back to urology though he does not want a procedure.  Check PSA.  Chest pain Patient continues to have intermittent chest pain and dyspnea on exertion.  He has refused further cardiac evaluation for this.  He will monitor his symptoms.  Return precautions given.  Health maintenance: Stool cards given for colon cancer screening.  Orders Placed This Encounter  Procedures  . Fecal  occult blood, imunochemical    Standing Status:   Future    Standing Expiration Date:   06/20/2019  . Hemoglobin A1c    Standing Status:   Future    Standing Expiration Date:   06/20/2019  . PSA, Medicare    Standing Status:   Future    Standing Expiration Date:   06/20/2019  . Ambulatory referral to Orthopedic Surgery    Referral Priority:   Routine    Referral Type:   Surgical     Referral Reason:   Specialty Services Required    Requested Specialty:   Orthopedic Surgery    Number of Visits Requested:   1  . EKG 12-Lead    Meds ordered this encounter  Medications  . alfuzosin (UROXATRAL) 10 MG 24 hr tablet    Sig: Take 1 tablet (10 mg total) by mouth daily with breakfast.    Dispense:  30 tablet    Refill:  11  . insulin NPH Human (HUMULIN N) 100 UNIT/ML injection    Sig: INJECT 15 UNITS IN THE AM AND 10 UNITS AT BEDTIME    Dispense:  10 mL    Refill:  1     Marikay AlarEric Kandon Hosking, MD [facility] [place] [facility]

## 2018-07-01 ENCOUNTER — Other Ambulatory Visit: Payer: Medicare Other

## 2018-07-02 ENCOUNTER — Other Ambulatory Visit (INDEPENDENT_AMBULATORY_CARE_PROVIDER_SITE_OTHER): Payer: Medicare Other

## 2018-07-02 DIAGNOSIS — Z794 Long term (current) use of insulin: Secondary | ICD-10-CM | POA: Diagnosis not present

## 2018-07-02 DIAGNOSIS — Z125 Encounter for screening for malignant neoplasm of prostate: Secondary | ICD-10-CM | POA: Diagnosis not present

## 2018-07-02 DIAGNOSIS — E1140 Type 2 diabetes mellitus with diabetic neuropathy, unspecified: Secondary | ICD-10-CM | POA: Diagnosis not present

## 2018-07-03 ENCOUNTER — Other Ambulatory Visit: Payer: Self-pay

## 2018-07-03 ENCOUNTER — Other Ambulatory Visit (INDEPENDENT_AMBULATORY_CARE_PROVIDER_SITE_OTHER): Payer: Medicare Other

## 2018-07-03 DIAGNOSIS — Z1211 Encounter for screening for malignant neoplasm of colon: Secondary | ICD-10-CM | POA: Diagnosis not present

## 2018-07-03 LAB — HEMOGLOBIN A1C: HEMOGLOBIN A1C: 7 % — AB (ref 4.6–6.5)

## 2018-07-03 LAB — FECAL OCCULT BLOOD, IMMUNOCHEMICAL: Fecal Occult Bld: NEGATIVE

## 2018-07-03 LAB — PSA, MEDICARE: PSA: 4.31 ng/mL — AB (ref 0.10–4.00)

## 2018-07-03 MED ORDER — ROSUVASTATIN CALCIUM 20 MG PO TABS
20.0000 mg | ORAL_TABLET | Freq: Every day | ORAL | 3 refills | Status: DC
Start: 2018-07-03 — End: 2019-07-28

## 2018-07-03 MED ORDER — GLIPIZIDE 10 MG PO TABS: ORAL_TABLET | ORAL | 3 refills | Status: DC

## 2018-07-03 MED ORDER — METFORMIN HCL ER 500 MG PO TB24: ORAL_TABLET | ORAL | 1 refills | Status: DC

## 2018-07-03 MED ORDER — TRIAMCINOLONE ACETONIDE 0.1 % EX CREA: TOPICAL_CREAM | CUTANEOUS | 0 refills | Status: DC

## 2018-07-03 MED ORDER — CHLORTHALIDONE 50 MG PO TABS: 50.0000 mg | ORAL_TABLET | Freq: Every day | ORAL | 1 refills | Status: DC

## 2018-07-04 ENCOUNTER — Telehealth: Payer: Self-pay | Admitting: Family Medicine

## 2018-07-04 NOTE — Telephone Encounter (Signed)
Copied from CRM [id number]. Topic: Quick Communication - Lab Results >> Jul 04, 2018  3:39 PM Inetta FermoHendricks, Jessica S, CMA wrote: Left message to return call, ok for [facility] to give results and speak to patient  >> Jul 04, 2018  4:47 PM Zada GirtLander, Lumin L wrote: No [facility] RN free for results.

## 2018-07-05 NOTE — Telephone Encounter (Signed)
Left VM for pt to return call for results.

## 2018-07-08 DIAGNOSIS — M7542 Impingement syndrome of left shoulder: Secondary | ICD-10-CM | POA: Diagnosis not present

## 2018-07-08 DIAGNOSIS — M75101 Unspecified rotator cuff tear or rupture of right shoulder, not specified as traumatic: Secondary | ICD-10-CM | POA: Diagnosis not present

## 2018-07-08 DIAGNOSIS — M25511 Pain in right shoulder: Secondary | ICD-10-CM | POA: Diagnosis not present

## 2018-07-08 DIAGNOSIS — M25512 Pain in left shoulder: Secondary | ICD-10-CM | POA: Diagnosis not present

## 2018-07-08 DIAGNOSIS — M19012 Primary osteoarthritis, left shoulder: Secondary | ICD-10-CM | POA: Diagnosis not present

## 2018-07-08 NOTE — Telephone Encounter (Signed)
See result note.  

## 2018-07-08 NOTE — Telephone Encounter (Signed)
Patient is asking that a voicemail is left explaining the below message if no answer.

## 2018-07-08 NOTE — Telephone Encounter (Signed)
Patient would like you to give have Shanda BumpsJessica call him back.

## 2018-07-08 NOTE — Telephone Encounter (Signed)
Patient is satisfied with lab result view on mychart however he would like a call back from GoodmanJessica to find out why a lipid panel was not completed.

## 2018-07-24 ENCOUNTER — Other Ambulatory Visit: Payer: Self-pay | Admitting: Family Medicine

## 2018-07-31 DIAGNOSIS — R300 Dysuria: Secondary | ICD-10-CM | POA: Diagnosis not present

## 2018-07-31 DIAGNOSIS — N390 Urinary tract infection, site not specified: Secondary | ICD-10-CM | POA: Diagnosis not present

## 2018-07-31 DIAGNOSIS — K047 Periapical abscess without sinus: Secondary | ICD-10-CM | POA: Diagnosis not present

## 2018-08-21 ENCOUNTER — Other Ambulatory Visit: Payer: Self-pay | Admitting: Family Medicine

## 2018-08-25 ENCOUNTER — Other Ambulatory Visit: Payer: Self-pay | Admitting: Family Medicine

## 2018-08-28 ENCOUNTER — Telehealth: Payer: Self-pay | Admitting: Family Medicine

## 2018-08-28 NOTE — Telephone Encounter (Addendum)
Copied from CRM [id number]. Topic: Quick Communication - See Telephone Encounter >> Aug 28, 2018  3:27 PM Trula SladeWalter, Linda F wrote: CRM for notification. See Telephone encounter for: 08/28/18. Patient would like to have his metFORMIN (GLUCOPHAGE-XR) 500 MG 24 hr tablet medication refilled and [facility] [address]

## 2018-08-28 NOTE — Telephone Encounter (Signed)
Called patient regarding his refill for metformin 500 mg 24 hr tab. Explained to patient that he has refills and to notify his pharmacy. Pt voiced understanding and will call back for any concerns.

## 2018-09-03 ENCOUNTER — Telehealth: Payer: Self-pay | Admitting: Family Medicine

## 2018-09-03 MED ORDER — ACCU-CHEK SOFTCLIX LANCETS MISC: 2 refills | Status: DC

## 2018-09-03 MED ORDER — GLUCOSE BLOOD VI STRP: ORAL_STRIP | 3 refills | Status: DC

## 2018-09-03 NOTE — Telephone Encounter (Signed)
rx request 

## 2018-09-03 NOTE — Telephone Encounter (Signed)
Pharmacy called regarding the patient's request for diabetes supplies. They are requesting the prescription for the BD U-100 insulin syringe to read twice a day.  Pharmacy also requesting directions on prescriptions for test strips and lancets.  Prescriptions resent for test strips and lancets.

## 2018-09-03 NOTE — Telephone Encounter (Signed)
Copied from CRM [id number]. Topic: General - Other >> Sep 03, 2018 12:39 PM Ronney LionArrington, Shykila A wrote: Medication: ACCU-CHEK AVIVA PLUS test strip , ACCU-CHEK SOFTCLIX LANCETS lancets   Has the patient contacted their pharmacy? Yes  (Agent: If yes, when and what did the pharmacy advise? Patient is saying the pharmacy is stating they are not receiving the orders for these medications, that they have been sending requests via fax 4 times and are still not receiving order.  Our records do show that it was confirmed and received.   Preferred Pharmacy (with phone number or street name): [facility] [id number] [facility] [place] [address]  [phone] (Phone) [phone] (Fax)    Agent: Please be advised that RX refills may take up to 3 business days. We ask that you follow-up with your pharmacy.

## 2018-09-04 ENCOUNTER — Telehealth: Payer: Self-pay | Admitting: Family Medicine

## 2018-09-04 MED ORDER — "INSULIN SYRINGE-NEEDLE U-100 31G X 5/16"" 0.3 ML MISC": 2 refills | Status: DC

## 2018-09-04 NOTE — Addendum Note (Signed)
Addended by: Gracelyn Nurse on: 09/04/2018 04:27 PM   Modules accepted: Orders

## 2018-09-04 NOTE — Telephone Encounter (Signed)
[facility]

## 2018-09-04 NOTE — Telephone Encounter (Signed)
RX FOR BD insulin syringe HAS BEEN SENT FOR TWICE DAILY

## 2018-09-04 NOTE — Telephone Encounter (Signed)
[facility] [place]. I spoke with [facility] [address] in the pharmacy. They have the order but needed to know how many times the patient was receiving insulin per day. Melody states she would change order to state twice a day with insulin ( Pt using NPH morning and evening) per order 08/27/18. Melody will call patient for pick up of BD insulin Syringes

## 2018-09-04 NOTE — Telephone Encounter (Signed)
Copied from CRM [id number]. Topic: Quick Communication - See Telephone Encounter >> Sep 04, 2018  3:38 PM Tamela Oddi, NT wrote: CRM for notification. See Telephone encounter for: 09/04/18. Patient called and states that the Insulin Syringe-Needle U-100 (BD INSULIN SYRINGE U/F) 31G X 5/16" 0.3 ML MISC was not received by his pharmacy . Please re send again  [facility] [address] [place] - [address] [phone] (Phone) [phone] (Fax)

## 2018-09-25 ENCOUNTER — Other Ambulatory Visit: Payer: Self-pay | Admitting: Family Medicine

## 2018-10-01 ENCOUNTER — Other Ambulatory Visit: Payer: Self-pay | Admitting: Family Medicine

## 2018-10-03 DIAGNOSIS — E119 Type 2 diabetes mellitus without complications: Secondary | ICD-10-CM | POA: Diagnosis not present

## 2018-10-03 LAB — HM DIABETES EYE EXAM

## 2018-10-04 ENCOUNTER — Ambulatory Visit: Payer: Medicare Other | Admitting: Family Medicine

## 2018-10-09 ENCOUNTER — Other Ambulatory Visit: Payer: Self-pay | Admitting: Family Medicine

## 2018-10-09 ENCOUNTER — Other Ambulatory Visit: Payer: Self-pay

## 2018-10-09 MED ORDER — INSULIN NPH (HUMAN) (ISOPHANE) 100 UNIT/ML ~~LOC~~ SUSP: SUBCUTANEOUS | 0 refills | Status: DC

## 2018-10-09 NOTE — Telephone Encounter (Signed)
Pharmacy stated that pt's insurance is requesting for a 90 day supply Rx for his Human N   Last OV 06/19/2018   Last refilled 10/30.2019 30 ML with 1 refill    Rx sent for a 90 day supply

## 2018-10-25 ENCOUNTER — Ambulatory Visit (INDEPENDENT_AMBULATORY_CARE_PROVIDER_SITE_OTHER): Payer: Medicare Other | Admitting: Family Medicine

## 2018-10-25 ENCOUNTER — Ambulatory Visit (INDEPENDENT_AMBULATORY_CARE_PROVIDER_SITE_OTHER): Payer: Medicare Other

## 2018-10-25 ENCOUNTER — Encounter: Payer: Self-pay | Admitting: Family Medicine

## 2018-10-25 VITALS — BP 144/62 | HR 93 | Temp 97.5°F | Resp 15 | Ht 68.0 in | Wt 251.4 lb

## 2018-10-25 DIAGNOSIS — R0609 Other forms of dyspnea: Secondary | ICD-10-CM

## 2018-10-25 DIAGNOSIS — I10 Essential (primary) hypertension: Secondary | ICD-10-CM

## 2018-10-25 DIAGNOSIS — R0602 Shortness of breath: Secondary | ICD-10-CM | POA: Diagnosis not present

## 2018-10-25 DIAGNOSIS — R3916 Straining to void: Secondary | ICD-10-CM

## 2018-10-25 DIAGNOSIS — R300 Dysuria: Secondary | ICD-10-CM

## 2018-10-25 DIAGNOSIS — B351 Tinea unguium: Secondary | ICD-10-CM

## 2018-10-25 DIAGNOSIS — E1140 Type 2 diabetes mellitus with diabetic neuropathy, unspecified: Secondary | ICD-10-CM

## 2018-10-25 DIAGNOSIS — N401 Enlarged prostate with lower urinary tract symptoms: Secondary | ICD-10-CM

## 2018-10-25 DIAGNOSIS — Z794 Long term (current) use of insulin: Secondary | ICD-10-CM | POA: Diagnosis not present

## 2018-10-25 DIAGNOSIS — S90221A Contusion of right lesser toe(s) with damage to nail, initial encounter: Secondary | ICD-10-CM | POA: Insufficient documentation

## 2018-10-25 LAB — POCT URINALYSIS DIPSTICK
Bilirubin, UA: NEGATIVE
GLUCOSE UA: NEGATIVE
NITRITE UA: NEGATIVE
PROTEIN UA: NEGATIVE
SPEC GRAV UA: 1.01 (ref 1.010–1.025)
Urobilinogen, UA: 0.2 E.U./dL
pH, UA: 5 (ref 5.0–8.0)

## 2018-10-25 NOTE — Assessment & Plan Note (Addendum)
Potentially cardiac in nature as he does have known CAD.  Could be COPD related as he does report he smoked for a long period of time.  EKG is reassuring.  We will check lab work.  We will check a chest x-ray.  Return precautions and AVS.  Determine next step in management based off of labs and chest x-ray.

## 2018-10-25 NOTE — Progress Notes (Signed)
[name], MD Phone: [phone]  Ebenezer D Kolton is a 77 y.o. male who presents today for f/u.  CC: htn, DM, dysuria  Hypertension: Not checking at home.  He is taking chlorthalidone.  He notes his chronic intermittent chest pain is not as frequent as previously.  It is improved.  He does note increased dyspnea on exertion over a number of months.  He notes no wheezing.  Rare cough.  Can occur if he is just sitting there at times though other times it only occurs when he is exerting himself.  He does note chronic edema in his lower extremities that comes and goes.  He does note possibly some orthopnea though no PND.  Diabetes: Running 138-210.  Taking NPH 20 units in the morning and 15 units at night.  Also taking Jardiance, glipizide, and metformin.  Occasional polydipsia.  No hypoglycemia.  Dysuria: Patient notes this comes and goes.  It will typically improve on its own.  This time he has had it for about a week.  He notes a little bit of frequency and urgency.  No fevers or hematuria.  Patient reports he injured his right toenail when he got a string stuck around it and put his shoe on.  He notes it bruised the area underneath his toenail.  He notes occasional discomfort from this.  Social History   Tobacco Use  Smoking Status Former Smoker  Smokeless Tobacco Architectural technologist  . Types: Snuff     ROS see history of present illness  Objective  Physical Exam Vitals:   10/25/18 1554  BP: (!) 144/62  Pulse: 93  Resp: 15  Temp: (!) 97.5 F (36.4 C)  SpO2: 96%    BP Readings from Last 3 Encounters:  10/25/18 (!) 144/62  06/19/18 128/62  03/19/18 138/64   Wt Readings from Last 3 Encounters:  10/25/18 251 lb 6.4 oz (114 kg)  06/19/18 248 lb (112.5 kg)  03/19/18 248 lb 6.4 oz (112.7 kg)    Physical Exam  Constitutional: No distress.  Cardiovascular: Normal rate, regular rhythm and normal heart sounds.  Pulmonary/Chest: Effort normal and breath sounds normal.    Abdominal: Soft. Bowel sounds are normal. He exhibits no distension. There is no tenderness. There is no rebound and no guarding.  Musculoskeletal: He exhibits no edema.  Neurological: He is alert.  Skin: Skin is warm and dry. He is not diaphoretic.  Right great toenail with onychomycosis, underlying bruising noted   EKG: Normal sinus rhythm, rate 94, first-degree AV block, incomplete right bundle branch block, no ST or T wave changes  Assessment/Plan: Please see individual problem list.  Essential hypertension Adequately controlled for age.  He will continue his current regimen.  Type 2 diabetes mellitus with diabetic neuropathy, unspecified (HCC) Check A1c.  Continue current regimen.  BPH (benign prostatic hyperplasia) Discussed that he should only take his alfuzosin 1 tablet daily.  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  Could be COPD related as he does report he smoked for a long period of time.  EKG is reassuring.  We will check lab work.  We will check a chest x-ray.  Return precautions and AVS.  Determine next step in management based off of labs and chest x-ray.  Contusion of toenail of right foot Refer to podiatry.  Dysuria Potentially could be recurrent UTI though his symptoms come and go.  We will await urine culture results.  Discussed the need for him  to see urology again though he only wants to see them if he has a UTI.  We will contact him when his urine culture returns.    Orders Placed This Encounter  Procedures  . Urine Culture  . DG Chest 2 View    Standing Status:   Future    Number of Occurrences:   1    Standing Expiration Date:   12/26/2019    Order Specific Question:   Reason for Exam (SYMPTOM  OR DIAGNOSIS REQUIRED)    Answer:   DOE    Order Specific Question:   Preferred imaging location?    Answer:   [facility] Specific Question:   Radiology Contrast Protocol - do NOT remove file  path    Answer:   [email or web address]  . Urine Microscopic Only  . CBC  . Comp Met (CMET)  . HgB A1c  . B Nat Peptide  . Ambulatory referral to Podiatry    Referral Priority:   Routine    Referral Type:   Consultation    Referral Reason:   Specialty Services Required    Requested Specialty:   Podiatry    Number of Visits Requested:   1  . POCT Urinalysis Dipstick  . EKG 12-Lead    No orders of the defined types were placed in this encounter.    [name], MD [place]

## 2018-10-25 NOTE — Assessment & Plan Note (Signed)
Potentially could be recurrent UTI though his symptoms come and go.  We will await urine culture results.  Discussed the need for him to see urology again though he only wants to see them if he has a UTI.  We will contact him when his urine culture returns.

## 2018-10-25 NOTE — Assessment & Plan Note (Signed)
Adequately controlled for age.  He will continue his current regimen. 

## 2018-10-25 NOTE — Assessment & Plan Note (Signed)
Check A1c.  Continue current regimen. 

## 2018-10-25 NOTE — Assessment & Plan Note (Signed)
Refer to podiatry

## 2018-10-25 NOTE — Assessment & Plan Note (Signed)
Discussed that he should only take his alfuzosin 1 tablet daily.

## 2018-10-25 NOTE — Patient Instructions (Signed)
Nice to see you. We will get lab work today and contact you with the results. If you develop chest pain or shortness of breath that is persistent please be evaluated immediately. We will contact you with your urine studies. We will get you to see a podiatrist for your toe.

## 2018-10-26 LAB — COMPREHENSIVE METABOLIC PANEL
AG RATIO: 1.4 (calc) (ref 1.0–2.5)
ALBUMIN MSPROF: 4.1 g/dL (ref 3.6–5.1)
ALT: 18 U/L (ref 9–46)
AST: 23 U/L (ref 10–35)
Alkaline phosphatase (APISO): 60 U/L (ref 40–115)
BUN: 22 mg/dL (ref 7–25)
CO2: 25 mmol/L (ref 20–32)
Calcium: 9.7 mg/dL (ref 8.6–10.3)
Chloride: 102 mmol/L (ref 98–110)
Creat: 0.93 mg/dL (ref 0.70–1.18)
GLUCOSE: 181 mg/dL — AB (ref 65–99)
Globulin: 3 g/dL (calc) (ref 1.9–3.7)
POTASSIUM: 3.9 mmol/L (ref 3.5–5.3)
SODIUM: 140 mmol/L (ref 135–146)
TOTAL PROTEIN: 7.1 g/dL (ref 6.1–8.1)
Total Bilirubin: 0.4 mg/dL (ref 0.2–1.2)

## 2018-10-26 LAB — URINE CULTURE
MICRO NUMBER:: 91378974
Result:: NO GROWTH
SPECIMEN QUALITY:: ADEQUATE

## 2018-10-26 LAB — HEMOGLOBIN A1C
HEMOGLOBIN A1C: 7.1 %{Hb} — AB (ref <5.7)
Mean Plasma Glucose: 157 (calc)
eAG (mmol/L): 8.7 (calc)

## 2018-10-26 LAB — URINALYSIS, MICROSCOPIC ONLY
Hyaline Cast: NONE SEEN /LPF
Squamous Epithelial / HPF: NONE SEEN /HPF
WBC, UA: >=60 /HPF — AB (ref 0–5)

## 2018-10-26 LAB — CBC
HEMATOCRIT: 42.5 % (ref 38.5–50.0)
HEMOGLOBIN: 15.3 g/dL (ref 13.2–17.1)
MCH: 31 pg (ref 27.0–33.0)
MCHC: 36 g/dL (ref 32.0–36.0)
MCV: 86 fL (ref 80.0–100.0)
MPV: 11.6 fL (ref 7.5–12.5)
Platelets: 227 10*3/uL (ref 140–400)
RBC: 4.94 10*6/uL (ref 4.20–5.80)
RDW: 13.7 % (ref 11.0–15.0)
WBC: 9.9 10*3/uL (ref 3.8–10.8)

## 2018-10-26 LAB — BRAIN NATRIURETIC PEPTIDE: Brain Natriuretic Peptide: 46 pg/mL (ref <100)

## 2018-10-29 ENCOUNTER — Telehealth: Payer: Self-pay

## 2018-10-29 DIAGNOSIS — I8393 Asymptomatic varicose veins of bilateral lower extremities: Secondary | ICD-10-CM

## 2018-10-29 MED ORDER — EMPAGLIFLOZIN 10 MG PO TABS: 10.0000 mg | ORAL_TABLET | Freq: Two times a day (BID) | ORAL | 0 refills | Status: DC

## 2018-10-29 NOTE — Telephone Encounter (Signed)
Pt stated that he would like a referral placed to see a vein doctor due to discolored vein and broken vein all down his legs and leg pain.    Plus he willl need [facility] Rx has been sent.

## 2018-10-29 NOTE — Telephone Encounter (Signed)
I am happy to place this referral though please contact the patient and see if these are new symptoms or old symptoms.  Please see if he has swelling in one leg that is greater than the other.  If these are new symptoms or he has swelling in one leg greater than the other he needs to be reevaluated right away.

## 2018-10-29 NOTE — Telephone Encounter (Signed)
Referral placed.

## 2018-10-29 NOTE — Telephone Encounter (Signed)
Called and spoke with patient. Pt stated these are old symptoms. Ankles seem to swell the most Right ankle is worse than the left. No leg swelling just ankles.  Pt stated that the busted vessles just started a year ago and just seems to be getting worse. Pt stated that his lower legs has these awful busted brownish blood vessels all down his lower legs.  Sent to PCP

## 2018-11-18 ENCOUNTER — Ambulatory Visit (INDEPENDENT_AMBULATORY_CARE_PROVIDER_SITE_OTHER): Payer: Medicare Other | Admitting: Podiatry

## 2018-11-18 ENCOUNTER — Encounter: Payer: Self-pay | Admitting: Podiatry

## 2018-11-18 VITALS — BP 159/84 | HR 84

## 2018-11-18 DIAGNOSIS — T148XXA Other injury of unspecified body region, initial encounter: Secondary | ICD-10-CM

## 2018-11-18 DIAGNOSIS — S90111S Contusion of right great toe without damage to nail, sequela: Secondary | ICD-10-CM | POA: Diagnosis not present

## 2018-11-18 NOTE — Progress Notes (Signed)
This patient presents to the office for an evaluation of his right great toenail  He says he injured the great toenail wearing his socks.  He felt the nail was injured during the day but the nail was blackened by evening time.  He was seen by Dr.  Birdie SonsSonnenberg who referred him to this office.  This patient is diabetic with neuropathy.  He presents to the office saying he is experiencing no pain to the right great toe.  He presents for evaluation and treatment.  General Appearance  Alert, conversant and in no acute stress.  Vascular  Dorsalis pedis and posterior tibial  pulses are palpable  bilaterally.  Capillary return is within normal limits  bilaterally. Temperature is within normal limits  bilaterally.  Neurologic  Senn-Weinstein monofilament wire test diminished/absent   bilaterally. Muscle power within normal limits bilaterally.  Nails Thick disfigured discolored nails with subungual debris  from hallux to fifth toes bilaterally. No evidence of bacterial infection or drainage bilaterally.Blackedned dry hematoma noted subungually right hallux.  Orthopedic  No limitations of motion  feet .  No crepitus or effusions noted.  No bony pathology or digital deformities noted.  Skin  normotropic skin with no porokeratosis noted bilaterally.  No signs of infections or ulcers noted.    Hematoma right hallux  Onychomycosis  B/L   IE  Debride hallux toenail right hallux.  RTC prn.   Helane GuntherGregory Lashann Hagg DPM

## 2018-11-22 ENCOUNTER — Ambulatory Visit (INDEPENDENT_AMBULATORY_CARE_PROVIDER_SITE_OTHER): Payer: Medicare Other | Admitting: Vascular Surgery

## 2018-11-22 ENCOUNTER — Encounter (INDEPENDENT_AMBULATORY_CARE_PROVIDER_SITE_OTHER): Payer: Self-pay | Admitting: Vascular Surgery

## 2018-11-22 VITALS — BP 144/79 | HR 93 | Resp 20 | Ht 69.0 in | Wt 250.4 lb

## 2018-11-22 DIAGNOSIS — I10 Essential (primary) hypertension: Secondary | ICD-10-CM

## 2018-11-22 DIAGNOSIS — I83893 Varicose veins of bilateral lower extremities with other complications: Secondary | ICD-10-CM | POA: Diagnosis not present

## 2018-11-22 DIAGNOSIS — Z794 Long term (current) use of insulin: Secondary | ICD-10-CM

## 2018-11-22 DIAGNOSIS — E1140 Type 2 diabetes mellitus with diabetic neuropathy, unspecified: Secondary | ICD-10-CM

## 2018-11-22 DIAGNOSIS — E785 Hyperlipidemia, unspecified: Secondary | ICD-10-CM | POA: Diagnosis not present

## 2018-11-22 NOTE — Assessment & Plan Note (Signed)
blood glucose control important in reducing the progression of atherosclerotic disease. Also, involved in wound healing. On appropriate medications.  

## 2018-11-22 NOTE — Assessment & Plan Note (Signed)
lipid control important in reducing the progression of atherosclerotic disease. Continue statin therapy  

## 2018-11-22 NOTE — Progress Notes (Signed)
Patient ID: Patrick Patel, male   DOB: Oct 10, 1941, 77 y.o.   MRN: [id number]  Chief Complaint  Patient presents with  . New Patient (Initial Visit)    HPI Patrick Patel is a 77 y.o. male.  I am asked to see the patient by Dr. Caryl Bis for evaluation of varicose veins in the lower extremities associated with pain, discoloration, and swelling.  The patient presents with complaints of symptomatic varicosities of the legs. The patient reports a long standing history of varicosities and they have become painful over time. There was no clear inciting event or causative factor that started the symptoms.  The right leg is more severly affected. The patient elevates the legs for relief. The pain is described as cramping and burning in the calves mostly at night. The symptoms are generally most severe in the evening, particularly when they have been on their feet for long periods of time.  Diabetic socks and elevation have been used to try to improve the symptoms with did success. The patient complains of frequent swelling as an associated symptom. The patient has no previous history of deep venous thrombosis or superficial thrombophlebitis to their knowledge.     Past Medical History:  Diagnosis Date  . Diabetes ([place])   . Hyperlipidemia   . Hypertension     No past surgical history on file.  Family History No bleeding disorders, clotting disorders, autoimmune diseases, or aneurysms  Social History Social History   Tobacco Use  . Smoking status: Former Research scientist (life sciences)  . Smokeless tobacco: Current User    Types: Snuff  Substance Use Topics  . Alcohol use: No    Alcohol/week: 0.0 standard drinks  . Drug use: No     Allergies  Allergen Reactions  . Bactrim [Sulfamethoxazole-Trimethoprim] Swelling and Rash  . Pioglitazone Swelling  . Rosiglitazone Other (See Comments)    Ankle swelling    Current Outpatient Medications  Medication Sig Dispense Refill  . ACCU-CHEK SOFTCLIX LANCETS  lancets Use as instructedUSE TO CHECK BLOOD GLUCOSE 3 TIMES DAILY 300 each 2  . acetaminophen (TYLENOL) 500 MG tablet Take 1,000 mg by mouth 2 (two) times daily at 10 AM and 5 PM.     . alfuzosin (UROXATRAL) 10 MG 24 hr tablet Take 1 tablet (10 mg total) by mouth daily with breakfast. 30 tablet 11  . aspirin EC 325 MG tablet Take 650 mg by mouth daily.     . blood glucose meter kit and supplies KIT Dispense Accu-Chek Aviva plus. Check once daily as directed. For ICD 10 code E11.9. 1 each 0  . Calcium-Magnesium-Zinc 167-83-8 MG TABS Take 4 tablets by mouth daily.    . chlorthalidone (HYGROTON) 50 MG tablet Take 1 tablet (50 mg total) by mouth daily. 90 tablet 1  . Cranberry 125 MG TABS Take 4 tablets by mouth daily.    . diphenhydrAMINE (BENADRYL) 25 MG tablet Take 50 mg by mouth at bedtime as needed for sleep.    . empagliflozin (JARDIANCE) 10 MG TABS tablet Take 10 mg by mouth 2 (two) times daily. 180 tablet 0  . glipiZIDE (GLUCOTROL) 10 MG tablet TAKE 2 TABLETS BY MOUTH TWO TIMES DAILY 360 tablet 3  . glucose blood (ACCU-CHEK AVIVA PLUS) test strip USE TO CHECK BLOOD GLUCOSE 3 TIMES DAILY 300 each 3  . insulin NPH Human (HUMULIN N) 100 UNIT/ML injection INJECT 20 UNITS IN THE AM AND 15 UNITS AT BEDTIME 90 mL 0  . Insulin Syringe-Needle U-100 (  BD INSULIN SYRINGE U/F) 31G X 5/16" 0.3 ML MISC USE TWICE DAILY WITH INSULIN 180 each 2  . metFORMIN (GLUCOPHAGE-XR) 500 MG 24 hr tablet TAKE 4 TABLETS BY MOUTH ONCE DAILY WITH BREAKFAST 360 tablet 1  . Multiple Vitamins-Minerals (MULTIVITAMIN WITH MINERALS) tablet Take 2 tablets by mouth daily.    . rosuvastatin (CRESTOR) 20 MG tablet Take 1 tablet (20 mg total) by mouth daily. 90 tablet 3  . Omega-3 Fatty Acids (FISH OIL) 1000 MG CPDR Take 4 capsules by mouth every other day.    . triamcinolone cream (KENALOG) 0.1 % APPLY CREAM EXTERNALLY 2  TIMES DAILY AS NEEDED (Patient not taking: Reported on 11/22/2018) 45 g 0   No current facility-administered  medications for this visit.       REVIEW OF SYSTEMS (Negative unless checked)  Constitutional: []Weight loss  []Fever  []Chills Cardiac: []Chest pain   []Chest pressure   []Palpitations   []Shortness of breath when laying flat   []Shortness of breath at rest   []Shortness of breath with exertion. Vascular:  []Pain in legs with walking   [x]Pain in legs at rest   []Pain in legs when laying flat   []Claudication   []Pain in feet when walking  []Pain in feet at rest  []Pain in feet when laying flat   []History of DVT   []Phlebitis   [x]Swelling in legs   [x]Varicose veins   []Non-healing ulcers Pulmonary:   []Uses home oxygen   []Productive cough   []Hemoptysis   []Wheeze  []COPD   []Asthma Neurologic:  []Dizziness  []Blackouts   []Seizures   []History of stroke   []History of TIA  []Aphasia   []Temporary blindness   []Dysphagia   []Weakness or numbness in arms   []Weakness or numbness in legs Musculoskeletal:  [x]Arthritis   []Joint swelling   []Joint pain   []Low back pain Hematologic:  []Easy bruising  []Easy bleeding   []Hypercoagulable state   []Anemic  []Hepatitis Gastrointestinal:  []Blood in stool   []Vomiting blood  []Gastroesophageal reflux/heartburn   []Abdominal pain Genitourinary:  []Chronic kidney disease   []Difficult urination  []Frequent urination  []Burning with urination   []Hematuria Skin:  []Rashes   []Ulcers   []Wounds Psychological:  []History of anxiety   [] History of major depression.    Physical Exam BP (!) 144/79 (BP Location: Right Arm, Patient Position: Sitting)   Pulse 93   Resp 20   Ht 5' 9" (1.753 m)   Wt 250 lb 6.4 oz (113.6 kg)   BMI 36.98 kg/m  Gen:  WD/WN, NAD.  Appears younger than stated age Head: [place]/AT, No temporalis wasting.  Ear/Nose/Throat: Hearing grossly intact, dentition good Eyes: Sclera non-icteric. Conjunctiva clear Neck: Supple. Trachea midline Pulmonary:  Good air movement, no use of accessory muscles, respirations not labored.    Cardiac: RRR, No JVD Vascular: Varicosities diffuse and measuring up to 2-3 mm in the right lower extremity        Varicosities diffuse and measuring up to 2-3 mm in the left lower extremity Vessel Right Left  Radial Palpable Palpable                          PT  1+ palpable  1+ palpable  DP Palpable Palpable   Gastrointestinal: soft, non-tender/non-distended.  Musculoskeletal: M/S 5/5 throughout.   Moderate stasis dermatitis changes present on the right, mild stasis dermatitis changes present on the left.  1 +  RLE edema.  1 + LLE edema Neurologic: Sensation grossly intact in extremities.  Symmetrical.  Speech is fluent.  Psychiatric: Judgment intact, Mood & affect appropriate for pt's clinical situation. Dermatologic: No open wounds or ulcers.  Stasis dermatitis changes as above    Radiology Dg Chest 2 View  Result Date: 10/27/2018 CLINICAL DATA:  Shortness of breath. EXAM: CHEST - 2 VIEW COMPARISON:  Apr 19, 2016 FINDINGS: The heart size and mediastinal contours are within normal limits. Both lungs are clear. The visualized skeletal structures are unremarkable. IMPRESSION: No active cardiopulmonary disease. Electronically Signed   By: Dorise Bullion III M.D   On: 10/27/2018 22:29    Labs Recent Results (from the past 2160 hour(s))  HM DIABETES EYE EXAM     Status: None   Collection Time: 10/03/18 12:00 AM  Result Value Ref Range   HM Diabetic Eye Exam No Retinopathy No Retinopathy    Comment: [facility] - Benay Pillow MD   POCT Urinalysis Dipstick     Status: Abnormal   Collection Time: 10/25/18  4:04 PM  Result Value Ref Range   Color, UA yellow    Clarity, UA clear    Glucose, UA Negative Negative   Bilirubin, UA neg    Ketones, UA trace    Spec Grav, UA 1.010 1.010 - 1.025   Blood, UA trace-lysed    pH, UA 5.0 5.0 - 8.0   Protein, UA Negative Negative   Urobilinogen, UA 0.2 0.2 or 1.0 E.U./dL   Nitrite, UA neg    Leukocytes, UA Small (1+) (A)  Negative   Appearance     Odor    Urine Microscopic Only     Status: Abnormal   Collection Time: 10/25/18  4:10 PM  Result Value Ref Range   WBC, UA > OR = 60 (A) 0 - 5 /HPF   RBC / HPF 0-2 0 - 2 /HPF   Squamous Epithelial / LPF NONE SEEN < OR = 5 /HPF   Bacteria, UA FEW (A) NONE SEEN /HPF   Hyaline Cast NONE SEEN NONE SEEN /LPF  Urine Culture     Status: None   Collection Time: 10/25/18  4:10 PM  Result Value Ref Range   MICRO NUMBER: [id number]    SPECIMEN QUALITY: ADEQUATE    Sample Source NOT GIVEN    STATUS: FINAL    Result: No Growth   CBC     Status: None   Collection Time: 10/25/18  4:18 PM  Result Value Ref Range   WBC 9.9 3.8 - 10.8 Thousand/uL   RBC 4.94 4.20 - 5.80 Million/uL   Hemoglobin 15.3 13.2 - 17.1 g/dL   HCT 42.5 38.5 - 50.0 %   MCV 86.0 80.0 - 100.0 fL   MCH 31.0 27.0 - 33.0 pg   MCHC 36.0 32.0 - 36.0 g/dL   RDW 13.7 11.0 - 15.0 %   Platelets 227 140 - 400 Thousand/uL   MPV 11.6 7.5 - 12.5 fL  Comp Met (CMET)     Status: Abnormal   Collection Time: 10/25/18  4:18 PM  Result Value Ref Range   Glucose, Bld 181 (H) 65 - 99 mg/dL    Comment: .            Fasting reference interval . For someone without known diabetes, a glucose value >125 mg/dL indicates that they may have diabetes and this should be confirmed with a follow-up test. .    BUN 22 7 -  25 mg/dL   Creat 0.93 0.70 - 1.18 mg/dL    Comment: For patients >54 years of age, the reference limit for Creatinine is approximately 13% higher for people identified as African-American. .    BUN/Creatinine Ratio NOT APPLICABLE 6 - 22 (calc)   Sodium 140 135 - 146 mmol/L   Potassium 3.9 3.5 - 5.3 mmol/L   Chloride 102 98 - 110 mmol/L   CO2 25 20 - 32 mmol/L   Calcium 9.7 8.6 - 10.3 mg/dL   Total Protein 7.1 6.1 - 8.1 g/dL   Albumin 4.1 3.6 - 5.1 g/dL   Globulin 3.0 1.9 - 3.7 g/dL (calc)   AG Ratio 1.4 1.0 - 2.5 (calc)   Total Bilirubin 0.4 0.2 - 1.2 mg/dL   Alkaline phosphatase (APISO) 60 40 -  115 U/L   AST 23 10 - 35 U/L   ALT 18 9 - 46 U/L  HgB A1c     Status: Abnormal   Collection Time: 10/25/18  4:18 PM  Result Value Ref Range   Hgb A1c MFr Bld 7.1 (H) <5.7 % of total Hgb    Comment: For someone without known diabetes, a hemoglobin A1c value of 6.5% or greater indicates that they may have  diabetes and this should be confirmed with a follow-up  test. . For someone with known diabetes, a value <7% indicates  that their diabetes is well controlled and a value  greater than or equal to 7% indicates suboptimal  control. A1c targets should be individualized based on  duration of diabetes, age, comorbid conditions, and  other considerations. . Currently, no consensus exists regarding use of hemoglobin A1c for diagnosis of diabetes for children. .    Mean Plasma Glucose 157 (calc)   eAG (mmol/L) 8.7 (calc)  B Nat Peptide     Status: None   Collection Time: 10/25/18  4:18 PM  Result Value Ref Range   Brain Natriuretic Peptide 46 <100 pg/mL    Comment: . BNP levels increase with age in the general population with the highest values seen in individuals greater than 49 years of age. Reference: J. Am. [place] Ar. Cardiol. 2002; 16:109-604. .     Assessment/Plan:  Essential hypertension blood pressure control important in reducing the progression of atherosclerotic disease. On appropriate oral medications.   Type 2 diabetes mellitus with diabetic neuropathy, unspecified (HCC) blood glucose control important in reducing the progression of atherosclerotic disease. Also, involved in wound healing. On appropriate medications.   Hyperlipidemia lipid control important in reducing the progression of atherosclerotic disease. Continue statin therapy   Varicose veins of bilateral lower extremities with other complications   The patient has symptoms consistent with chronic venous insufficiency. We discussed the natural history and treatment options for venous disease. I  recommended the regular use of 20 - 30 mm Hg compression stockings, and prescribed these today. I recommended leg elevation and anti-inflammatories as needed for pain. I have also recommended a complete venous duplex to assess the venous system for reflux or thrombotic issues. This can be done at the patient's convenience. I will see the patient back after the duplex to assess the response to conservative management, and determine further treatment options.     Leotis Pain 11/22/2018, 3:48 PM   This note was created with Dragon medical transcription system.  Any errors from dictation are unintentional.

## 2018-11-22 NOTE — Assessment & Plan Note (Signed)
blood pressure control important in reducing the progression of atherosclerotic disease. On appropriate oral medications.  

## 2018-11-22 NOTE — Patient Instructions (Signed)
Varicose Veins Varicose veins are veins that have become enlarged and twisted. They are usually seen in the legs but can occur in other parts of the body as well. What are the causes? This condition is the result of valves in the veins not working properly. Valves in the veins help to return blood from the leg to the heart. If these valves are damaged, blood flows backward and backs up into the veins in the leg near the skin. This causes the veins to become larger. What increases the risk? People who are on their feet a lot, who are pregnant, or who are overweight are more likely to develop varicose veins. What are the signs or symptoms?  Bulging, twisted-appearing, bluish veins, most commonly found on the legs.  Leg pain or a feeling of heaviness. These symptoms may be worse at the end of the day.  Leg swelling.  Changes in skin color. How is this diagnosed? A health care provider can usually diagnose varicose veins by examining your legs. Your health care provider may also recommend an ultrasound of your leg veins. How is this treated? Most varicose veins can be treated at home.However, other treatments are available for people who have persistent symptoms or want to improve the cosmetic appearance of the varicose veins. These treatment options include:  Sclerotherapy. A solution is injected into the vein to close it off.  Laser treatment. A laser is used to heat the vein to close it off.  Radiofrequency vein ablation. An electrical current produced by radio waves is used to close off the vein.  Phlebectomy. The vein is surgically removed through small incisions made over the varicose vein.  Vein ligation and stripping. The vein is surgically removed through incisions made over the varicose vein after the vein has been tied (ligated). Follow these instructions at home:   Do not stand or sit in one position for long periods of time. Do not sit with your legs crossed. Rest with your  legs raised during the day.  Wear compression stockings as directed by your health care provider. These stockings help to prevent blood clots and reduce swelling in your legs.  Do not wear other tight, encircling garments around your legs, pelvis, or waist.  Walk as much as possible to increase blood flow.  Raise the foot of your bed at night with 2-inch blocks.  If you get a cut in the skin over the vein and the vein bleeds, lie down with your leg raised and press on it with a clean cloth until the bleeding stops. Then place a bandage (dressing) on the cut. See your health care provider if it continues to bleed. Contact a health care provider if:  The skin around your ankle starts to break down.  You have pain, redness, tenderness, or hard swelling in your leg over a vein.  You are uncomfortable because of leg pain. This information is not intended to replace advice given to you by your health care provider. Make sure you discuss any questions you have with your health care provider. Document Released: 09/06/2005 Document Revised: 05/04/2016 Document Reviewed: 05/30/2016 Elsevier Interactive Patient Education  2017 Elsevier Inc.  

## 2018-11-26 ENCOUNTER — Telehealth: Payer: Self-pay

## 2018-11-26 NOTE — Telephone Encounter (Signed)
PA done   [employer]   Nichols Schroepfer Key: [id number] - PA Case ID: [id number]    Waiting for determination  .

## 2018-12-19 ENCOUNTER — Emergency Department: Payer: Medicare Other

## 2018-12-19 ENCOUNTER — Emergency Department
Admission: EM | Admit: 2018-12-19 | Discharge: 2018-12-19 | Disposition: A | Discharge status: Home / Self Care | Payer: Medicare Other | Attending: Emergency Medicine | Admitting: Emergency Medicine

## 2018-12-19 ENCOUNTER — Other Ambulatory Visit: Payer: Self-pay

## 2018-12-19 DIAGNOSIS — I10 Essential (primary) hypertension: Secondary | ICD-10-CM | POA: Diagnosis not present

## 2018-12-19 DIAGNOSIS — F17228 Nicotine dependence, chewing tobacco, with other nicotine-induced disorders: Secondary | ICD-10-CM | POA: Insufficient documentation

## 2018-12-19 DIAGNOSIS — R338 Other retention of urine: Secondary | ICD-10-CM | POA: Insufficient documentation

## 2018-12-19 DIAGNOSIS — N3289 Other specified disorders of bladder: Secondary | ICD-10-CM | POA: Diagnosis not present

## 2018-12-19 DIAGNOSIS — E1165 Type 2 diabetes mellitus with hyperglycemia: Secondary | ICD-10-CM | POA: Diagnosis not present

## 2018-12-19 DIAGNOSIS — R000 Tachycardia, unspecified: Secondary | ICD-10-CM | POA: Diagnosis not present

## 2018-12-19 DIAGNOSIS — N3000 Acute cystitis without hematuria: Secondary | ICD-10-CM | POA: Diagnosis not present

## 2018-12-19 DIAGNOSIS — R319 Hematuria, unspecified: Secondary | ICD-10-CM | POA: Diagnosis not present

## 2018-12-19 DIAGNOSIS — N3001 Acute cystitis with hematuria: Secondary | ICD-10-CM | POA: Diagnosis not present

## 2018-12-19 DIAGNOSIS — R52 Pain, unspecified: Secondary | ICD-10-CM | POA: Diagnosis not present

## 2018-12-19 DIAGNOSIS — E1140 Type 2 diabetes mellitus with diabetic neuropathy, unspecified: Secondary | ICD-10-CM | POA: Diagnosis not present

## 2018-12-19 DIAGNOSIS — R339 Retention of urine, unspecified: Secondary | ICD-10-CM | POA: Diagnosis not present

## 2018-12-19 DIAGNOSIS — M549 Dorsalgia, unspecified: Secondary | ICD-10-CM | POA: Diagnosis present

## 2018-12-19 LAB — URINALYSIS, ROUTINE W REFLEX MICROSCOPIC
Bilirubin Urine: NEGATIVE
Glucose, UA: >=500 mg/dL — AB
Ketones, ur: NEGATIVE mg/dL
Nitrite: NEGATIVE
PROTEIN: NEGATIVE mg/dL
RBC / HPF: >50 RBC/hpf — ABNORMAL HIGH (ref 0–5)
Specific Gravity, Urine: 1.022 (ref 1.005–1.030)
Squamous Epithelial / HPF: NONE SEEN (ref 0–5)
WBC, UA: >50 WBC/hpf — ABNORMAL HIGH (ref 0–5)
pH: 5 (ref 5.0–8.0)

## 2018-12-19 LAB — COMPREHENSIVE METABOLIC PANEL
ALT: 21 U/L (ref 0–44)
AST: 40 U/L (ref 15–41)
Albumin: 4.2 g/dL (ref 3.5–5.0)
Alkaline Phosphatase: 61 U/L (ref 38–126)
Anion gap: 17 — ABNORMAL HIGH (ref 5–15)
BUN: 27 mg/dL — ABNORMAL HIGH (ref 8–23)
CO2: 20 mmol/L — ABNORMAL LOW (ref 22–32)
CREATININE: 1.15 mg/dL (ref 0.61–1.24)
Calcium: 9.5 mg/dL (ref 8.9–10.3)
Chloride: 101 mmol/L (ref 98–111)
GFR calc non Af Amer: >60 mL/min (ref >60)
Glucose, Bld: 208 mg/dL — ABNORMAL HIGH (ref 70–99)
Potassium: 3.4 mmol/L — ABNORMAL LOW (ref 3.5–5.1)
Sodium: 138 mmol/L (ref 135–145)
Total Bilirubin: 0.9 mg/dL (ref 0.3–1.2)
Total Protein: 7.5 g/dL (ref 6.5–8.1)

## 2018-12-19 LAB — CBC WITH DIFFERENTIAL/PLATELET
Abs Immature Granulocytes: 0.1 10*3/uL — ABNORMAL HIGH (ref 0.00–0.07)
Basophils Absolute: 0 10*3/uL (ref 0.0–0.1)
Basophils Relative: 0 %
Eosinophils Absolute: 0 10*3/uL (ref 0.0–0.5)
Eosinophils Relative: 0 %
HCT: 48 % (ref 39.0–52.0)
Hemoglobin: 16.5 g/dL (ref 13.0–17.0)
Immature Granulocytes: 1 %
Lymphocytes Relative: 5 %
Lymphs Abs: 0.8 10*3/uL (ref 0.7–4.0)
MCH: 30.4 pg (ref 26.0–34.0)
MCHC: 34.4 g/dL (ref 30.0–36.0)
MCV: 88.6 fL (ref 80.0–100.0)
Monocytes Absolute: 0.5 10*3/uL (ref 0.1–1.0)
Monocytes Relative: 3 %
Neutro Abs: 14 10*3/uL — ABNORMAL HIGH (ref 1.7–7.7)
Neutrophils Relative %: 91 %
Platelets: 218 10*3/uL (ref 150–400)
RBC: 5.42 MIL/uL (ref 4.22–5.81)
RDW: 13.7 % (ref 11.5–15.5)
WBC: 15.5 10*3/uL — ABNORMAL HIGH (ref 4.0–10.5)
nRBC: 0 % (ref 0.0–0.2)

## 2018-12-19 LAB — GLUCOSE, CAPILLARY
Glucose-Capillary: 201 mg/dL — ABNORMAL HIGH (ref 70–99)
Glucose-Capillary: 282 mg/dL — ABNORMAL HIGH (ref 70–99)

## 2018-12-19 LAB — LIPASE, BLOOD: Lipase: 43 U/L (ref 11–51)

## 2018-12-19 LAB — TROPONIN I

## 2018-12-19 MED ORDER — CEFTRIAXONE SODIUM 1 G IJ SOLR
1.0000 g | INTRAMUSCULAR | Status: DC
  Administered 2018-12-19: 1 g via INTRAMUSCULAR
  Filled 2018-12-19: qty 10

## 2018-12-19 MED ORDER — MORPHINE SULFATE (PF) 2 MG/ML IV SOLN
2.0000 mg | Freq: Once | INTRAVENOUS | Status: AC
  Administered 2018-12-19: 2 mg via INTRAVENOUS
  Filled 2018-12-19: qty 1

## 2018-12-19 MED ORDER — CIPROFLOXACIN HCL 500 MG PO TABS: 500.0000 mg | ORAL_TABLET | Freq: Two times a day (BID) | ORAL | 0 refills | Status: DC

## 2018-12-19 MED ORDER — MORPHINE SULFATE (PF) 4 MG/ML IV SOLN
4.0000 mg | Freq: Once | INTRAVENOUS | Status: DC
  Filled 2018-12-19: qty 1

## 2018-12-19 MED ORDER — LIDOCAINE HCL (PF) 1 % IJ SOLN
INTRAMUSCULAR | Status: AC
  Administered 2018-12-19: 2.1 mL
  Filled 2018-12-19: qty 5

## 2018-12-19 MED ORDER — ONDANSETRON HCL 4 MG/2ML IJ SOLN
4.0000 mg | INTRAMUSCULAR | Status: AC
  Administered 2018-12-19: 4 mg via INTRAVENOUS
  Filled 2018-12-19: qty 2

## 2018-12-19 MED ORDER — CALCIUM CARBONATE ANTACID 500 MG PO CHEW
2.0000 | CHEWABLE_TABLET | Freq: Once | ORAL | Status: AC
  Administered 2018-12-19: 400 mg via ORAL
  Filled 2018-12-19: qty 2

## 2018-12-19 NOTE — ED Notes (Signed)
Pt taken to lobby in wheelchair, pt moved to chair in lobby and pt verbalized that he can walk from lobby to vehicle that his sister is bringing to the front doors.

## 2018-12-19 NOTE — ED Notes (Signed)
RN instructed to "hurry up" with urine analysis at this time Pt very upset about length of stay at ED

## 2018-12-19 NOTE — ED Provider Notes (Addendum)
[facility] Emergency Department Provider Note       Time seen: ----------------------------------------- 7:08 AM on 12/19/2018 -----------------------------------------   I have reviewed the triage vital signs and the nursing notes.  HISTORY   Chief Complaint Back Pain and Hematuria    HPI Patrick Patel is a 78 y.o. male with a history of diabetes, hyperlipidemia, hypertension who presents to the ED for back pain and hematuria.  EMS states patient was sitting in a chair and starting having mid back pain.  EMS states he went to try and get up and felt like he had to urinate and started seeing blood in his urine.  He reports a history of hypertension and diabetes.  He states he has not had this problem before.  Past Medical History:  Diagnosis Date  . Diabetes (HCC)   . Hyperlipidemia   . Hypertension     Patient Active Problem List   Diagnosis Date Noted  . 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  . DOE (dyspnea on exertion) 10/25/2018  . Contusion of toenail of right foot 10/25/2018  . Decreased pedal pulses 03/19/2018  . Lump in throat 12/17/2017  . Diarrhea 02/20/2017  . Dysuria 02/16/2017  . Rotator cuff impingement syndrome 11/17/2016  . Foreskin problem 11/17/2016  . Essential hypertension 11/17/2016  . CAD (coronary artery disease) 05/04/2016  . Type 2 diabetes mellitus with diabetic neuropathy, unspecified (HCC) 04/13/2016  . Hyperlipidemia 04/13/2016  . Chest pain 03/12/2016  . BPH (benign prostatic hyperplasia) 03/12/2016  . Venous stasis of both lower extremities 03/12/2016    History reviewed. No pertinent surgical history.  Allergies Bactrim [sulfamethoxazole-trimethoprim]; Pioglitazone; and Rosiglitazone  Social History Social History   Tobacco Use  . Smoking status: Former Games developer  . Smokeless tobacco: Current User    Types: Snuff  Substance Use Topics  . Alcohol use: No   Alcohol/week: 0.0 standard drinks  . Drug use: No   Review of Systems Constitutional: Negative for fever. Cardiovascular: Negative for chest pain. Respiratory: Negative for shortness of breath. Gastrointestinal: Negative for abdominal pain, vomiting and diarrhea. Genitourinary: Positive for hematuria Musculoskeletal: Positive for back pain Skin: Negative for rash. Neurological: Negative for headaches, focal weakness or numbness.  All systems negative/normal/unremarkable except as stated in the HPI  ____________________________________________   PHYSICAL EXAM:  VITAL SIGNS: ED Triage Vitals  Enc Vitals Group     BP 12/19/18 0456 (!) 135/58     Pulse Rate 12/19/18 0456 (!) 125     Resp 12/19/18 0456 20     Temp 12/19/18 0456 99 F (37.2 C)     Temp Source 12/19/18 0456 Oral     SpO2 12/19/18 0456 100 %     Weight 12/19/18 0454 246 lb (111.6 kg)     Height 12/19/18 0454 5\' 9"  (1.753 m)     Head Circumference --      Peak Flow --      Pain Score 12/19/18 0453 6     Pain Loc --      Pain Edu? --      Excl. in GC? --    Constitutional: Alert and oriented.  Disheveled appearance ENT      Head: Normocephalic and atraumatic.      Nose: No congestion/rhinnorhea.      Mouth/Throat: Mucous membranes are moist.      Neck: No stridor. Cardiovascular: Rapid rate, regular rhythm. No murmurs, rubs, or gallops. Respiratory: Normal respiratory effort without tachypnea nor  retractions. Breath sounds are clear and equal bilaterally. No wheezes/rales/rhonchi. Gastrointestinal: Soft and nontender. Normal bowel sounds Musculoskeletal: No lower extremity tenderness nor edema. Neurologic:  Normal speech and language. No gross focal neurologic deficits are appreciated.  Skin:  Skin is warm, dry and intact. No rash noted. Psychiatric: Mood and affect are normal.  ____________________________________________  EKG: Interpreted by me.  Junctional tachycardia with a rate of 124 bpm, normal  axis, long QT.  ____________________________________________  ED COURSE:  As part of my medical decision making, I reviewed the following data within the electronic MEDICAL RECORD NUMBER History obtained from family if available, nursing notes, old chart and ekg, as well as notes from prior ED visits. Patient presented for hematuria and back pain, we will assess with labs and imaging as indicated at this time.   Procedures ____________________________________________   LABS (pertinent positives/negatives)  Labs Reviewed  URINALYSIS, ROUTINE W REFLEX MICROSCOPIC - Abnormal; Notable for the following components:      Result Value   Color, Urine YELLOW (*)    APPearance HAZY (*)    Glucose, UA >=500 (*)    Hgb urine dipstick MODERATE (*)    Leukocytes, UA SMALL (*)    RBC / HPF >50 (*)    WBC, UA >50 (*)    Bacteria, UA MANY (*)    All other components within normal limits  CBC WITH DIFFERENTIAL/PLATELET - Abnormal; Notable for the following components:   WBC 15.5 (*)    Neutro Abs 14.0 (*)    Abs Immature Granulocytes 0.10 (*)    All other components within normal limits  COMPREHENSIVE METABOLIC PANEL - Abnormal; Notable for the following components:   Potassium 3.4 (*)    CO2 20 (*)    Glucose, Bld 208 (*)    BUN 27 (*)    Anion gap 17 (*)    All other components within normal limits  GLUCOSE, CAPILLARY - Abnormal; Notable for the following components:   Glucose-Capillary 201 (*)    All other components within normal limits  GLUCOSE, CAPILLARY - Abnormal; Notable for the following components:   Glucose-Capillary 282 (*)    All other components within normal limits  URINE CULTURE  LIPASE, BLOOD  TROPONIN I    RADIOLOGY  CT renal protocol IMPRESSION: 1. No hydronephrosis or urolithiasis. 2. Moderately distended bladder. 3. Enlarged prostate. ____________________________________________   DIFFERENTIAL DIAGNOSIS   UTI, renal colic, bladder cancer, medication side  effect, urinary retention, prostatitis  FINAL ASSESSMENT AND PLAN  Cystitis, acute urinary retention   Plan: The patient had presented for back pain and hematuria. Patient's labs did reveal leukocytosis.  Urine was certainly indicative of urinary tract infection.  Patient's imaging revealed a distended bladder and enlarged prostate.  Patient has findings concerning for urosepsis however I spent a considerable amount of time trying to convince him to receive IV antibiotics to let us completely drain his bladder with a catheter.  Post void residual revealed around 1 L of urine.  He has declined further intervention.  He has been disagreeable throughout his stay insisting he does not need any of the interventions that we are pressing him for.  Have described that this could potentially be life-threatening and he is leaving AGAINST MEDICAL ADVICE despite my best efforts to try to get him to get further care here.  He did agree to an IM shot of Rocephin prior to departure.   Ulice DashJohnathan E Williams, MD    Note: This note was generated in  part or whole with voice recognition software. Voice recognition is usually quite accurate but there are transcription errors that can and very often do occur. I apologize for any typographical errors that were not detected and corrected.     Emily FilbertWilliams, Jonathan E, MD 12/19/18 [id number]    Emily FilbertWilliams, Jonathan E, MD 12/19/18 [phone]

## 2018-12-19 NOTE — ED Notes (Signed)
Patient transported to CT 

## 2018-12-19 NOTE — ED Notes (Signed)
Post Void Residual: 948 mL

## 2018-12-19 NOTE — ED Notes (Addendum)
This RN to bedside at this time, pt removed IV. Pt refusing catheter. This RN and Alvino Chapel, RN attempted to explain risks of not having foley. Pt states he was put on a 60month abx by urologist 2 yrs ago and has not had a urinary infection since then. This RN attempted to explain that MD recommended foley to drain bladder and that if he had a UTI, he would need a foley. MD notified.

## 2018-12-19 NOTE — Discharge Instructions (Addendum)
Your care is not complete without foley catheter placement and IV antibiotics. Please discuss this as soon as possible with your doctor

## 2018-12-19 NOTE — ED Triage Notes (Signed)
Pt arrived via [facility] from home with c/o back pain and hematuria. EMS states pt was sitting in chair and started having mid back pain. EMS states that pt went to try and get up and then felt like he had to pee and started peeing blood. EMS states that pt has hx of HTN and diabetes.

## 2018-12-19 NOTE — ED Notes (Signed)
Pt to the desk, sat down in chair in front of nurses station, states he "just got tired". Pt requesting apple juice and water due to his mouth being dry, pt stating he wanted to leave because "I've been here for 4 hrs and they haven't done anything". This RN explained he would need to go back to his room. Pt states understanding at this time, pt back to room, given water and apple juice per his request.

## 2018-12-19 NOTE — ED Notes (Signed)
CBG checked per patient request, per MD patient okay to take home medications. While this RN at bedside pt states the real reason he called EMS was due to "[him] violently shaking for over an hour and it was involuntary". Pt states that EMS "got an a urine kick and nobody has investigated it". This RN notified MD of new complaint. Pt with no involuntary movements noted at this time, ambulatory without difficulty. Will continue to monitor for further patient needs.

## 2018-12-19 NOTE — ED Notes (Signed)
Pt educated that he is leaving against medical advice- pt insistent that he has complied because he took a shot of antibiotics. Pt educated again that he needs to have a catheter to remove urine and again pt denies need.

## 2018-12-20 LAB — URINE CULTURE
Culture: NO GROWTH
SPECIAL REQUESTS: NORMAL

## 2018-12-23 ENCOUNTER — Encounter (INDEPENDENT_AMBULATORY_CARE_PROVIDER_SITE_OTHER): Payer: Self-pay | Admitting: Nurse Practitioner

## 2018-12-23 ENCOUNTER — Ambulatory Visit (INDEPENDENT_AMBULATORY_CARE_PROVIDER_SITE_OTHER): Payer: Medicare Other | Admitting: Nurse Practitioner

## 2018-12-23 ENCOUNTER — Ambulatory Visit (INDEPENDENT_AMBULATORY_CARE_PROVIDER_SITE_OTHER): Payer: Medicare Other

## 2018-12-23 VITALS — BP 136/77 | HR 99 | Resp 14 | Ht 68.0 in | Wt 246.8 lb

## 2018-12-23 DIAGNOSIS — I10 Essential (primary) hypertension: Secondary | ICD-10-CM

## 2018-12-23 DIAGNOSIS — E785 Hyperlipidemia, unspecified: Secondary | ICD-10-CM | POA: Diagnosis not present

## 2018-12-23 DIAGNOSIS — I83893 Varicose veins of bilateral lower extremities with other complications: Secondary | ICD-10-CM | POA: Diagnosis not present

## 2018-12-24 ENCOUNTER — Encounter (INDEPENDENT_AMBULATORY_CARE_PROVIDER_SITE_OTHER): Payer: Self-pay | Admitting: Nurse Practitioner

## 2018-12-24 NOTE — Progress Notes (Signed)
Subjective:    Patient ID: Patrick Patel, male    DOB: Apr 19, 1941, 78 y.o.   MRN: [id number] Chief Complaint  Patient presents with  . Follow-up    HPI  Patrick Patel is a 78 y.o. male that returns for noninvasive studies after an initial visit with our office on 11/22/2018.  Since that time the patient has been wearing medical grade 1 compression stockings on a daily basis.The patient is seen for evaluation of symptomatic varicose veins. The patient relates burning and stinging which worsened steadily throughout the course of the day, particularly with standing. The patient also notes an aching and throbbing pain over the varicosities, particularly with prolonged dependent positions. The symptoms are significantly improved with elevation.  The patient also notes that during hot weather the symptoms are greatly intensified. The patient states the pain from the varicose veins interferes with work, daily exercise, shopping and household maintenance. At this point, the symptoms are persistent and severe enough that they're having a negative impact on lifestyle and are interfering with daily activities.  There is no history of DVT, PE or superficial thrombophlebitis. There is no history of ulceration or hemorrhage. The patient denies a significant family history of varicose veins. There is no history of prior surgical intervention or sclerotherapy.  The patient underwent bilateral reflux studies today which revealed reflux in the great saphenous vein at the knee level on the right lower extremity with reflux of the left great saphenous vein from the level of the proximal thigh to proximal calf area.  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  There is no evidence of DVT or superficial venous thrombosis bilaterally.  Past Medical History:  Diagnosis Date  . Diabetes ([place])   . Hyperlipidemia   . Hypertension     History reviewed. No pertinent surgical history.  Social History    Socioeconomic History  . Marital status: Single    Spouse name: Not on file  . Number of children: Not on file  . Years of education: Not on file  . Highest education level: Not on file  Occupational History  . Not on file  Social Needs  . Financial resource strain: Not on file  . Food insecurity:    Worry: Not on file    Inability: Not on file  . Transportation needs:    Medical: Not on file    Non-medical: Not on file  Tobacco Use  . Smoking status: Former Research scientist (life sciences)  . Smokeless tobacco: Current User    Types: Snuff  Substance and Sexual Activity  . Alcohol use: No    Alcohol/week: 0.0 standard drinks  . Drug use: No  . Sexual activity: Not on file  Lifestyle  . Physical activity:    Days per week: Not on file    Minutes per session: Not on file  . Stress: Not on file  Relationships  . Social connections:    Talks on phone: Not on file    Gets together: Not on file    Attends religious service: Not on file    Active member of club or organization: Not on file    Attends meetings of clubs or organizations: Not on file    Relationship status: Not on file  . Intimate partner violence:    Fear of current or ex partner: Not on file    Emotionally abused: Not on file    Physically abused: Not on file    Forced sexual activity: Not on file  Other Topics Concern  . Not on file  Social History Narrative  . Not on file    History reviewed. No pertinent family history.  Allergies  Allergen Reactions  . Bactrim [Sulfamethoxazole-Trimethoprim] Swelling and Rash  . Pioglitazone Swelling  . Rosiglitazone Other (See Comments)    Ankle swelling     Review of Systems   Review of Systems: Negative Unless Checked Constitutional: [] Weight loss  [] Fever  [] Chills Cardiac: [] Chest pain   []  Atrial Fibrillation  [] Palpitations   [] Shortness of breath when laying flat   [] Shortness of breath with exertion. [] Shortness of breath at rest Vascular:  [] Pain in legs with walking    [] Pain in legs with standing [] Pain in legs when laying flat   [] Claudication    [] Pain in feet when laying flat    [] History of DVT   [] Phlebitis   [x] Swelling in legs   [x] Varicose veins   [] Non-healing ulcers Pulmonary:   [] Uses home oxygen   [] Productive cough   [] Hemoptysis   [] Wheeze  [] COPD   [] Asthma Neurologic:  [] Dizziness   [] Seizures  [] Blackouts [] History of stroke   [] History of TIA  [] Aphasia   [] Temporary Blindness   [] Weakness or numbness in arm   [] Weakness or numbness in leg Musculoskeletal:   [] Joint swelling   [] Joint pain   [] Low back pain  []  History of Knee Replacement [] Arthritis [] back Surgeries  []  Spinal Stenosis    Hematologic:  [] Easy bruising  [] Easy bleeding   [] Hypercoagulable state   [] Anemic Gastrointestinal:  [x] Diarrhea   [] Vomiting  [] Gastroesophageal reflux/heartburn   [] Difficulty swallowing. [] Abdominal pain Genitourinary:  [] Chronic kidney disease   [] Difficult urination  [] Anuric   [] Blood in urine [] Frequent urination  [] Burning with urination   [] Hematuria Skin:  [] Rashes   [] Ulcers [] Wounds Psychological:  [] History of anxiety   []  History of major depression  []  Memory Difficulties     Objective:   Physical Exam  BP 136/77 (BP Location: Right Arm, Patient Position: Sitting)   Pulse 99   Resp 14   Ht 5' 8"  (1.727 m)   Wt 246 lb 12.8 oz (111.9 kg)   BMI 37.53 kg/m   Gen: WD/WN, NAD Head: [place]/AT, No temporalis wasting.  Ear/Nose/Throat: Hearing grossly intact, nares w/o erythema or drainage Eyes: PER, EOMI, sclera nonicteric.  Neck: Supple, no masses.  No JVD.  Pulmonary:  Good air movement, no use of accessory muscles.  Cardiac: RRR Vascular:  Vessel Right Left  Radial Palpable Palpable  Dorsalis Pedis Palpable Palpable  Posterior Tibial Palpable Palpable   Gastrointestinal: soft, non-distended. No guarding/no peritoneal signs.  Musculoskeletal: M/S 5/5 throughout.  No deformity or atrophy.  Neurologic: Pain and light touch intact in  extremities.  Symmetrical.  Speech is fluent. Motor exam as listed above. Psychiatric: Judgment intact, Mood & affect appropriate for pt's clinical situation. Dermatologic: Bilateral stasis dermatitis no Ulcers Noted.  No changes consistent with cellulitis. Lymph : No Cervical lymphadenopathy, no lichenification or skin changes of chronic lymphedema.      Assessment & Plan:   1. Varicose veins of bilateral lower extremities with other complications The patient underwent bilateral reflux studies today which revealed reflux in the great saphenous vein at the knee level on the right lower extremity with reflux of the left great saphenous vein from the level of the proximal thigh to proximal calf area.  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  There is no evidence of DVT or superficial venous thrombosis bilaterally.   Recommend:  The patient has large symptomatic varicose veins that are painful and associated with swelling.  I have had a long discussion with the patient regarding  varicose veins and why they cause symptoms.  Patient will begin wearing graduated compression stockings class 1 on a daily basis, beginning first thing in the morning and removing them in the evening. The patient is instructed specifically not to sleep in the stockings.    The patient  will also begin using over-the-counter analgesics such as Motrin 600 mg po TID to help control the symptoms.    In addition, behavioral modification including elevation during the day will be initiated.    Patient has already underwent bilateral venous reflux studies, therefore we will see how is progressing with conservative therapy and we will bring him back in 2 months.  2. Hyperlipidemia, unspecified hyperlipidemia type Continue statin as ordered and reviewed, no changes at this time   3. Essential hypertension Continue antihypertensive medications as already ordered, these medications have been reviewed and there are no changes  at this time.    Current Outpatient Medications on File Prior to Visit  Medication Sig Dispense Refill  . ACCU-CHEK SOFTCLIX LANCETS lancets Use as instructedUSE TO CHECK BLOOD GLUCOSE 3 TIMES DAILY 300 each 2  . acetaminophen (TYLENOL) 500 MG tablet Take 1,000 mg by mouth 2 (two) times daily at 10 AM and 5 PM.     . alfuzosin (UROXATRAL) 10 MG 24 hr tablet Take 1 tablet (10 mg total) by mouth daily with breakfast. 30 tablet 11  . aspirin EC 325 MG tablet Take 650 mg by mouth daily.     . blood glucose meter kit and supplies KIT Dispense Accu-Chek Aviva plus. Check once daily as directed. For ICD 10 code E11.9. 1 each 0  . Calcium-Magnesium-Zinc 167-83-8 MG TABS Take 4 tablets by mouth daily.    . chlorthalidone (HYGROTON) 50 MG tablet Take 1 tablet (50 mg total) by mouth daily. 90 tablet 1  . Cranberry 125 MG TABS Take 4 tablets by mouth daily.    . diphenhydrAMINE (BENADRYL) 25 MG tablet Take 50 mg by mouth at bedtime as needed for sleep.    . empagliflozin (JARDIANCE) 10 MG TABS tablet Take 10 mg by mouth 2 (two) times daily. 180 tablet 0  . glipiZIDE (GLUCOTROL) 10 MG tablet TAKE 2 TABLETS BY MOUTH TWO TIMES DAILY 360 tablet 3  . glucose blood (ACCU-CHEK AVIVA PLUS) test strip USE TO CHECK BLOOD GLUCOSE 3 TIMES DAILY 300 each 3  . insulin NPH Human (HUMULIN N) 100 UNIT/ML injection INJECT 20 UNITS IN THE AM AND 15 UNITS AT BEDTIME 90 mL 0  . Insulin Syringe-Needle U-100 (BD INSULIN SYRINGE U/F) 31G X 5/16" 0.3 ML MISC USE TWICE DAILY WITH INSULIN 180 each 2  . metFORMIN (GLUCOPHAGE-XR) 500 MG 24 hr tablet TAKE 4 TABLETS BY MOUTH ONCE DAILY WITH BREAKFAST 360 tablet 1  . Multiple Vitamins-Minerals (MULTIVITAMIN WITH MINERALS) tablet Take 2 tablets by mouth daily.    . Omega-3 Fatty Acids (FISH OIL) 1000 MG CPDR Take 4 capsules by mouth every other day.    . rosuvastatin (CRESTOR) 20 MG tablet Take 1 tablet (20 mg total) by mouth daily. 90 tablet 3  . triamcinolone cream (KENALOG) 0.1 %  APPLY CREAM EXTERNALLY 2  TIMES DAILY AS NEEDED 45 g 0   No current facility-administered medications on file prior to visit.     There are no Patient Instructions on file for this visit. No  follow-ups on file.   Kris Hartmann, NP  This note was completed with Sales executive.  Any errors are purely unintentional.

## 2019-01-16 DIAGNOSIS — I8310 Varicose veins of unspecified lower extremity with inflammation: Secondary | ICD-10-CM | POA: Diagnosis not present

## 2019-01-23 DIAGNOSIS — E119 Type 2 diabetes mellitus without complications: Secondary | ICD-10-CM | POA: Diagnosis not present

## 2019-01-23 DIAGNOSIS — Z1389 Encounter for screening for other disorder: Secondary | ICD-10-CM | POA: Diagnosis not present

## 2019-01-23 DIAGNOSIS — I10 Essential (primary) hypertension: Secondary | ICD-10-CM | POA: Diagnosis not present

## 2019-01-23 DIAGNOSIS — Z0000 Encounter for general adult medical examination without abnormal findings: Secondary | ICD-10-CM | POA: Diagnosis not present

## 2019-01-29 ENCOUNTER — Other Ambulatory Visit: Payer: Self-pay | Admitting: Family Medicine

## 2019-01-29 MED ORDER — CHLORTHALIDONE 50 MG PO TABS: 50.0000 mg | ORAL_TABLET | Freq: Every day | ORAL | 0 refills | Status: DC

## 2019-01-29 NOTE — Telephone Encounter (Signed)
Copied from CRM [id number]. Topic: Quick Communication - Rx Refill/Question >> Jan 29, 2019 11:11 AM Tamela Oddi wrote: Medication: chlorthalidone (HYGROTON) 50 MG tablet  Patient called to request a refill for the above medication  Preferred Pharmacy (with phone number or street name): [facility] [address], [place] - [address] [phone] (Phone) [phone] (Fax)

## 2019-02-04 ENCOUNTER — Encounter: Payer: Self-pay | Admitting: Family Medicine

## 2019-02-04 ENCOUNTER — Ambulatory Visit (INDEPENDENT_AMBULATORY_CARE_PROVIDER_SITE_OTHER): Payer: Medicare Other | Admitting: Family Medicine

## 2019-02-04 VITALS — BP 130/65 | HR 63 | Temp 97.8°F | Wt 245.0 lb

## 2019-02-04 DIAGNOSIS — E785 Hyperlipidemia, unspecified: Secondary | ICD-10-CM

## 2019-02-04 DIAGNOSIS — E1140 Type 2 diabetes mellitus with diabetic neuropathy, unspecified: Secondary | ICD-10-CM

## 2019-02-04 DIAGNOSIS — L819 Disorder of pigmentation, unspecified: Secondary | ICD-10-CM | POA: Diagnosis not present

## 2019-02-04 DIAGNOSIS — D72829 Elevated white blood cell count, unspecified: Secondary | ICD-10-CM

## 2019-02-04 DIAGNOSIS — R079 Chest pain, unspecified: Secondary | ICD-10-CM

## 2019-02-04 DIAGNOSIS — Z794 Long term (current) use of insulin: Secondary | ICD-10-CM | POA: Diagnosis not present

## 2019-02-04 DIAGNOSIS — R300 Dysuria: Secondary | ICD-10-CM

## 2019-02-04 DIAGNOSIS — M7540 Impingement syndrome of unspecified shoulder: Secondary | ICD-10-CM

## 2019-02-04 DIAGNOSIS — I10 Essential (primary) hypertension: Secondary | ICD-10-CM

## 2019-02-04 DIAGNOSIS — R319 Hematuria, unspecified: Secondary | ICD-10-CM | POA: Diagnosis not present

## 2019-02-04 LAB — POCT URINALYSIS DIPSTICK
Bilirubin, UA: NEGATIVE
Glucose, UA: POSITIVE — AB
Ketones, UA: NEGATIVE
Leukocytes, UA: NEGATIVE
Nitrite, UA: NEGATIVE
Protein, UA: NEGATIVE
Spec Grav, UA: 1.02 (ref 1.010–1.025)
Urobilinogen, UA: 0.2 E.U./dL
pH, UA: 5.5 (ref 5.0–8.0)

## 2019-02-04 NOTE — Progress Notes (Signed)
Marikay Alar, MD Phone: [phone]  Patrick Patel is a 78 y.o. male who presents today for follow-up.  CC: Dysuria, diabetes, hyperlipidemia, muscle strain  Dysuria: Patient notes issues with this last week.  He had hematuria with that as well.  Some frequency last week though this has improved.  No fevers.  No abdominal pain.  He reports an episode in January where he had severe shaking and was taken to the emergency department and underwent evaluation though declined staying for IV antibiotics.  He had a negative urine culture.  CT scan revealed an enlarged prostate and over distended bladder.  He had a 1 L postvoid residual.  He has been hesitant to see urology in the past.  He has not had any recurrence of the shaking episodes.  He has no current dysuria or hematuria.  Diabetes: Typically running between 148 and 265.  Taking Jardiance, glipizide, metformin, and NPH 20 in the morning and 15 at night.  Some polydipsia at night.  No polyuria.  No hypoglycemia.  Hyperlipidemia: Patient continues on Crestor.  He has chronic mild chest pain that is improved per his report.  He has declined further evaluation for this.  He does note chronic myalgias.  No right upper quadrant pain.  Patient reports he feels as though he pulled a muscle in his left arm.  He did this when he was lifting a trash bag and he suddenly developed discomfort.  Notes it hurts when he abducts the arm.  Notes this is slowly improving.  He additionally notes he may have pulled a muscle in his left buttock.  He notes no significant injury.  It has not been improving.  On physical exam there was a lesion noted on his right middle toe tibial aspect distal phalanx.  Patient does not have good vision and is unsure how long that has been there.  Social History   Tobacco Use  Smoking Status Former Smoker  Smokeless Tobacco Biochemist, clinical  . Types: Snuff     ROS see history of present illness  Objective  Physical  Exam Vitals:   02/04/19 1545  BP: 130/65  Pulse: 63  Temp: 97.8 F (36.6 C)  SpO2: 97%    BP Readings from Last 3 Encounters:  02/04/19 130/65  12/23/18 136/77  12/19/18 117/68   Wt Readings from Last 3 Encounters:  02/04/19 245 lb (111.1 kg)  12/23/18 246 lb 12.8 oz (111.9 kg)  12/19/18 246 lb (111.6 kg)    Physical Exam Constitutional:      General: He is not in acute distress.    Appearance: He is not diaphoretic.  Cardiovascular:     Rate and Rhythm: Normal rate and regular rhythm.     Heart sounds: Normal heart sounds.  Pulmonary:     Effort: Pulmonary effort is normal.     Breath sounds: Normal breath sounds.  Abdominal:     General: Bowel sounds are normal. There is no distension.     Tenderness: There is no abdominal tenderness.  Musculoskeletal:     Comments: Left shoulder with no significant tenderness, there is discomfort on active and passive abduction and external rotation, slight decreased range of motion external rotation of left shoulder, positive empty can on the left, right shoulder with no tenderness, there is some discomfort on active and passive abduction and external rotation creased range of motion on external rotation of the right shoulder, no midline spine tenderness, no midline spine step-off, slight lumbar muscular back tenderness  on the left  Skin:    General: Skin is warm and dry.     Comments: Small black 2 to 3 mm spot on the tibial aspect distal phalanx right middle toe, uniform color, regular borders  Neurological:     Mental Status: He is alert.      Assessment/Plan: Please see individual problem list.  Essential hypertension At goal.  Continue current medication.  Type 2 diabetes mellitus with diabetic neuropathy, unspecified (HCC) Check A1c.  Continue current regimen.  Consider discontinuing Jardiance if patient has a UTI on culture.  Refer to podiatry for diabetic shoe exam.  Rotator cuff impingement syndrome I suspect the  patient's left shoulder issue is related to rotator cuff impingement or strain.  He has an appointment with orthopedics already and he will keep this for evaluation of his shoulder and buttocks/low back.  Dysuria Chronic intermittent issue.  With recent hematuria I discussed the need for him to see urology.  Referral was placed.  We will send urine for culture.  Chest pain Continues to have intermittent issues with this though reports that it has improved to some degree.  He continues to decline further evaluation.  Hyperlipidemia Patient will hold his Crestor to see if his muscle aches improved.  He will contact us in 1 week to let us know how he is doing.  Hematuria Refer to urology.  Hyperpigmented skin lesion Undetermined cause.  This could be a small bruise or collection of blood though could represent a as cause.  I discussed referral to dermatology place this.  I discussed this with our referral coordinator and she will send it urgently.    Orders Placed This Encounter  Procedures  . Urine Culture  . HgB A1c  . Basic Metabolic Panel (BMET)  . CBC  . Ambulatory referral to Podiatry    Referral Priority:   Routine    Referral Type:   Consultation    Referral Reason:   Specialty Services Required    Requested Specialty:   Podiatry    Number of Visits Requested:   1  . Ambulatory referral to Urology    Referral Priority:   Routine    Referral Type:   Consultation    Referral Reason:   Specialty Services Required    Requested Specialty:   Urology    Number of Visits Requested:   1  . Ambulatory referral to Dermatology    Referral Priority:   Routine    Referral Type:   Consultation    Referral Reason:   Specialty Services Required    Requested Specialty:   Dermatology    Number of Visits Requested:   1  . POCT Urinalysis Dipstick    No orders of the defined types were placed in this encounter.    Marikay Alar, MD [facility]

## 2019-02-04 NOTE — Patient Instructions (Addendum)
Nice to see you. Please keep your appointment with the orthopedist. We will send her urine for culture.  We will refer you to urology. We will check lab work today. We will refer you to podiatry for diabetic shoe exam. Please discontinue your Crestor for 1 to 2 weeks and see if your muscle aches improve.  Please let us know at that time if you have had improved symptoms or not.

## 2019-02-05 LAB — BASIC METABOLIC PANEL
BUN: 23 mg/dL (ref 6–23)
CO2: 21 mEq/L (ref 19–32)
Calcium: 9.2 mg/dL (ref 8.4–10.5)
Chloride: 102 mEq/L (ref 96–112)
Creatinine, Ser: 0.88 mg/dL (ref 0.40–1.50)
GFR: 84.09 mL/min (ref >60.00)
Glucose, Bld: 95 mg/dL (ref 70–99)
Potassium: 3.6 mEq/L (ref 3.5–5.1)
Sodium: 141 mEq/L (ref 135–145)

## 2019-02-05 LAB — URINE CULTURE
MICRO NUMBER:: 239558
Result:: NO GROWTH
SPECIMEN QUALITY:: ADEQUATE

## 2019-02-05 LAB — HEMOGLOBIN A1C: Hgb A1c MFr Bld: 7.3 % — ABNORMAL HIGH (ref 4.6–6.5)

## 2019-02-05 LAB — CBC
HCT: 43.8 % (ref 39.0–52.0)
Hemoglobin: 15.3 g/dL (ref 13.0–17.0)
MCHC: 34.9 g/dL (ref 30.0–36.0)
MCV: 88.7 fl (ref 78.0–100.0)
Platelets: 231 10*3/uL (ref 150.0–400.0)
RBC: 4.94 Mil/uL (ref 4.22–5.81)
RDW: 14.9 % (ref 11.5–15.5)
WBC: 10.1 10*3/uL (ref 4.0–10.5)

## 2019-02-06 DIAGNOSIS — L819 Disorder of pigmentation, unspecified: Secondary | ICD-10-CM | POA: Insufficient documentation

## 2019-02-06 NOTE — Assessment & Plan Note (Addendum)
I suspect the patient's left shoulder issue is related to rotator cuff impingement or strain.  He has an appointment with orthopedics already and he will keep this for evaluation of his shoulder and buttocks/low back.

## 2019-02-06 NOTE — Assessment & Plan Note (Signed)
Continues to have intermittent issues with this though reports that it has improved to some degree.  He continues to decline further evaluation.

## 2019-02-06 NOTE — Assessment & Plan Note (Signed)
Patient will hold his Crestor to see if his muscle aches improved.  He will contact us in 1 week to let us know how he is doing.

## 2019-02-06 NOTE — Assessment & Plan Note (Signed)
Undetermined cause.  This could be a small bruise or collection of blood though could represent a as cause.  I discussed referral to dermatology place this.  I discussed this with our referral coordinator and she will send it urgently.

## 2019-02-06 NOTE — Assessment & Plan Note (Addendum)
Check A1c.  Continue current regimen.  Consider discontinuing Jardiance if patient has a UTI on culture.  Refer to podiatry for diabetic shoe exam.

## 2019-02-06 NOTE — Assessment & Plan Note (Signed)
Chronic intermittent issue.  With recent hematuria I discussed the need for him to see urology.  Referral was placed.  We will send urine for culture.

## 2019-02-06 NOTE — Assessment & Plan Note (Signed)
Refer to urology.  ?

## 2019-02-06 NOTE — Assessment & Plan Note (Signed)
At goal. Continue current medication. 

## 2019-02-09 DIAGNOSIS — N390 Urinary tract infection, site not specified: Secondary | ICD-10-CM | POA: Diagnosis not present

## 2019-02-09 DIAGNOSIS — R300 Dysuria: Secondary | ICD-10-CM | POA: Diagnosis not present

## 2019-02-10 ENCOUNTER — Ambulatory Visit (INDEPENDENT_AMBULATORY_CARE_PROVIDER_SITE_OTHER): Payer: Medicare Other | Admitting: Podiatry

## 2019-02-10 ENCOUNTER — Encounter: Payer: Self-pay | Admitting: Podiatry

## 2019-02-10 DIAGNOSIS — M205X1 Other deformities of toe(s) (acquired), right foot: Secondary | ICD-10-CM | POA: Diagnosis not present

## 2019-02-10 DIAGNOSIS — M205X2 Other deformities of toe(s) (acquired), left foot: Secondary | ICD-10-CM

## 2019-02-10 DIAGNOSIS — E1142 Type 2 diabetes mellitus with diabetic polyneuropathy: Secondary | ICD-10-CM

## 2019-02-10 NOTE — Progress Notes (Signed)
This patient presents to the office for diabetic exam and requests diabetic shoes.  Patient says he has blackened areas on his third toe right foot.  He presents for evaluation and treatment.  General Appearance  Alert, conversant and in no acute stress.  Vascular  Dorsalis pedis and posterior tibial  pulses are palpable  bilaterally.  Capillary return is within normal limits  bilaterally. Temperature is within normal limits  bilaterally.  Neurologic  Senn-Weinstein monofilament wire test diminished   bilaterally. Muscle power within normal limits bilaterally.  Nails Thick disfigured discolored nails with subungual debris  from hallux to fifth toes bilaterally. No evidence of bacterial infection or drainage bilaterally.  Orthopedic  No limitations of motion  feet .  No crepitus or effusions noted.  No bony pathology or digital deformities noted.  Hallux limitus 1st MPJ  B/L.  Skin  normotropic skin with no porokeratosis noted bilaterally.  No signs of infections or ulcers noted.    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  Diabetic foot exam reveals normal vascular findings with diminished LOPS.  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   Helane Gunther DPM

## 2019-02-11 ENCOUNTER — Telehealth: Payer: Self-pay

## 2019-02-11 NOTE — Telephone Encounter (Signed)
Urology referral    Referral was place but pt was set for an appt with a doctor he did not care for pt would much rather see [facility]. Message sent to [facility].

## 2019-02-12 DIAGNOSIS — M25512 Pain in left shoulder: Secondary | ICD-10-CM | POA: Diagnosis not present

## 2019-02-12 DIAGNOSIS — M25561 Pain in right knee: Secondary | ICD-10-CM | POA: Diagnosis not present

## 2019-02-12 NOTE — Telephone Encounter (Signed)
Sent to [facility] as an [place]

## 2019-02-12 NOTE — Telephone Encounter (Signed)
[name] is no longer in our area. 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 I will let pt know this as well.

## 2019-02-24 ENCOUNTER — Other Ambulatory Visit: Payer: Self-pay | Admitting: Family Medicine

## 2019-02-24 NOTE — Telephone Encounter (Signed)
Copied from CRM [id number]. Topic: Quick Communication - Rx Refill/Question >> Feb 24, 2019  3:20 PM Fanny Bien wrote: Medication: metFORMIN (GLUCOPHAGE-XR) 500 MG 24 hr tablet [[id number]]  Has the patient contacted their pharmacy? no Preferred Pharmacy (with phone number or [facility] [address] [place] - [address] [phone] (Phone) [phone] (Fax)   Agent: Please be advised that RX refills may take up to 3 business days. We ask that you follow-up with your pharmacy.

## 2019-02-25 ENCOUNTER — Other Ambulatory Visit: Payer: Self-pay | Admitting: Family Medicine

## 2019-02-25 ENCOUNTER — Telehealth: Payer: Self-pay | Admitting: Family Medicine

## 2019-02-25 MED ORDER — METFORMIN HCL ER 500 MG PO TB24: ORAL_TABLET | ORAL | 1 refills | Status: DC

## 2019-02-25 NOTE — Telephone Encounter (Signed)
Copied from CRM [id number]. Topic: Quick Communication - Rx Refill/Question >> Feb 25, 2019  1:32 PM Jolayne Haines L wrote: Medication: empagliflozin (JARDIANCE) 10 MG TABS tablet  Has the patient contacted their pharmacy? Yes-no answer (Agent: If no, request that the patient contact the pharmacy for the refill.) (Agent: If yes, when and what did the pharmacy advise?)  Preferred Pharmacy (with phone number or street name[facility] [address], [place] - [address] [place] [address] Phone: [phone] Fax: [phone]    Agent: Please be advised that RX refills may take up to 3 business days. We ask that you follow-up with your pharmacy.

## 2019-02-26 ENCOUNTER — Other Ambulatory Visit: Payer: Medicare Other | Admitting: Orthotics

## 2019-02-26 MED ORDER — EMPAGLIFLOZIN 10 MG PO TABS: 10.0000 mg | ORAL_TABLET | Freq: Two times a day (BID) | ORAL | 0 refills | Status: DC

## 2019-02-26 NOTE — Telephone Encounter (Signed)
Rx has been sent  

## 2019-02-26 NOTE — Telephone Encounter (Signed)
Rx was sent and called pt and left a VM that Rx was sent into pt's pharmacy.

## 2019-02-27 DIAGNOSIS — I10 Essential (primary) hypertension: Secondary | ICD-10-CM | POA: Diagnosis not present

## 2019-02-27 DIAGNOSIS — E119 Type 2 diabetes mellitus without complications: Secondary | ICD-10-CM | POA: Diagnosis not present

## 2019-02-27 DIAGNOSIS — E782 Mixed hyperlipidemia: Secondary | ICD-10-CM | POA: Diagnosis not present

## 2019-03-14 DIAGNOSIS — I10 Essential (primary) hypertension: Secondary | ICD-10-CM | POA: Diagnosis not present

## 2019-03-14 DIAGNOSIS — E782 Mixed hyperlipidemia: Secondary | ICD-10-CM | POA: Diagnosis not present

## 2019-03-25 ENCOUNTER — Telehealth (INDEPENDENT_AMBULATORY_CARE_PROVIDER_SITE_OTHER): Payer: Medicare Other | Admitting: Urology

## 2019-03-25 ENCOUNTER — Ambulatory Visit (INDEPENDENT_AMBULATORY_CARE_PROVIDER_SITE_OTHER): Payer: Medicare Other | Admitting: Nurse Practitioner

## 2019-03-25 ENCOUNTER — Other Ambulatory Visit: Payer: Self-pay

## 2019-03-25 ENCOUNTER — Telehealth: Payer: Self-pay | Admitting: Urology

## 2019-03-25 DIAGNOSIS — N390 Urinary tract infection, site not specified: Secondary | ICD-10-CM | POA: Diagnosis not present

## 2019-03-25 DIAGNOSIS — R3914 Feeling of incomplete bladder emptying: Secondary | ICD-10-CM

## 2019-03-25 MED ORDER — CIPROFLOXACIN HCL 500 MG PO TABS: 500.0000 mg | ORAL_TABLET | Freq: Two times a day (BID) | ORAL | 0 refills | Status: DC

## 2019-03-25 NOTE — Telephone Encounter (Signed)
-----   Message from Sondra Come, MD sent at 03/25/2019 11:13 AM EDT ----- Regarding: follow up Schedule follow up in person in 8 weeks with [facility] to discuss cic and holep.  Legrand Rams, MD 03/25/2019

## 2019-03-25 NOTE — Telephone Encounter (Signed)
App made and patient is aware 

## 2019-03-25 NOTE — Progress Notes (Signed)
Virtual Visit via Telephone Note  I connected with Patrick Patel on 03/25/19 at 11:00 AM EDT by telephone and verified that I am speaking with the correct person using two identifiers.   I discussed the limitations, risks, security and privacy concerns of performing an evaluation and management service by telephone and the availability of in person appointments. We discussed the impact of the COVID-19 on the healthcare system, and the importance of social distancing and reducing patient and provider exposure. I also discussed with the patient that there may be a patient responsible charge related to this service. The patient expressed understanding and agreed to proceed.   Reason for visit: Recurrent UTIs, incomplete bladder emptying  History of Present Illness: Patrick Patel is a 78 year old male with past medical history notable for diabetes, last hemoglobin A1c 7.3 and history of recurrent UTIs and incomplete bladder emptying.  He was last seen by urology in October 2018 by Dr. Junious Silk and found to have an elevated PVR of 900 cc.  He was started on alfuzosin at that time, however did not follow-up.  He reports he has 3-4 urinary tract infections per year with dysuria, cloudy urine, and foul odor.  He reports baseline urinary symptoms for greater than 10 years of feeling of incomplete emptying with straining and intermittency.  A PSA was performed in July 2019 and was 4.3, which is within the normal range for his age.  He denies any history of gross hematuria not associated with a urinary tract infection.  CT scan 12/19/2018 showed a distended bladder with no hydronephrosis, and enlarged prostate at 82 g.  Renal function is normal with creatinine 0.88, EGFR of 84.  He feels he currently has a urinary tract infection with dysuria and cloudy urine.  He denies fevers, chills, or flank pain.  He has very afraid to leave his home with the current COVID-19 pandemic, and is not willing to drop off a urine  sample.  He also does not travel well at baseline, and he would be unwilling to go to [facility] for urodynamics.  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  I had a long conversation with the patient today about possible etiologies of incomplete bladder emptying and recurrent UTIs.  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  We discussed standard of care would be evaluation with urodynamics to evaluate if this is a bladder problem or prostate blockage problem.  He is unwilling to travel to [facility] for urodynamics.  I reinforced at length to the patient that without treatment he is likely to have ongoing urinary infections that could potentially evolve to sepsis, possible urinary retention requiring emergent Foley placement, as well as potential renal damage.  Follow Up: - 10 days Cipro 534m BID for presumed UTI -RTC 6-8 weeks with PVR, likely teach CIC at that time, re-discuss UDS and HOLEP   I discussed the assessment and treatment plan with the patient. The patient was provided an opportunity to ask questions and all were answered. The patient agreed with the plan and demonstrated an understanding of the instructions.   The patient was advised to call back or seek an in-person evaluation if the symptoms worsen or if the condition fails to improve as anticipated.  I provided 18 minutes of non-face-to-face time during this encounter.   BBilley Co MD

## 2019-03-27 ENCOUNTER — Ambulatory Visit: Payer: Medicare Other | Admitting: Urology

## 2019-04-23 DIAGNOSIS — E119 Type 2 diabetes mellitus without complications: Secondary | ICD-10-CM | POA: Diagnosis not present

## 2019-04-30 ENCOUNTER — Telehealth: Payer: Self-pay | Admitting: Family Medicine

## 2019-04-30 NOTE — Telephone Encounter (Unsigned)
Copied from CRM [id number]. Topic: Quick Communication - Rx Refill/Question >> Apr 30, 2019  4:16 PM Wyonia Hough E wrote: Medication: Pt doesn't know what meds needs to be refilled and is not able to get in contact with his pharmacy. Please give Pt a call and send any refills that are due

## 2019-05-01 ENCOUNTER — Telehealth: Payer: Self-pay

## 2019-05-01 NOTE — Telephone Encounter (Signed)
Copied from CRM [id number]. Topic: Quick Communication - Rx Refill/Question >> Apr 30, 2019  4:16 PM Wyonia Hough E wrote: Medication: Pt doesn't know what meds needs to be refilled and is not able to get in contact with his pharmacy. Please give Pt a call and send any refills that are due   Called patient and left a message to call back.  Patrick Patel,cma

## 2019-05-02 NOTE — Telephone Encounter (Signed)
Pt called shelby back and could not reach her. Please advise.

## 2019-05-02 NOTE — Telephone Encounter (Signed)
Called pt and left a VM to call back.  

## 2019-05-15 ENCOUNTER — Telehealth: Payer: Self-pay | Admitting: Urology

## 2019-05-15 NOTE — Telephone Encounter (Signed)
Pt calling office asking to change his appt to a virtual phone visit, pt has several underlying health issues and doesn't want to come to the office. Please advise as the last virtual appt states to see pt in person.  [phone]

## 2019-05-16 ENCOUNTER — Telehealth: Payer: Self-pay | Admitting: *Deleted

## 2019-05-16 NOTE — Telephone Encounter (Signed)
Left message to return call 

## 2019-05-16 NOTE — Telephone Encounter (Signed)
Spoke with patient informed him the need for his appointment to come in the office-He refuses to come in due to his health and COVID.

## 2019-05-16 NOTE — Telephone Encounter (Signed)
Copied from CRM [id number]. Topic: Quick Communication - See Telephone Encounter >> May 15, 2019  4:44 PM Aretta Nip wrote: CRM for notification. See Telephone encounter for: 05/15/19. This is after hrs call. Pt has appt on 6/10 with [facility] and he wants to switch it to a virtual appt. Please FU but he wants to be called after 3:00 pm! He sleeps in the day time. [phone]

## 2019-05-17 ENCOUNTER — Encounter: Payer: Self-pay | Admitting: Family Medicine

## 2019-05-21 ENCOUNTER — Other Ambulatory Visit: Payer: Self-pay

## 2019-05-21 ENCOUNTER — Encounter: Payer: Self-pay | Admitting: Family Medicine

## 2019-05-21 ENCOUNTER — Ambulatory Visit (INDEPENDENT_AMBULATORY_CARE_PROVIDER_SITE_OTHER): Payer: Medicare Other | Admitting: Family Medicine

## 2019-05-21 DIAGNOSIS — R079 Chest pain, unspecified: Secondary | ICD-10-CM

## 2019-05-21 DIAGNOSIS — R3916 Straining to void: Secondary | ICD-10-CM

## 2019-05-21 DIAGNOSIS — Z794 Long term (current) use of insulin: Secondary | ICD-10-CM

## 2019-05-21 DIAGNOSIS — N401 Enlarged prostate with lower urinary tract symptoms: Secondary | ICD-10-CM | POA: Diagnosis not present

## 2019-05-21 DIAGNOSIS — E1140 Type 2 diabetes mellitus with diabetic neuropathy, unspecified: Secondary | ICD-10-CM | POA: Diagnosis not present

## 2019-05-21 NOTE — Progress Notes (Signed)
Patient said he checked his blood sugar today and it was 173.

## 2019-05-21 NOTE — Progress Notes (Signed)
Virtual Visit via telephone Note  This visit type was conducted due to national recommendations for restrictions regarding the COVID-19 pandemic (e.g. social distancing).  This format is felt to be most appropriate for this patient at this time.  All issues noted in this document were discussed and addressed.  No physical exam was performed (except for noted visual exam findings with Video Visits).   I connected with Patrick Patel today at  3:30 PM EDT by telephone and verified that I am speaking with the correct person using two identifiers. Location patient: home Location provider: work Persons participating in the virtual visit: patient, provider  I discussed the limitations, risks, security and privacy concerns of performing an evaluation and management service by telephone and the availability of in person appointments. I also discussed with the patient that there may be a patient responsible charge related to this service. The patient expressed understanding and agreed to proceed.  Interactive audio and video telecommunications were attempted between this provider and patient, however failed, due to patient having technical difficulties OR patient did not have access to video capability.  We continued and completed visit with audio only.   Reason for visit: follow-up  HPI: DIABETES Disease Monitoring: Blood Sugar ranges-173 today, has been a little higher as he has not been able to exercise much recently Polyuria/phagia/dipsia- no      Optho- UTD Medications: Compliance- taking NPH 20 u am and 15 u PM, jardiance, glipizide, metformin Hypoglycemic symptoms- no  Chest pain: patient notes this is stable from previously. Improves with activity. No dyspnea with this. He has refused further evaluation of this.  BPH/recurrent UTI: patient did a virtual visit with urology. He was treated empirically for a UTI with cipro. He notes his symptoms have improved for the most part and he will only  occasionally have dysuria and cloudy urine. He notes his flow is restricted though he notes it is improved compared to previously. Occasional urgency. Continues on alfuzosin. Patient notes he wants to avoid going to any visits with physicians right now given the [place] pandemic.   ROS: See pertinent positives and negatives per HPI.  Past Medical History:  Diagnosis Date  . Diabetes ([place])   . Hyperlipidemia   . Hypertension     No past surgical history on file.  Family History  Problem Relation Age of Onset  . Breast cancer Mother   . Thyroid cancer Mother     SOCIAL HX: former smoker, current smokeless tobacco user   Current Outpatient Medications:  .  ACCU-CHEK SOFTCLIX LANCETS lancets, Use as instructedUSE TO CHECK BLOOD GLUCOSE 3 TIMES DAILY, Disp: 300 each, Rfl: 2 .  acetaminophen (TYLENOL) 500 MG tablet, Take 1,000 mg by mouth 2 (two) times daily at 10 AM and 5 PM. , Disp: , Rfl:  .  alfuzosin (UROXATRAL) 10 MG 24 hr tablet, Take 1 tablet (10 mg total) by mouth daily with breakfast., Disp: 30 tablet, Rfl: 11 .  aspirin EC 325 MG tablet, Take 650 mg by mouth daily. , Disp: , Rfl:  .  blood glucose meter kit and supplies KIT, Dispense Accu-Chek Aviva plus. Check once daily as directed. For ICD 10 code E11.9., Disp: 1 each, Rfl: 0 .  Calcium-Magnesium-Zinc 370-48-8 MG TABS, Take 4 tablets by mouth daily., Disp: , Rfl:  .  chlorthalidone (HYGROTON) 50 MG tablet, Take 1 tablet (50 mg total) by mouth daily., Disp: 90 tablet, Rfl: 0 .  ciprofloxacin (CIPRO) 500 MG tablet, Take 1 tablet (  500 mg total) by mouth every 12 (twelve) hours., Disp: 20 tablet, Rfl: 0 .  Cranberry 125 MG TABS, Take 4 tablets by mouth daily., Disp: , Rfl:  .  diphenhydrAMINE (BENADRYL) 25 MG tablet, Take 50 mg by mouth at bedtime as needed for sleep., Disp: , Rfl:  .  empagliflozin (JARDIANCE) 10 MG TABS tablet, Take 10 mg by mouth 2 (two) times daily., Disp: 180 tablet, Rfl: 0 .  glipiZIDE (GLUCOTROL) 10 MG  tablet, TAKE 2 TABLETS BY MOUTH TWO TIMES DAILY, Disp: 360 tablet, Rfl: 3 .  glucose blood (ACCU-CHEK AVIVA PLUS) test strip, USE TO CHECK BLOOD GLUCOSE 3 TIMES DAILY, Disp: 300 each, Rfl: 3 .  insulin NPH Human (HUMULIN N) 100 UNIT/ML injection, INJECT 20 UNITS IN THE AM AND 15 UNITS AT BEDTIME, Disp: 10 mL, Rfl: 3 .  Insulin Syringe-Needle U-100 (BD INSULIN SYRINGE U/F) 31G X 5/16" 0.3 ML MISC, USE TWICE DAILY WITH INSULIN, Disp: 180 each, Rfl: 2 .  metFORMIN (GLUCOPHAGE-XR) 500 MG 24 hr tablet, TAKE 4 TABLETS BY MOUTH ONCE DAILY WITH BREAKFAST, Disp: 360 tablet, Rfl: 1 .  Multiple Vitamins-Minerals (MULTIVITAMIN WITH MINERALS) tablet, Take 2 tablets by mouth daily., Disp: , Rfl:  .  Omega-3 Fatty Acids (FISH OIL) 1000 MG CPDR, Take 4 capsules by mouth every other day., Disp: , Rfl:  .  rosuvastatin (CRESTOR) 20 MG tablet, Take 1 tablet (20 mg total) by mouth daily., Disp: 90 tablet, Rfl: 3 .  triamcinolone cream (KENALOG) 0.1 %, APPLY CREAM EXTERNALLY 2  TIMES DAILY AS NEEDED, Disp: 45 g, Rfl: 0  EXAM: This was a telehealth telephone visit and thus no physical exam was completed.   ASSESSMENT AND PLAN:  Discussed the following assessment and plan:  Chest pain Chronic issue. Stable. He will monitor and be evaluated if he develops worsening or persistent symptoms.   BPH (benign prostatic hyperplasia) Slightly improved. I encouraged him to follow-up with urology as scheduled. Discussed that if he has recurrent UTI symptoms he would need to be evaluated with urine studies.   Type 2 diabetes mellitus with diabetic neuropathy, unspecified ([facility]) Uncontrolled. Patient is hesitant to come in for labs at this time. Discussed checking an A1c in 2 months and f/u in 4 months.   [place] office staff will contact patient to get him scheduled for labs in 2 months and follow-up for 4 months.  Social distancing precautions and sick precautions given regarding COVID-19.   I discussed the assessment and  treatment plan with the patient. The patient was provided an opportunity to ask questions and all were answered. The patient agreed with the plan and demonstrated an understanding of the instructions.   The patient was advised to call back or seek an in-person evaluation if the symptoms worsen or if the condition fails to improve as anticipated.  I provided 23 minutes of non-face-to-face time during this encounter.   [name], MD

## 2019-05-22 NOTE — Assessment & Plan Note (Signed)
Uncontrolled. Patient is hesitant to come in for labs at this time. Discussed checking an A1c in 2 months and f/u in 4 months.

## 2019-05-22 NOTE — Assessment & Plan Note (Signed)
Chronic issue. Stable. He will monitor and be evaluated if he develops worsening or persistent symptoms.

## 2019-05-22 NOTE — Assessment & Plan Note (Signed)
Slightly improved. I encouraged him to follow-up with urology as scheduled. Discussed that if he has recurrent UTI symptoms he would need to be evaluated with urine studies.

## 2019-05-23 ENCOUNTER — Other Ambulatory Visit: Payer: Self-pay | Admitting: Family Medicine

## 2019-05-29 ENCOUNTER — Ambulatory Visit: Payer: Medicare Other | Admitting: Urology

## 2019-06-03 DIAGNOSIS — E119 Type 2 diabetes mellitus without complications: Secondary | ICD-10-CM | POA: Diagnosis not present

## 2019-06-03 DIAGNOSIS — I10 Essential (primary) hypertension: Secondary | ICD-10-CM | POA: Diagnosis not present

## 2019-06-03 DIAGNOSIS — E782 Mixed hyperlipidemia: Secondary | ICD-10-CM | POA: Diagnosis not present

## 2019-06-13 ENCOUNTER — Other Ambulatory Visit: Payer: Self-pay | Admitting: Family Medicine

## 2019-07-03 DIAGNOSIS — I10 Essential (primary) hypertension: Secondary | ICD-10-CM | POA: Diagnosis not present

## 2019-07-03 DIAGNOSIS — Z0000 Encounter for general adult medical examination without abnormal findings: Secondary | ICD-10-CM | POA: Diagnosis not present

## 2019-07-03 DIAGNOSIS — E119 Type 2 diabetes mellitus without complications: Secondary | ICD-10-CM | POA: Diagnosis not present

## 2019-07-13 ENCOUNTER — Other Ambulatory Visit: Payer: Self-pay | Admitting: Family Medicine

## 2019-07-21 DIAGNOSIS — E119 Type 2 diabetes mellitus without complications: Secondary | ICD-10-CM | POA: Diagnosis not present

## 2019-07-21 DIAGNOSIS — I10 Essential (primary) hypertension: Secondary | ICD-10-CM | POA: Diagnosis not present

## 2019-07-25 ENCOUNTER — Other Ambulatory Visit: Payer: Self-pay | Admitting: Family Medicine

## 2019-08-10 ENCOUNTER — Other Ambulatory Visit: Payer: Self-pay | Admitting: Family Medicine

## 2019-08-11 ENCOUNTER — Other Ambulatory Visit: Payer: Self-pay

## 2019-08-17 ENCOUNTER — Other Ambulatory Visit: Payer: Self-pay | Admitting: Family Medicine

## 2019-08-19 ENCOUNTER — Telehealth: Payer: Self-pay | Admitting: Family Medicine

## 2019-08-19 NOTE — Telephone Encounter (Unsigned)
Copied from [place] [id number]. Topic: General - Other >> Aug 19, 2019  4:43 PM Rainey Pines A wrote: Patient scheduled follow up appointment in December 2020 and wanted to know when was the latest he could get labs that were ordered done before this appointment and resulted by Dr. Caryl Bis .

## 2019-08-19 NOTE — Telephone Encounter (Signed)
Copied from [place] [id number]. Topic: Quick Communication - Rx Refill/Question >> Aug 19, 2019  4:37 PM Patrick Patel, [place] A wrote:   Medication: Patient would like a callback from nurse in regards to his metFORMIN (GLUCOPHAGE-XR) 500 MG 24 hr tablet  that has been recalled and he has stopped taking medication. Patient would like the name brand called in to his pharmacy.  Has the patient contacted their pharmacy? {Yes (Agent: If no, request that the patient contact the pharmacy for the refill.) (Agent: If yes, when and what did the pharmacy advise?)Contact PCP  Preferred Pharmacy (with phone number or street name): [id number] - [place], [place] - [address] [phone] (Phone) [phone] (Fax)    Agent: Please be advised that RX refills may take up to 3 business days. We ask that you follow-up with your pharmacy.

## 2019-08-21 DIAGNOSIS — E119 Type 2 diabetes mellitus without complications: Secondary | ICD-10-CM | POA: Diagnosis not present

## 2019-08-21 DIAGNOSIS — I10 Essential (primary) hypertension: Secondary | ICD-10-CM | POA: Diagnosis not present

## 2019-08-21 NOTE — Telephone Encounter (Signed)
I called and left a message for the patient to call the office and schedule for the labs that is active now or he could wait and call to schedule 1 week before his visit. I asked for a call back  Nina,cma

## 2019-08-25 ENCOUNTER — Ambulatory Visit: Payer: Medicare Other | Admitting: Urology

## 2019-08-26 NOTE — Telephone Encounter (Signed)
Called and left a message on voicemail informing patient to call and schedule a lab appointment because his orders are there.  Ina,cma

## 2019-09-04 ENCOUNTER — Other Ambulatory Visit: Payer: Self-pay | Admitting: Family Medicine

## 2019-09-13 ENCOUNTER — Other Ambulatory Visit: Payer: Self-pay | Admitting: Family Medicine

## 2019-09-15 ENCOUNTER — Other Ambulatory Visit: Payer: Self-pay | Admitting: Family Medicine

## 2019-09-16 ENCOUNTER — Other Ambulatory Visit: Payer: Self-pay | Admitting: Family Medicine

## 2019-09-16 NOTE — Telephone Encounter (Signed)
Medication Refill - Medication:  ACCU-CHEK SOFTCLIX LANCETS lancets     acetaminophen (TYLENOL) 500 MG tablet    alfuzosin (UROXATRAL) 10 MG 24 hr tablet    aspirin EC 325 MG tablet    blood glucose meter kit and supplies KIT    Calcium-Magnesium-Zinc 167-83-8 MG TABS    chlorthalidone (HYGROTON) 50 MG tablet    ciprofloxacin (CIPRO) 500 MG tablet    Cranberry 125 MG TABS    diphenhydrAMINE (BENADRYL) 25 MG tablet    FLUZONE HIGH-DOSE QUADRIVALENT 0.7 ML SUSY    glipiZIDE (GLUCOTROL) 10 MG tablet    glucose blood (ACCU-CHEK AVIVA PLUS) test strip    insulin NPH Human (HUMULIN N) 100 UNIT/ML injection    Insulin Syringe-Needle U-100 (BD INSULIN SYRINGE U/F) 31G X 5/16" 0.3 ML MISC    JARDIANCE 10 MG TABS tablet    metFORMIN (GLUCOPHAGE-XR) 500 MG 24 hr tablet    Multiple Vitamins-Minerals (MULTIVITAMIN WITH MINERALS) tablet    Omega-3 Fatty Acids (FISH OIL) 1000 MG CPDR    rosuvastatin (CRESTOR) 20 MG tablet    triamcinolone cream (KENALOG) 0.1 %      Has the patient contacted their pharmacy? Yes - states that he needs to get all refills from [facility] from this time on because he wants prescriptions delivered.  [facility] so he needs office to call in new prescriptions for everything. (Agent: If no, request that the patient contact the pharmacy for the refill.) (Agent: If yes, when and what did the pharmacy advise?)  Preferred Pharmacy (with phone number or street name): [place], [place] [phone] (Phone) [phone] (Fax)   Agent: Please be advised that RX refills may take up to 3 business days. We ask that you follow-up with your pharmacy.

## 2019-09-17 ENCOUNTER — Other Ambulatory Visit: Payer: Self-pay

## 2019-09-17 MED ORDER — METFORMIN HCL ER 500 MG PO TB24: ORAL_TABLET | ORAL | 1 refills | Status: DC

## 2019-09-17 MED ORDER — ROSUVASTATIN CALCIUM 20 MG PO TABS: 20.0000 mg | ORAL_TABLET | Freq: Every day | ORAL | 3 refills | Status: DC

## 2019-09-17 MED ORDER — GLIPIZIDE 10 MG PO TABS: ORAL_TABLET | ORAL | 3 refills | Status: DC

## 2019-09-17 MED ORDER — INSULIN NPH (HUMAN) (ISOPHANE) 100 UNIT/ML ~~LOC~~ SUSP: SUBCUTANEOUS | 1 refills | Status: DC

## 2019-09-17 MED ORDER — ACCU-CHEK SOFTCLIX LANCETS MISC: 2 refills | Status: DC

## 2019-09-17 MED ORDER — ALFUZOSIN HCL ER 10 MG PO TB24: 10.0000 mg | ORAL_TABLET | Freq: Every day | ORAL | 3 refills | Status: DC

## 2019-09-17 MED ORDER — TRIAMCINOLONE ACETONIDE 0.1 % EX CREA: TOPICAL_CREAM | CUTANEOUS | 0 refills | Status: DC

## 2019-09-17 MED ORDER — ACCU-CHEK AVIVA PLUS VI STRP: ORAL_STRIP | 3 refills | Status: DC

## 2019-09-17 MED ORDER — CHLORTHALIDONE 50 MG PO TABS: 50.0000 mg | ORAL_TABLET | Freq: Every day | ORAL | 0 refills | Status: DC

## 2019-09-17 MED ORDER — "INSULIN SYRINGE-NEEDLE U-100 31G X 5/16"" 0.3 ML MISC": 2 refills | Status: DC

## 2019-09-17 MED ORDER — JARDIANCE 10 MG PO TABS: 10.0000 mg | ORAL_TABLET | Freq: Two times a day (BID) | ORAL | 0 refills | Status: DC

## 2019-09-17 NOTE — Telephone Encounter (Signed)
I have LM with patient that I have sent medications that are NOT OTC & ones that are prescribed by Dr. Caryl Bis to [facility].

## 2019-09-19 ENCOUNTER — Telehealth: Payer: Self-pay

## 2019-09-19 NOTE — Telephone Encounter (Signed)
Copied from [place] [id number]. Topic: General - Other >> Sep 18, 2019  5:18 PM Rutherford Nail, [place] wrote: Reason for CRM: Patient calling and states that he wanted to inform Dr Caryl Bis that he received his high dose flu shot 07/2019.

## 2019-09-19 NOTE — Telephone Encounter (Signed)
Already documented came in from outside sources.  Reagan Klemz,cma

## 2019-10-01 DIAGNOSIS — E119 Type 2 diabetes mellitus without complications: Secondary | ICD-10-CM | POA: Diagnosis not present

## 2019-10-01 DIAGNOSIS — I10 Essential (primary) hypertension: Secondary | ICD-10-CM | POA: Diagnosis not present

## 2019-10-28 DIAGNOSIS — E119 Type 2 diabetes mellitus without complications: Secondary | ICD-10-CM | POA: Diagnosis not present

## 2019-10-28 DIAGNOSIS — I10 Essential (primary) hypertension: Secondary | ICD-10-CM | POA: Diagnosis not present

## 2019-11-17 ENCOUNTER — Other Ambulatory Visit: Payer: Self-pay | Admitting: Family Medicine

## 2019-11-17 NOTE — Telephone Encounter (Signed)
I sent in a 90 day supply. Please follow-up with him to get him scheduled for labs. Thanks.

## 2019-11-18 NOTE — Telephone Encounter (Signed)
I called an left a message for the patient informing him that his medication was sent to the pharmacy and he needs to call the office to schedule a lab appointment.  Nina,cma

## 2019-11-19 ENCOUNTER — Encounter: Payer: Self-pay | Admitting: Family Medicine

## 2019-11-19 ENCOUNTER — Other Ambulatory Visit: Payer: Self-pay

## 2019-11-19 ENCOUNTER — Ambulatory Visit (INDEPENDENT_AMBULATORY_CARE_PROVIDER_SITE_OTHER): Payer: Medicare Other | Admitting: Family Medicine

## 2019-11-19 DIAGNOSIS — I10 Essential (primary) hypertension: Secondary | ICD-10-CM | POA: Diagnosis not present

## 2019-11-19 DIAGNOSIS — E785 Hyperlipidemia, unspecified: Secondary | ICD-10-CM | POA: Diagnosis not present

## 2019-11-19 DIAGNOSIS — K219 Gastro-esophageal reflux disease without esophagitis: Secondary | ICD-10-CM | POA: Diagnosis not present

## 2019-11-19 DIAGNOSIS — Z794 Long term (current) use of insulin: Secondary | ICD-10-CM | POA: Diagnosis not present

## 2019-11-19 DIAGNOSIS — E1140 Type 2 diabetes mellitus with diabetic neuropathy, unspecified: Secondary | ICD-10-CM

## 2019-11-19 MED ORDER — METFORMIN HCL ER 500 MG PO TB24: ORAL_TABLET | ORAL | 1 refills | Status: DC

## 2019-11-19 MED ORDER — AMLODIPINE BESYLATE 2.5 MG PO TABS: 2.5000 mg | ORAL_TABLET | Freq: Every day | ORAL | 3 refills | Status: DC

## 2019-11-19 MED ORDER — CHLORTHALIDONE 50 MG PO TABS: 50.0000 mg | ORAL_TABLET | Freq: Every day | ORAL | 0 refills | Status: DC

## 2019-11-19 MED ORDER — JARDIANCE 10 MG PO TABS: 10.0000 mg | ORAL_TABLET | Freq: Two times a day (BID) | ORAL | 0 refills | Status: DC

## 2019-11-19 NOTE — Progress Notes (Signed)
Virtual Visit via telephone Note  This visit type was conducted due to national recommendations for restrictions regarding the COVID-19 pandemic (e.g. social distancing).  This format is felt to be most appropriate for this patient at this time.  All issues noted in this document were discussed and addressed.  No physical exam was performed (except for noted visual exam findings with Video Visits).   I connected with Patrick Patel today at  4:00 PM EST by telephone and verified that I am speaking with the correct person using two identifiers. Location patient: home Location provider: home office Persons participating in the virtual visit: patient, provider  I discussed the limitations, risks, security and privacy concerns of performing an evaluation and management service by telephone and the availability of in person appointments. I also discussed with the patient that there may be a patient responsible charge related to this service. The patient expressed understanding and agreed to proceed.  Interactive audio and video telecommunications were attempted between this provider and patient, however failed, due to patient having technical difficulties OR patient did not have access to video capability.  We continued and completed visit with audio only.  Reason for visit: Follow-up.  HPI: Diabetes: Typically 140-230.  Taking Jardiance, glipizide, NPH 22-23 units in the morning and 16-18 units at night.  Taking Metformin as well.  Some polydipsia at nighttime.  No hypoglycemia.  He refuses to come in for lab work until he gets a COVID-19 vaccine.  Hypertension: Has been mostly in the 140s over 60s-70s.  He has chronic stable chest pain that resolves with exertion.  He has chronic edema which is up-and-down.  No shortness of breath.  No orthopnea.  No PND.  Hyperlipidemia: Taking Crestor.  No right upper quadrant pain or myalgias.  GERD: Rarely gets reflux twice a year.  He does report  indigestion which is described as epigastric burning discomfort and left chest burning discomfort.  He will take Rolaids and that will resolve.  That occurs every 6 to 8 days.  He notes much less indigestion than he has had in the past.   ROS: See pertinent positives and negatives per HPI.  Past Medical History:  Diagnosis Date  . Diabetes ([place])   . Hyperlipidemia   . Hypertension     No past surgical history on file.  Family History  Problem Relation Age of Onset  . Breast cancer Mother   . Thyroid cancer Mother     SOCIAL HX: Former smoker   Current Outpatient Medications:  .  Accu-Chek Softclix Lancets lancets, Use as instructedUSE TO CHECK BLOOD GLUCOSE 3 TIMES DAILY, Disp: 300 each, Rfl: 2 .  acetaminophen (TYLENOL) 500 MG tablet, Take 1,000 mg by mouth 2 (two) times daily at 10 AM and 5 PM. , Disp: , Rfl:  .  alfuzosin (UROXATRAL) 10 MG 24 hr tablet, Take 1 tablet (10 mg total) by mouth daily with breakfast., Disp: 90 tablet, Rfl: 3 .  amLODipine (NORVASC) 2.5 MG tablet, Take 1 tablet (2.5 mg total) by mouth daily., Disp: 90 tablet, Rfl: 3 .  aspirin EC 325 MG tablet, Take 650 mg by mouth daily. , Disp: , Rfl:  .  blood glucose meter kit and supplies KIT, Dispense Accu-Chek Aviva plus. Check once daily as directed. For ICD 10 code E11.9., Disp: 1 each, Rfl: 0 .  Calcium-Magnesium-Zinc 003-70-4 MG TABS, Take 4 tablets by mouth daily., Disp: , Rfl:  .  chlorthalidone (HYGROTON) 50 MG tablet, Take 1 tablet (  50 mg total) by mouth daily., Disp: 90 tablet, Rfl: 0 .  Cranberry 125 MG TABS, Take 4 tablets by mouth daily., Disp: , Rfl:  .  diphenhydrAMINE (BENADRYL) 25 MG tablet, Take 50 mg by mouth at bedtime as needed for sleep., Disp: , Rfl:  .  empagliflozin (JARDIANCE) 10 MG TABS tablet, Take 10 mg by mouth 2 (two) times daily., Disp: 180 tablet, Rfl: 0 .  glipiZIDE (GLUCOTROL) 10 MG tablet, TAKE 2 TABLETS BY MOUTH TWO TIMES DAILY, Disp: 360 tablet, Rfl: 3 .  glucose blood  (ACCU-CHEK AVIVA PLUS) test strip, USE TO CHECK BLOOD GLUCOSE 3 TIMES DAILY, Disp: 300 each, Rfl: 3 .  insulin NPH Human (HUMULIN N) 100 UNIT/ML injection, INJECT SUBCUTANEOUSLY 20  UNITS IN THE MORNING AND 15 UNITS AT BEDTIME, Disp: 40 mL, Rfl: 0 .  Insulin Syringe-Needle U-100 (BD INSULIN SYRINGE U/F) 31G X 5/16" 0.3 ML MISC, USE TWICE DAILY WITH INSULIN, Disp: 200 each, Rfl: 2 .  metFORMIN (GLUCOPHAGE-XR) 500 MG 24 hr tablet, TAKE 4 TABLETS BY MOUTH ONCE DAILY WITH BREAKFAST, Disp: 360 tablet, Rfl: 1 .  Multiple Vitamins-Minerals (MULTIVITAMIN WITH MINERALS) tablet, Take 2 tablets by mouth daily., Disp: , Rfl:  .  Omega-3 Fatty Acids (FISH OIL) 1000 MG CPDR, Take 4 capsules by mouth every other day., Disp: , Rfl:  .  rosuvastatin (CRESTOR) 20 MG tablet, Take 1 tablet (20 mg total) by mouth daily., Disp: 90 tablet, Rfl: 3 .  triamcinolone cream (KENALOG) 0.1 %, APPLY CREAM EXTERNALLY 2  TIMES DAILY AS NEEDED, Disp: 45 g, Rfl: 0  EXAM: This is a telehealth telephone visit and thus no physical exam was completed.  ASSESSMENT AND PLAN:  Discussed the following assessment and plan:  Essential hypertension We will add a low-dose of amlodipine given his uncontrolled blood pressures.  Discussed he needs to monitor for any lightheadedness with this and if that occurs he needs let us know.  He will continue his chlorthalidone as well.  Type 2 diabetes mellitus with diabetic neuropathy, unspecified ([place]) He will continue his current regimen.  I discussed the need for lab work and encouraged him to get this though he declines until he gets his COVID-19 vaccine.  Hyperlipidemia Continue Crestor.  GERD (gastroesophageal reflux disease) Discussed that his indigestion symptoms seem to be reflux related versus gastritis related.  Discussed starting on a PPI though he declines given that he has relatively infrequent symptoms.  Discussed if he had any increased frequency in symptoms he should let us know  and we can start on a PPI.    I discussed the assessment and treatment plan with the patient. The patient was provided an opportunity to ask questions and all were answered. The patient agreed with the plan and demonstrated an understanding of the instructions.   The patient was advised to call back or seek an in-person evaluation if the symptoms worsen or if the condition fails to improve as anticipated.  I provided 24 minutes of non-face-to-face time during this encounter.   [name], MD

## 2019-11-20 DIAGNOSIS — K219 Gastro-esophageal reflux disease without esophagitis: Secondary | ICD-10-CM | POA: Insufficient documentation

## 2019-11-20 NOTE — Assessment & Plan Note (Signed)
He will continue his current regimen.  I discussed the need for lab work and encouraged him to get this though he declines until he gets his COVID-19 vaccine.

## 2019-11-20 NOTE — Assessment & Plan Note (Signed)
We will add a low-dose of amlodipine given his uncontrolled blood pressures.  Discussed he needs to monitor for any lightheadedness with this and if that occurs he needs let us know.  He will continue his chlorthalidone as well.

## 2019-11-20 NOTE — Assessment & Plan Note (Signed)
Continue Crestor 

## 2019-11-20 NOTE — Assessment & Plan Note (Signed)
Discussed that his indigestion symptoms seem to be reflux related versus gastritis related.  Discussed starting on a PPI though he declines given that he has relatively infrequent symptoms.  Discussed if he had any increased frequency in symptoms he should let us know and we can start on a PPI.

## 2019-11-25 DIAGNOSIS — I10 Essential (primary) hypertension: Secondary | ICD-10-CM | POA: Diagnosis not present

## 2019-11-25 DIAGNOSIS — E119 Type 2 diabetes mellitus without complications: Secondary | ICD-10-CM | POA: Diagnosis not present

## 2019-11-28 ENCOUNTER — Other Ambulatory Visit: Payer: Self-pay | Admitting: Family Medicine

## 2019-12-24 ENCOUNTER — Ambulatory Visit: Payer: Medicare Other | Admitting: Urology

## 2019-12-29 ENCOUNTER — Other Ambulatory Visit: Payer: Self-pay | Admitting: Family Medicine

## 2020-01-21 DIAGNOSIS — I10 Essential (primary) hypertension: Secondary | ICD-10-CM | POA: Diagnosis not present

## 2020-01-21 DIAGNOSIS — E119 Type 2 diabetes mellitus without complications: Secondary | ICD-10-CM | POA: Diagnosis not present

## 2020-01-21 DIAGNOSIS — E782 Mixed hyperlipidemia: Secondary | ICD-10-CM | POA: Diagnosis not present

## 2020-02-03 ENCOUNTER — Other Ambulatory Visit: Payer: Self-pay | Admitting: Family Medicine

## 2020-02-16 ENCOUNTER — Other Ambulatory Visit: Payer: Self-pay | Admitting: Family Medicine

## 2020-02-18 DIAGNOSIS — E119 Type 2 diabetes mellitus without complications: Secondary | ICD-10-CM | POA: Diagnosis not present

## 2020-02-18 DIAGNOSIS — I10 Essential (primary) hypertension: Secondary | ICD-10-CM | POA: Diagnosis not present

## 2020-02-20 ENCOUNTER — Telehealth: Payer: Self-pay | Admitting: Family Medicine

## 2020-02-20 DIAGNOSIS — R350 Frequency of micturition: Secondary | ICD-10-CM | POA: Diagnosis not present

## 2020-02-20 DIAGNOSIS — N390 Urinary tract infection, site not specified: Secondary | ICD-10-CM | POA: Diagnosis not present

## 2020-02-20 NOTE — Telephone Encounter (Signed)
Pt is wanting a urinalysis. He has an appt at 4:00 on 02/23/2020 for telephone visit with Dr Kathie Rhodes for possible UTI. Please advise.  Tahmid Stonehocker,cma

## 2020-02-20 NOTE — Telephone Encounter (Signed)
Pt is wanting a urinalysis. He has an appt at 4:00 on 02/23/2020 for telephone visit with Dr Kathie Rhodes for possible UTI

## 2020-02-20 NOTE — Telephone Encounter (Signed)
It is ok to order a UA for him to complete prior to his visit.

## 2020-02-20 NOTE — Telephone Encounter (Signed)
I called and left a message for the patient to come in on Monday morning to do a urine poc I informed him to ask for [place].   Khianna Blazina,cma

## 2020-02-23 ENCOUNTER — Encounter: Payer: Self-pay | Admitting: Family Medicine

## 2020-02-23 ENCOUNTER — Ambulatory Visit (INDEPENDENT_AMBULATORY_CARE_PROVIDER_SITE_OTHER): Payer: Medicare Other | Admitting: Family Medicine

## 2020-02-23 ENCOUNTER — Other Ambulatory Visit: Payer: Self-pay

## 2020-02-23 DIAGNOSIS — R079 Chest pain, unspecified: Secondary | ICD-10-CM | POA: Diagnosis not present

## 2020-02-23 DIAGNOSIS — N401 Enlarged prostate with lower urinary tract symptoms: Secondary | ICD-10-CM

## 2020-02-23 DIAGNOSIS — N3000 Acute cystitis without hematuria: Secondary | ICD-10-CM | POA: Diagnosis not present

## 2020-02-23 DIAGNOSIS — E1140 Type 2 diabetes mellitus with diabetic neuropathy, unspecified: Secondary | ICD-10-CM | POA: Diagnosis not present

## 2020-02-23 DIAGNOSIS — R3916 Straining to void: Secondary | ICD-10-CM

## 2020-02-23 DIAGNOSIS — M6530 Trigger finger, unspecified finger: Secondary | ICD-10-CM | POA: Insufficient documentation

## 2020-02-23 DIAGNOSIS — N390 Urinary tract infection, site not specified: Secondary | ICD-10-CM | POA: Insufficient documentation

## 2020-02-23 DIAGNOSIS — I10 Essential (primary) hypertension: Secondary | ICD-10-CM | POA: Diagnosis not present

## 2020-02-23 DIAGNOSIS — Z794 Long term (current) use of insulin: Secondary | ICD-10-CM

## 2020-02-23 DIAGNOSIS — M65331 Trigger finger, right middle finger: Secondary | ICD-10-CM

## 2020-02-23 NOTE — Progress Notes (Signed)
Virtual Visit via telephone Note  This visit type was conducted due to national recommendations for restrictions regarding the COVID-19 pandemic (e.g. social distancing).  This format is felt to be most appropriate for this patient at this time.  All issues noted in this document were discussed and addressed.  No physical exam was performed (except for noted visual exam findings with Video Visits).   I connected with Patrick Patel today at  4:00 PM EDT by telephone and verified that I am speaking with the correct person using two identifiers. Location patient: home Location provider: home office Persons participating in the virtual visit: patient, provider  I discussed the limitations, risks, security and privacy concerns of performing an evaluation and management service by telephone and the availability of in person appointments. I also discussed with the patient that there may be a patient responsible charge related to this service. The patient expressed understanding and agreed to proceed.  Interactive audio and video telecommunications were attempted between this provider and patient, however failed, due to patient having technical difficulties OR patient did not have access to video capability.  We continued and completed visit with audio only.   Reason for visit: f/u  HPI: DIABETES Disease Monitoring: Blood Sugar ranges-160s-170s though does get 2-hour postprandials into the 200s and up to 250s. Polyuria/phagia/dipsia-no.      Optho-has deferred this due to the pandemic. Medications: Compliance-taking NPH 22 units in the morning and 18 units at night.  Taking Jardiance and glipizide.  Also on Metformin. Hypoglycemic symptoms-no. Patient has been eating more starches and more candy and cake during the pandemic.  HYPERTENSION  Disease Monitoring  Home BP Monitoring ranges from 127-155/65-74. Chest pain-chronic and actually improved.  Symptoms improved with exertion.  Patient has  declined cardiac work-up with catheterization previously.    Dyspnea-improved. Medications  Compliance-taking chlorthalidone.  Did not start on the amlodipine due to chronic issues with lightheadedness.  UTI: Patient went to urgent care and was diagnosed with a UTI.  He had some dysuria.  They placed him on Macrobid.  He notes this has improved his symptoms.  He does feel like he may be retaining more urine than typical though he is eventually able to get it out.  He sees urology in the next month or 2.  Trigger finger: Patient notes this is right third finger.  If his hand goes into a fist he has trouble extending the third finger.  He has to use his other hand to straighten it.  Occurs 10 to 20% of the time.  Feels like it clicks when he straightens it.     ROS: See pertinent positives and negatives per HPI.  Past Medical History:  Diagnosis Date  . Diabetes ([place])   . Hyperlipidemia   . Hypertension     No past surgical history on file.  Family History  Problem Relation Age of Onset  . Breast cancer Mother   . Thyroid cancer Mother     SOCIAL HX: Former smoker   Current Outpatient Medications:  .  Accu-Chek Softclix Lancets lancets, Use as instructedUSE TO CHECK BLOOD GLUCOSE 3 TIMES DAILY, Disp: 300 each, Rfl: 2 .  acetaminophen (TYLENOL) 500 MG tablet, Take 1,000 mg by mouth 2 (two) times daily at 10 AM and 5 PM. , Disp: , Rfl:  .  alfuzosin (UROXATRAL) 10 MG 24 hr tablet, Take 1 tablet (10 mg total) by mouth daily with breakfast., Disp: 90 tablet, Rfl: 3 .  amLODipine (NORVASC) 2.5  MG tablet, Take 1 tablet (2.5 mg total) by mouth daily., Disp: 90 tablet, Rfl: 3 .  aspirin EC 325 MG tablet, Take 650 mg by mouth daily. , Disp: , Rfl:  .  blood glucose meter kit and supplies KIT, Dispense Accu-Chek Aviva plus. Check once daily as directed. For ICD 10 code E11.9., Disp: 1 each, Rfl: 0 .  Calcium-Magnesium-Zinc 242-35-3 MG TABS, Take 4 tablets by mouth daily., Disp: , Rfl:  .   chlorthalidone (HYGROTON) 50 MG tablet, TAKE 1 TABLET BY MOUTH  DAILY, Disp: 90 tablet, Rfl: 3 .  Cranberry 125 MG TABS, Take 4 tablets by mouth daily., Disp: , Rfl:  .  diphenhydrAMINE (BENADRYL) 25 MG tablet, Take 50 mg by mouth at bedtime as needed for sleep., Disp: , Rfl:  .  glipiZIDE (GLUCOTROL) 10 MG tablet, TAKE 2 TABLETS BY MOUTH TWO TIMES DAILY, Disp: 360 tablet, Rfl: 3 .  glucose blood (ACCU-CHEK AVIVA PLUS) test strip, USE TO CHECK BLOOD GLUCOSE 3 TIMES DAILY, Disp: 300 each, Rfl: 3 .  insulin NPH Human (HUMULIN N) 100 UNIT/ML injection, INJECT SUBCUTANEOUSLY 20  UNITS IN THE MORNING AND 15 UNITS AT BEDTIME, Disp: 40 mL, Rfl: 3 .  Insulin Syringe-Needle U-100 (BD INSULIN SYRINGE U/F) 31G X 5/16" 0.3 ML MISC, USE TWICE DAILY WITH INSULIN, Disp: 200 each, Rfl: 2 .  JARDIANCE 10 MG TABS tablet, TAKE 10 MG BY MOUTH TWICE  DAILY, Disp: 180 tablet, Rfl: 3 .  metFORMIN (GLUCOPHAGE-XR) 500 MG 24 hr tablet, TAKE 4 TABLETS BY MOUTH ONCE DAILY WITH BREAKFAST, Disp: 360 tablet, Rfl: 1 .  Multiple Vitamins-Minerals (MULTIVITAMIN WITH MINERALS) tablet, Take 2 tablets by mouth daily., Disp: , Rfl:  .  Omega-3 Fatty Acids (FISH OIL) 1000 MG CPDR, Take 4 capsules by mouth every other day., Disp: , Rfl:  .  rosuvastatin (CRESTOR) 20 MG tablet, Take 1 tablet (20 mg total) by mouth daily., Disp: 90 tablet, Rfl: 3 .  triamcinolone cream (KENALOG) 0.1 %, APPLY CREAM EXTERNALLY 2  TIMES DAILY AS NEEDED, Disp: 45 g, Rfl: 0  EXAM:  VITALS per patient if applicable:  GENERAL: alert, oriented, appears well and in no acute distress  HEENT: atraumatic, conjunttiva clear, no obvious abnormalities on inspection of external nose and ears  NECK: normal movements of the head and neck  LUNGS: on inspection no signs of respiratory distress, breathing rate appears normal, no obvious gross SOB, gasping or wheezing  CV: no obvious cyanosis  MS: moves all visible extremities without noticeable  abnormality  PSYCH/NEURO: pleasant and cooperative, no obvious depression or anxiety, speech and thought processing grossly intact  ASSESSMENT AND PLAN:  Discussed the following assessment and plan:  UTI (urinary tract infection) He will complete his Macrobid.  If his symptoms worsen or return to let us know.  BPH (benign prostatic hyperplasia) He will continue Uroxatrol.  I encouraged him to follow-up with urology.  Type 2 diabetes mellitus with diabetic neuropathy, unspecified (HCC) Uncontrolled.  We'll continue his current regimen.  We'll have him in for labs.  Essential hypertension Borderline for his age.  He does not want to go on any additional medication.  We'll continue chlorthalidone.  He'll monitor.  Chest pain Chronic issue.  Actually improved to a certain degree.  He'll monitor and if he has any worsening or persistent symptoms seek medical attention.  Trigger finger Discussed seeing orthopedics though he declined.  He'll monitor.  Discussed that there is the potential that it could get stuck if  he does not get it taken care of soon enough.   Orders Placed This Encounter  Procedures  . Comp Met (CMET)    Standing Status:   Future    Standing Expiration Date:   02/22/2021  . Lipid panel    Standing Status:   Future    Standing Expiration Date:   02/22/2021  . Hemoglobin A1c    Standing Status:   Future    Standing Expiration Date:   02/22/2021  . Urine Microalbumin w/creat. ratio    Standing Status:   Future    Standing Expiration Date:   02/22/2021    No orders of the defined types were placed in this encounter.    I discussed the assessment and treatment plan with the patient. The patient was provided an opportunity to ask questions and all were answered. The patient agreed with the plan and demonstrated an understanding of the instructions.   The patient was advised to call back or seek an in-person evaluation if the symptoms worsen or if the condition fails  to improve as anticipated.  I provided 19 minutes of non-face-to-face time during this encounter.   [name], MD

## 2020-02-23 NOTE — Assessment & Plan Note (Signed)
He will continue Uroxatrol.  I encouraged him to follow-up with urology.

## 2020-02-23 NOTE — Assessment & Plan Note (Signed)
Discussed seeing orthopedics though he declined.  He'll monitor.  Discussed that there is the potential that it could get stuck if he does not get it taken care of soon enough.

## 2020-02-23 NOTE — Assessment & Plan Note (Signed)
Borderline for his age.  He does not want to go on any additional medication.  We'll continue chlorthalidone.  He'll monitor.

## 2020-02-23 NOTE — Assessment & Plan Note (Signed)
Uncontrolled.  We'll continue his current regimen.  We'll have him in for labs.

## 2020-02-23 NOTE — Assessment & Plan Note (Signed)
He will complete his Macrobid.  If his symptoms worsen or return to let us know.

## 2020-02-23 NOTE — Assessment & Plan Note (Signed)
Chronic issue.  Actually improved to a certain degree.  He'll monitor and if he has any worsening or persistent symptoms seek medical attention.

## 2020-02-24 NOTE — Telephone Encounter (Signed)
Patient went to urgent care for this issue. Aurielle Slingerland,cma

## 2020-02-25 ENCOUNTER — Ambulatory Visit: Payer: Medicare Other | Admitting: Urology

## 2020-03-10 ENCOUNTER — Other Ambulatory Visit: Payer: Self-pay

## 2020-03-10 ENCOUNTER — Other Ambulatory Visit (INDEPENDENT_AMBULATORY_CARE_PROVIDER_SITE_OTHER): Payer: Medicare Other

## 2020-03-10 DIAGNOSIS — R350 Frequency of micturition: Secondary | ICD-10-CM | POA: Diagnosis not present

## 2020-03-10 DIAGNOSIS — R300 Dysuria: Secondary | ICD-10-CM

## 2020-03-10 DIAGNOSIS — E1140 Type 2 diabetes mellitus with diabetic neuropathy, unspecified: Secondary | ICD-10-CM | POA: Diagnosis not present

## 2020-03-10 DIAGNOSIS — I10 Essential (primary) hypertension: Secondary | ICD-10-CM | POA: Diagnosis not present

## 2020-03-10 DIAGNOSIS — Z794 Long term (current) use of insulin: Secondary | ICD-10-CM | POA: Diagnosis not present

## 2020-03-10 LAB — POCT URINALYSIS DIP (MANUAL ENTRY)
Bilirubin, UA: NEGATIVE
Blood, UA: NEGATIVE
Glucose, UA: >=1000 mg/dL — AB
Ketones, POC UA: NEGATIVE mg/dL
Leukocytes, UA: NEGATIVE
Nitrite, UA: NEGATIVE
Protein Ur, POC: NEGATIVE mg/dL
Spec Grav, UA: 1.015 (ref 1.010–1.025)
Urobilinogen, UA: 0.2 E.U./dL
pH, UA: 5 (ref 5.0–8.0)

## 2020-03-10 NOTE — Addendum Note (Signed)
Addended by: Warden Fillers on: 03/10/2020 04:28 PM   Modules accepted: Orders

## 2020-03-11 ENCOUNTER — Telehealth: Payer: Self-pay | Admitting: Family Medicine

## 2020-03-11 LAB — COMPREHENSIVE METABOLIC PANEL
ALT: 14 U/L (ref 0–53)
AST: 19 U/L (ref 0–37)
Albumin: 4.4 g/dL (ref 3.5–5.2)
Alkaline Phosphatase: 57 U/L (ref 39–117)
BUN: 18 mg/dL (ref 6–23)
CO2: 26 mEq/L (ref 19–32)
Calcium: 9.5 mg/dL (ref 8.4–10.5)
Chloride: 101 mEq/L (ref 96–112)
Creatinine, Ser: 0.85 mg/dL (ref 0.40–1.50)
GFR: 87.28 mL/min (ref >60.00)
Glucose, Bld: 190 mg/dL — ABNORMAL HIGH (ref 70–99)
Potassium: 3.9 mEq/L (ref 3.5–5.1)
Sodium: 138 mEq/L (ref 135–145)
Total Bilirubin: 0.5 mg/dL (ref 0.2–1.2)
Total Protein: 7.5 g/dL (ref 6.0–8.3)

## 2020-03-11 LAB — BASIC METABOLIC PANEL
BUN: 18 mg/dL (ref 6–23)
CO2: 26 mEq/L (ref 19–32)
Calcium: 9.5 mg/dL (ref 8.4–10.5)
Chloride: 101 mEq/L (ref 96–112)
Creatinine, Ser: 0.85 mg/dL (ref 0.40–1.50)
GFR: 87.28 mL/min (ref >60.00)
Glucose, Bld: 190 mg/dL — ABNORMAL HIGH (ref 70–99)
Potassium: 3.9 mEq/L (ref 3.5–5.1)
Sodium: 138 mEq/L (ref 135–145)

## 2020-03-11 LAB — LIPID PANEL
Cholesterol: 107 mg/dL (ref 0–200)
HDL: 31.8 mg/dL — ABNORMAL LOW (ref >39.00)
NonHDL: 75.36
Total CHOL/HDL Ratio: 3
Triglycerides: 307 mg/dL — ABNORMAL HIGH (ref 0.0–149.0)
VLDL: 61.4 mg/dL — ABNORMAL HIGH (ref 0.0–40.0)

## 2020-03-11 LAB — MICROALBUMIN / CREATININE URINE RATIO
Creatinine,U: 50.1 mg/dL
Microalb Creat Ratio: 2.9 mg/g (ref 0.0–30.0)
Microalb, Ur: 1.4 mg/dL (ref 0.0–1.9)

## 2020-03-11 LAB — HEMOGLOBIN A1C: Hgb A1c MFr Bld: 7.2 % — ABNORMAL HIGH (ref 4.6–6.5)

## 2020-03-11 LAB — LDL CHOLESTEROL, DIRECT: Direct LDL: 40 mg/dL

## 2020-03-11 NOTE — Telephone Encounter (Signed)
Left message for patient to call back and schedule Medicare Annual Wellness Visit (AWV) either virtually or audio only.  Last AWV 03/26/17; please schedule at anytime with Denisa O'Brien-Blaney at [facility].

## 2020-03-11 NOTE — Telephone Encounter (Signed)
Pt returned your call. He said he doesn't want a visit.

## 2020-04-06 DIAGNOSIS — E782 Mixed hyperlipidemia: Secondary | ICD-10-CM | POA: Diagnosis not present

## 2020-04-06 DIAGNOSIS — I10 Essential (primary) hypertension: Secondary | ICD-10-CM | POA: Diagnosis not present

## 2020-05-05 ENCOUNTER — Ambulatory Visit: Payer: Medicare Other | Admitting: Urology

## 2020-05-06 DIAGNOSIS — I10 Essential (primary) hypertension: Secondary | ICD-10-CM | POA: Diagnosis not present

## 2020-05-06 DIAGNOSIS — E119 Type 2 diabetes mellitus without complications: Secondary | ICD-10-CM | POA: Diagnosis not present

## 2020-05-25 DIAGNOSIS — I10 Essential (primary) hypertension: Secondary | ICD-10-CM | POA: Diagnosis not present

## 2020-05-25 DIAGNOSIS — E782 Mixed hyperlipidemia: Secondary | ICD-10-CM | POA: Diagnosis not present

## 2020-05-26 ENCOUNTER — Encounter: Payer: Self-pay | Admitting: Family Medicine

## 2020-05-26 ENCOUNTER — Ambulatory Visit (INDEPENDENT_AMBULATORY_CARE_PROVIDER_SITE_OTHER): Payer: Medicare Other

## 2020-05-26 ENCOUNTER — Other Ambulatory Visit: Payer: Self-pay

## 2020-05-26 ENCOUNTER — Ambulatory Visit (INDEPENDENT_AMBULATORY_CARE_PROVIDER_SITE_OTHER): Payer: Medicare Other | Admitting: Family Medicine

## 2020-05-26 VITALS — BP 140/80 | HR 64 | Temp 97.4°F | Ht 68.0 in | Wt 252.4 lb

## 2020-05-26 DIAGNOSIS — M545 Low back pain, unspecified: Secondary | ICD-10-CM

## 2020-05-26 DIAGNOSIS — Z794 Long term (current) use of insulin: Secondary | ICD-10-CM

## 2020-05-26 DIAGNOSIS — R350 Frequency of micturition: Secondary | ICD-10-CM

## 2020-05-26 DIAGNOSIS — R600 Localized edema: Secondary | ICD-10-CM | POA: Diagnosis not present

## 2020-05-26 DIAGNOSIS — S90414A Abrasion, right lesser toe(s), initial encounter: Secondary | ICD-10-CM

## 2020-05-26 DIAGNOSIS — E1140 Type 2 diabetes mellitus with diabetic neuropathy, unspecified: Secondary | ICD-10-CM

## 2020-05-26 DIAGNOSIS — R109 Unspecified abdominal pain: Secondary | ICD-10-CM | POA: Insufficient documentation

## 2020-05-26 DIAGNOSIS — I10 Essential (primary) hypertension: Secondary | ICD-10-CM | POA: Diagnosis not present

## 2020-05-26 DIAGNOSIS — B351 Tinea unguium: Secondary | ICD-10-CM

## 2020-05-26 DIAGNOSIS — R1033 Periumbilical pain: Secondary | ICD-10-CM | POA: Diagnosis not present

## 2020-05-26 DIAGNOSIS — N401 Enlarged prostate with lower urinary tract symptoms: Secondary | ICD-10-CM

## 2020-05-26 DIAGNOSIS — M654 Radial styloid tenosynovitis [de Quervain]: Secondary | ICD-10-CM

## 2020-05-26 DIAGNOSIS — R3916 Straining to void: Secondary | ICD-10-CM

## 2020-05-26 LAB — POCT URINALYSIS DIPSTICK
Bilirubin, UA: NEGATIVE
Blood, UA: NEGATIVE
Glucose, UA: POSITIVE — AB
Ketones, UA: NEGATIVE
Leukocytes, UA: NEGATIVE
Nitrite, UA: NEGATIVE
Protein, UA: NEGATIVE
Spec Grav, UA: 1.015 (ref 1.010–1.025)
Urobilinogen, UA: 0.2 E.U./dL
pH, UA: 5.5 (ref 5.0–8.0)

## 2020-05-26 NOTE — Assessment & Plan Note (Signed)
Refer to podiatry

## 2020-05-26 NOTE — Assessment & Plan Note (Signed)
Adequate control for age.  He does not want any additional medication anyways as he has gotten lightheaded in the past with higher doses of medication.  He will continue chlorthalidone at this time.

## 2020-05-26 NOTE — Patient Instructions (Signed)
Nice to see you. Please monitor the cut on your toe.  If it worsens or appears infected please be evaluated immediately. I have referred you to podiatry. I have referred you to ophthalmology. We will get an x-ray of your back today. We will get an ultrasound of your abdomen as well.  If you develop persistent or worsening abdominal pain please seek medical attention immediately.

## 2020-05-26 NOTE — Assessment & Plan Note (Signed)
Seems to be uncontrolled though prior A1c was decent control for his age.  He will continue his current regimen for now.  Plan for an A1c at his next visit.  Refer to ophthalmology.  Podiatry referral as well.

## 2020-05-26 NOTE — Progress Notes (Signed)
Patrick Rumps, MD Phone: [phone]  Patrick Patel is a 79 y.o. male who presents today for f/u.  DIABETES Disease Monitoring: Blood Sugar ranges-140-185 Polyuria/phagia/dipsia- some occasional polydipsia      Optho- due Medications: Compliance- taking glipizide, NPH 22 am 17 pm, jardiance, metformin Hypoglycemic symptoms- no  Hypertension: Not checking blood pressures.  Only taking chlorthalidone.  Does note some feet swelling though notes he forgot his medications yesterday and the swelling went down significantly.  History of UTI: Patient notes very minimal discomfort at times.  He notes he wants his urine checked every time he comes in to make sure he does not have a UTI.  Periumbilical abdominal pain: This has been going on intermittently for 3 to 4 months.  Has been taking Maalox with some benefit.  He notes diarrhea alternating with constipation that is a chronic issue.  No blood in stool.  No nausea.  No vomiting.  Left low back pain: Patient notes this started after falling off a curb 2 months ago.  He did not see the curb and fell.  He injured his right wrist though notes that has improved.  He notes the back pain is not incapacitating.  No radiation.  Left wrist pain: This has been going on for some time.  It occurs over the radial aspect of his wrist.   Social History   Tobacco Use  Smoking Status Former Smoker  Smokeless Tobacco Current User  . Types: Snuff     ROS see history of present illness  Objective  Physical Exam Vitals:   05/26/20 1536  BP: 140/80  Pulse: 64  Temp: (!) 97.4 F (36.3 C)  SpO2: 97%    BP Readings from Last 3 Encounters:  05/26/20 140/80  11/19/19 129/68  05/21/19 125/62   Wt Readings from Last 3 Encounters:  05/26/20 252 lb 6.4 oz (114.5 kg)  02/23/20 246 lb (111.6 kg)  11/19/19 255 lb (115.7 kg)    Physical Exam Constitutional:      General: He is not in acute distress.    Appearance: He is not diaphoretic.    Cardiovascular:     Rate and Rhythm: Normal rate and regular rhythm.     Heart sounds: Normal heart sounds.  Pulmonary:     Effort: Pulmonary effort is normal.     Breath sounds: Normal breath sounds.  Abdominal:     General: Bowel sounds are normal. There is no distension.     Palpations: Abdomen is soft.     Tenderness: There is no guarding or rebound.     Comments: Mild soreness around his umbilicus on palpation  Musculoskeletal:     Comments: No midline spine tenderness, no midline spine step-off, there is left lumbar muscular back tenderness, no overlying skin changes, left wrist tender over the radial aspect, no anatomic snuffbox tenderness, positive Finkelstein's  Skin:    General: Skin is warm and dry.  Neurological:     Mental Status: He is alert.    Diabetic Foot Exam - Simple   Simple Foot Form Diabetic Foot exam was performed with the following findings: Yes 05/26/2020  4:11 PM  Visual Inspection See comments: Yes Sensation Testing See comments: Yes Pulse Check Posterior Tibialis and Dorsalis pulse intact bilaterally: Yes Comments Small abrasion on the dorsum of his right fourth toe, no surrounding erythema, no tenderness, onychomycosis throughout, dirt on multiple areas of his feet, intact light touch sensation though decreased monofilament testing throughout  Assessment/Plan: Please see individual problem list.  Essential hypertension Adequate control for age.  He does not want any additional medication anyways as he has gotten lightheaded in the past with higher doses of medication.  He will continue chlorthalidone at this time.  Type 2 diabetes mellitus with diabetic neuropathy, unspecified ([place]) Seems to be uncontrolled though prior A1c was decent control for his age.  He will continue his current regimen for now.  Plan for an A1c at his next visit.  Refer to ophthalmology.  Podiatry referral as well.  Abrasion of toe of right foot Counseled that he  needs to monitor this very closely and if it is not healing and needs to let us know.  If he develops any signs of infection he needs to be reevaluated immediately.  He is aware of this.  Onychomycosis Refer to podiatry.  BPH (benign prostatic hyperplasia) I suspect this is contributing to some of his urinary symptoms.  UA negative for UTI.  He will monitor for now.  Could consider having him see urology again in the future.  Low back pain X-ray today.  Pedal edema Undetermined cause.  Patient feels as though this may be related to his Vania Rea though this is not a listed side effect.  We will check lab work to evaluate for other potential causes.  De Quervain's tenosynovitis, left Discussed icing appropriately.  If not improving he will let us know.  Abdominal pain This has been an ongoing issue.  Undetermined cause.  Discussed seeing GI versus CT scan that the patient was hesitant to do either of those and opted for an ultrasound.  We will also check liver function and a WBC to evaluate for signs of infection or liver dysfunction.   Health Maintenance: Patient reports he got his COVID-19 vaccine already.  Orders Placed This Encounter  Procedures  . DG Lumbar Spine Complete    Standing Status:   Future    Number of Occurrences:   1    Standing Expiration Date:   05/26/2021    Order Specific Question:   Reason for Exam (SYMPTOM  OR DIAGNOSIS REQUIRED)    Answer:   left low back pain since a fall about 2 months ago    Order Specific Question:   Preferred imaging location?    Answer:   [facility] Specific Question:   Radiology Contrast Protocol - do NOT remove file path    Answer:   [email or web address]  . US Abdomen Complete    Standing Status:   Future    Standing Expiration Date:   05/26/2021    Order Specific Question:   Reason for Exam (SYMPTOM  OR DIAGNOSIS REQUIRED)    Answer:   periumbilical pain for 4-5  monhts, alternating constipation and diarrhea    Order Specific Question:   Preferred imaging location?    Answer:    [facility]  . Comp Met (CMET)  . TSH  . CBC with Differential/Platelet  . Ambulatory referral to Podiatry    Referral Priority:   Routine    Referral Type:   Consultation    Referral Reason:   Specialty Services Required    Requested Specialty:   Podiatry    Number of Visits Requested:   1  . Ambulatory referral to Ophthalmology    Referral Priority:   Routine    Referral Type:   Consultation    Referral Reason:   Specialty Services Required    Requested Specialty:  Ophthalmology    Number of Visits Requested:   1  . POCT Urinalysis Dipstick    No orders of the defined types were placed in this encounter.   This visit occurred during the SARS-CoV-2 public health emergency.  Safety protocols were in place, including screening questions prior to the visit, additional usage of staff PPE, and extensive cleaning of exam room while observing appropriate contact time as indicated for disinfecting solutions.   I have spent 40 minutes in the care of this patient regarding history taking, exam, counseling and discussion of plan, placing orders, and documentation.   Patrick Rumps, MD [place]

## 2020-05-26 NOTE — Assessment & Plan Note (Signed)
Discussed icing appropriately.  If not improving he will let us know.

## 2020-05-26 NOTE — Assessment & Plan Note (Signed)
Counseled that he needs to monitor this very closely and if it is not healing and needs to let us know.  If he develops any signs of infection he needs to be reevaluated immediately.  He is aware of this.

## 2020-05-26 NOTE — Assessment & Plan Note (Signed)
Undetermined cause.  Patient feels as though this may be related to his [place] though this is not a listed side effect.  We will check lab work to evaluate for other potential causes.

## 2020-05-26 NOTE — Assessment & Plan Note (Signed)
I suspect this is contributing to some of his urinary symptoms.  UA negative for UTI.  He will monitor for now.  Could consider having him see urology again in the future.

## 2020-05-26 NOTE — Assessment & Plan Note (Signed)
This has been an ongoing issue.  Undetermined cause.  Discussed seeing GI versus CT scan that the patient was hesitant to do either of those and opted for an ultrasound.  We will also check liver function and a WBC to evaluate for signs of infection or liver dysfunction.

## 2020-05-26 NOTE — Assessment & Plan Note (Signed)
X-ray today

## 2020-05-27 ENCOUNTER — Telehealth: Payer: Self-pay

## 2020-05-27 ENCOUNTER — Telehealth: Payer: Self-pay | Admitting: Family Medicine

## 2020-05-27 ENCOUNTER — Encounter: Payer: Self-pay | Admitting: Family Medicine

## 2020-05-27 LAB — COMPREHENSIVE METABOLIC PANEL
ALT: 16 U/L (ref 0–53)
AST: 19 U/L (ref 0–37)
Albumin: 4.4 g/dL (ref 3.5–5.2)
Alkaline Phosphatase: 65 U/L (ref 39–117)
BUN: 22 mg/dL (ref 6–23)
CO2: 25 mEq/L (ref 19–32)
Calcium: 9.5 mg/dL (ref 8.4–10.5)
Chloride: 100 mEq/L (ref 96–112)
Creatinine, Ser: 0.97 mg/dL (ref 0.40–1.50)
GFR: 74.9 mL/min (ref >60.00)
Glucose, Bld: 230 mg/dL — ABNORMAL HIGH (ref 70–99)
Potassium: 3.7 mEq/L (ref 3.5–5.1)
Sodium: 140 mEq/L (ref 135–145)
Total Bilirubin: 0.5 mg/dL (ref 0.2–1.2)
Total Protein: 7.4 g/dL (ref 6.0–8.3)

## 2020-05-27 LAB — CBC WITH DIFFERENTIAL/PLATELET
Basophils Absolute: 0.1 10*3/uL (ref 0.0–0.1)
Basophils Relative: 0.7 % (ref 0.0–3.0)
Eosinophils Absolute: 0.5 10*3/uL (ref 0.0–0.7)
Eosinophils Relative: 5.6 % — ABNORMAL HIGH (ref 0.0–5.0)
HCT: 45.1 % (ref 39.0–52.0)
Hemoglobin: 15.8 g/dL (ref 13.0–17.0)
Lymphocytes Relative: 21.9 % (ref 12.0–46.0)
Lymphs Abs: 1.9 10*3/uL (ref 0.7–4.0)
MCHC: 35 g/dL (ref 30.0–36.0)
MCV: 90.4 fl (ref 78.0–100.0)
Monocytes Absolute: 0.9 10*3/uL (ref 0.1–1.0)
Monocytes Relative: 10.6 % (ref 3.0–12.0)
Neutro Abs: 5.4 10*3/uL (ref 1.4–7.7)
Neutrophils Relative %: 61.2 % (ref 43.0–77.0)
Platelets: 203 10*3/uL (ref 150.0–400.0)
RBC: 4.99 Mil/uL (ref 4.22–5.81)
RDW: 14 % (ref 11.5–15.5)
WBC: 8.8 10*3/uL (ref 4.0–10.5)

## 2020-05-27 LAB — TSH: TSH: 3.43 u[IU]/mL (ref 0.35–4.50)

## 2020-05-27 NOTE — Telephone Encounter (Signed)
Left vm for pt to call ofc about [place].

## 2020-05-27 NOTE — Telephone Encounter (Signed)
Pt was returning call 

## 2020-05-27 NOTE — Telephone Encounter (Signed)
-----   Message from Glori Luis, MD sent at 05/27/2020  2:37 PM EDT ----- Please let the patient know that his labs did not reveal a cause for his swelling and did not reveal any signs of infection. His legs are likely swelling related to venous insufficiency. The treatment for that is propping his legs up and compression stockings.

## 2020-05-28 ENCOUNTER — Telehealth: Payer: Self-pay | Admitting: Family Medicine

## 2020-05-28 DIAGNOSIS — M545 Low back pain, unspecified: Secondary | ICD-10-CM

## 2020-05-28 DIAGNOSIS — R1033 Periumbilical pain: Secondary | ICD-10-CM

## 2020-05-28 NOTE — Telephone Encounter (Signed)
Pt wanted [place] ab cancelled due to wanting a afternoon appt. I explained to pt that it has be in the morning pt said to cancel and hung up.

## 2020-05-31 ENCOUNTER — Telehealth: Payer: Self-pay

## 2020-05-31 NOTE — Telephone Encounter (Signed)
Noted. Please see if the patient would be willing to do a CT scan to evaluate his pain. Thanks.

## 2020-05-31 NOTE — Telephone Encounter (Signed)
LVM for the patient to call back. Azzam Mehra,cma  

## 2020-05-31 NOTE — Telephone Encounter (Signed)
Patient called back and stated he would do the Ct scan but it has to be after 3:30 pm and he would like his back done with the abdomen.   Markeya Mincy,cma

## 2020-06-01 ENCOUNTER — Telehealth: Payer: Self-pay | Admitting: Family Medicine

## 2020-06-01 NOTE — Telephone Encounter (Signed)
LM " [facility] [place] said 5 days ago  "LM" [facility] [place] said 1 day ago  "Rejection Reason - Patient did not respond" [facility] [place] said about 4 hours ago  msg sent to provider  lft vm to call [facility] to sch

## 2020-06-01 NOTE — Telephone Encounter (Signed)
Noted.  Will discuss with patient at a future visit.

## 2020-06-01 NOTE — Telephone Encounter (Signed)
Orders placed. He can have the x-ray at any time at the hospital. He does not need an appointment for that. Marijo Conception will contact him to schedule the CT scan.

## 2020-06-01 NOTE — Telephone Encounter (Signed)
lft vm for pt to call ofc to sch CT. 

## 2020-06-01 NOTE — Telephone Encounter (Signed)
I called and received a busy signal to inform the patient that he can walk-in for a xray of his back at [facility] hospital anytime.  Daiya Tamer,cma

## 2020-06-04 NOTE — Telephone Encounter (Signed)
I spoke with the patienit and he stated he does not want the xray because he has a MRI of his back scheduled for July 19th.  Addi Pak,cma

## 2020-06-04 NOTE — Telephone Encounter (Signed)
I called and LVM for the patient to go to [facility] in the [facility] for an xray of his back and I stated he could call back with any questions.  Elieser Tetrick,cma

## 2020-06-08 ENCOUNTER — Ambulatory Visit: Payer: Medicare Other

## 2020-06-15 ENCOUNTER — Ambulatory Visit: Admission: RE | Admit: 2020-06-15 | Payer: Medicare Other | Source: Ambulatory Visit

## 2020-06-16 ENCOUNTER — Ambulatory Visit
Admission: RE | Admit: 2020-06-16 | Discharge: 2020-06-16 | Disposition: A | Discharge status: Home / Self Care | Payer: Medicare Other | Source: Ambulatory Visit | Attending: Family Medicine | Admitting: Family Medicine

## 2020-06-16 ENCOUNTER — Other Ambulatory Visit: Payer: Self-pay

## 2020-06-16 DIAGNOSIS — K429 Umbilical hernia without obstruction or gangrene: Secondary | ICD-10-CM | POA: Diagnosis not present

## 2020-06-16 DIAGNOSIS — R1033 Periumbilical pain: Secondary | ICD-10-CM | POA: Insufficient documentation

## 2020-06-16 DIAGNOSIS — I700 Atherosclerosis of aorta: Secondary | ICD-10-CM | POA: Diagnosis not present

## 2020-06-16 DIAGNOSIS — N3289 Other specified disorders of bladder: Secondary | ICD-10-CM | POA: Diagnosis not present

## 2020-06-16 MED ORDER — IOHEXOL 300 MG/ML  SOLN
125.0000 mL | Freq: Once | INTRAMUSCULAR | Status: AC | PRN
  Administered 2020-06-16: 125 mL via INTRAVENOUS

## 2020-06-18 ENCOUNTER — Other Ambulatory Visit: Payer: Self-pay | Admitting: Family Medicine

## 2020-06-18 DIAGNOSIS — K429 Umbilical hernia without obstruction or gangrene: Secondary | ICD-10-CM

## 2020-06-23 ENCOUNTER — Encounter: Payer: Self-pay | Admitting: Podiatry

## 2020-06-23 ENCOUNTER — Ambulatory Visit (INDEPENDENT_AMBULATORY_CARE_PROVIDER_SITE_OTHER): Payer: Medicare Other | Admitting: Podiatry

## 2020-06-23 ENCOUNTER — Other Ambulatory Visit: Payer: Self-pay

## 2020-06-23 DIAGNOSIS — E1142 Type 2 diabetes mellitus with diabetic polyneuropathy: Secondary | ICD-10-CM | POA: Diagnosis not present

## 2020-06-23 DIAGNOSIS — M6740 Ganglion, unspecified site: Secondary | ICD-10-CM | POA: Diagnosis not present

## 2020-06-23 NOTE — Progress Notes (Signed)
He presents today after recent being referred by Dr. Birdie Sons for small wound to the distal dorsal aspect of the third toe right foot times the past 3 weeks.  Patient states that his primary care provider is concerned about a wound that may possibly result in amputation of the toe itself.  Patient states that he has multiple of these small lesions either on the toes or fingers throughout the year that blister up pop and drain and then healed.  ROS: Denies fever chills nausea vomiting muscle aches pains calf pain back pain chest pain shortness of breath.  Objective: Pulses are strongly palpable bilateral.  Venous stasis with hemosiderin deposition resulted in hyperpigmentation of the legs no open lesions or wounds are noted.  Wound to the distal dorsal aspect of the third toe right foot demonstrates a healing scab.  This appears to be a mucoid cyst that has ruptured.  It is approximately 2 mm proximal to the proximal nail fold.  No signs of purulence no signs of infection.  Assessment: Mucoid cyst third toe right foot.  Diabetic peripheral neuropathy.  Plan: Discussed etiology pathology conservative surgical therapies.  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

## 2020-06-28 ENCOUNTER — Other Ambulatory Visit: Payer: Self-pay | Admitting: Family Medicine

## 2020-07-06 DIAGNOSIS — I10 Essential (primary) hypertension: Secondary | ICD-10-CM | POA: Diagnosis not present

## 2020-07-06 DIAGNOSIS — E782 Mixed hyperlipidemia: Secondary | ICD-10-CM | POA: Diagnosis not present

## 2020-07-06 DIAGNOSIS — E119 Type 2 diabetes mellitus without complications: Secondary | ICD-10-CM | POA: Diagnosis not present

## 2020-07-28 ENCOUNTER — Ambulatory Visit: Payer: Medicare Other | Admitting: Orthotics

## 2020-08-09 DIAGNOSIS — I10 Essential (primary) hypertension: Secondary | ICD-10-CM | POA: Diagnosis not present

## 2020-08-09 DIAGNOSIS — E119 Type 2 diabetes mellitus without complications: Secondary | ICD-10-CM | POA: Diagnosis not present

## 2020-08-27 ENCOUNTER — Ambulatory Visit (INDEPENDENT_AMBULATORY_CARE_PROVIDER_SITE_OTHER): Payer: Medicare Other | Admitting: Family Medicine

## 2020-08-27 ENCOUNTER — Other Ambulatory Visit: Payer: Self-pay

## 2020-08-27 ENCOUNTER — Encounter: Payer: Self-pay | Admitting: Family Medicine

## 2020-08-27 VITALS — BP 140/70 | HR 95 | Temp 98.2°F | Resp 17 | Wt 250.5 lb

## 2020-08-27 DIAGNOSIS — R5382 Chronic fatigue, unspecified: Secondary | ICD-10-CM

## 2020-08-27 DIAGNOSIS — R079 Chest pain, unspecified: Secondary | ICD-10-CM

## 2020-08-27 DIAGNOSIS — R1033 Periumbilical pain: Secondary | ICD-10-CM | POA: Diagnosis not present

## 2020-08-27 DIAGNOSIS — Z794 Long term (current) use of insulin: Secondary | ICD-10-CM | POA: Diagnosis not present

## 2020-08-27 DIAGNOSIS — E1140 Type 2 diabetes mellitus with diabetic neuropathy, unspecified: Secondary | ICD-10-CM | POA: Diagnosis not present

## 2020-08-27 NOTE — Patient Instructions (Signed)
Nice to see you. We will get lab work today and contact you with the results. Somebody will contact you to schedule your echo as well.

## 2020-08-27 NOTE — Progress Notes (Signed)
[name], MD Phone: [phone]  Patrick Patel is a 79 y.o. male who presents today for follow-up.  Diabetes: Typically 170-190.  Taking Jardiance, Metformin, glipizide, NPH 25 units in the morning and 15 units at night.  No polyuria or polydipsia.  No hypoglycemia.  Umbilical hernia: Patient underwent CT scan for general abdominal discomfort.  Found to have an umbilical hernia though notes he has no significant pain at his umbilicus.  Notes good bowel movements daily.  Chronic chest pain: Patient notes occasionally he is having this when he is laying down.  Last about 10 minutes and resolves if he continues to lay down.  His exertional chest pain has become less pronounced.  He notes some trouble breathing initially with chest pain when he lays down though no PND.  Does have a history of reflux though states this feels different.  No radiation of the pain.  He does have chronic edema.  He has declined interventional work-up and treatment for prior chest pain and continues to do so.  Fatigue: Patient notes he has slowed down over a number of months.  He feels as though he wants to sleep more.  He does nap during the day.  He does wake up well rested.  He wonders if he had COVID-19 earlier in the year as he lost his sense of taste and smell.  He has been vaccinated against COVID-19 with the moderna vaccine.  Social History   Tobacco Use  Smoking Status Former Smoker  Smokeless Tobacco Architectural technologist  . Types: Snuff     ROS see history of present illness  Objective  Physical Exam Vitals:   08/27/20 1640  BP: 140/70  Pulse: 95  Resp: 17  Temp: 98.2 F (36.8 C)  SpO2: 95%    BP Readings from Last 3 Encounters:  08/27/20 140/70  05/26/20 140/80  11/19/19 129/68   Wt Readings from Last 3 Encounters:  08/27/20 250 lb 8 oz (113.6 kg)  05/26/20 252 lb 6.4 oz (114.5 kg)  02/23/20 246 lb (111.6 kg)    Physical Exam Constitutional:      General: He is not in acute  distress.    Appearance: He is not diaphoretic.  Cardiovascular:     Rate and Rhythm: Normal rate and regular rhythm.     Heart sounds: Normal heart sounds.  Pulmonary:     Effort: Pulmonary effort is normal.     Breath sounds: Normal breath sounds.  Abdominal:     General: Bowel sounds are normal. There is no distension.     Palpations: Abdomen is soft.     Tenderness: There is no abdominal tenderness. There is no guarding or rebound.  Musculoskeletal:     Right lower leg: No edema.     Left lower leg: No edema.  Skin:    General: Skin is warm and dry.  Neurological:     Mental Status: He is alert.      Assessment/Plan: Please see individual problem list.  Type 2 diabetes mellitus with diabetic neuropathy, unspecified (HCC) Uncontrolled.  Check A1c.  He will continue Jardiance 10 mg once daily, glipizide 20 mg twice daily, NPH insulin 25 units in the morning and 15 units in the evening, and Metformin 2000 mg once daily.  Abdominal pain Possibly related to his umbilical hernia though there is no evidence of incarceration or strangulation.  Offered referral to general surgery to consider repair though he declines this.  Given hernia return precautions.  He will  monitor.  Chest pain Chronic issue.  Has been having some discomfort and shortness of breath when laying down.  Discussed interventional work-up that would occur through cardiology and the patient declines having any interventional evaluation for procedural treatment.  He is willing to have an echo completed.  Order placed for this.  Advised of reasons to seek medical attention regarding chest pain.  Chronic fatigue Could be related to cardiac cause.  We will obtain an echo for that.  Lab work as outlined below to look for other causes.  Patient was curious if he had Covid antibodies given his loss of taste and smell and thus will check Covid IgG antibody.    Orders Placed This Encounter  Procedures  . Comp Met (CMET)    . CBC w/Diff  . TSH  . SAR CoV2 Serology (COVID 19)AB(IGG)IA    Order Specific Question:   Is this test for diagnosis or screening    Answer:   Screening    Order Specific Question:   Symptomatic for COVID-19 as defined by CDC    Answer:   No    Order Specific Question:   Hospitalized for COVID-19    Answer:   No    Order Specific Question:   Admitted to ICU for COVID-19    Answer:   No    Order Specific Question:   Previously tested for COVID-19    Answer:   No    Order Specific Question:   Resident in a congregate (group) care setting    Answer:   No    Order Specific Question:   Employed in healthcare setting    Answer:   No  . B12  . Vitamin D (25 hydroxy)  . HgB A1c  . SPECIMEN COMPROMISED  . SARS-CoV-2 Antibody(IgG)Spike,Semi-Quantitative  . ECHOCARDIOGRAM COMPLETE    Standing Status:   Future    Standing Expiration Date:   08/31/2021    Order Specific Question:   Where should this test be performed    Answer:   [facility]    Order Specific Question:   Please indicate who you request to read the echo results.    Answer:   [facility]    Order Specific Question:   Perflutren DEFINITY (image enhancing agent) should be administered unless hypersensitivity or allergy exist    Answer:   Administer Perflutren    Order Specific Question:   Is a special reader required? (athlete or structural heart)    Answer:   No    Order Specific Question:   Does this study need to be read by the Structural team/Level 3 readers?    Answer:   No    Order Specific Question:   Reason for exam-Echo    Answer:   Chest Pain  786.50 / R07.9    No orders of the defined types were placed in this encounter.   Patrick Patel was seen today for follow-up.  Diagnoses and all orders for this visit:  Chronic fatigue -     Comp Met (CMET) -     CBC w/Diff -     TSH -     SAR CoV2 Serology (COVID 19)AB(IGG)IA -     B12 -     Vitamin D (25 hydroxy)  Type 2 diabetes mellitus with diabetic  neuropathy, with long-term current use of insulin (HCC) -     HgB U4Q  Periumbilical abdominal pain  Chest pain, unspecified type -     ECHOCARDIOGRAM COMPLETE;  Future  Other orders -     SPECIMEN COMPROMISED -     SARS-CoV-2 Antibody(IgG)Spike,Semi-Quantitative     This visit occurred during the SARS-CoV-2 public health emergency.  Safety protocols were in place, including screening questions prior to the visit, additional usage of staff PPE, and extensive cleaning of exam room while observing appropriate contact time as indicated for disinfecting solutions.    [name], MD [place]

## 2020-08-30 LAB — COMPREHENSIVE METABOLIC PANEL
AG Ratio: 1.4 (calc) (ref 1.0–2.5)
ALT: 14 U/L (ref 9–46)
AST: 18 U/L (ref 10–35)
Albumin: 4.3 g/dL (ref 3.6–5.1)
Alkaline phosphatase (APISO): 73 U/L (ref 35–144)
BUN: 22 mg/dL (ref 7–25)
CO2: 24 mmol/L (ref 20–32)
Calcium: 10.7 mg/dL — ABNORMAL HIGH (ref 8.6–10.3)
Chloride: 98 mmol/L (ref 98–110)
Creat: 1 mg/dL (ref 0.70–1.18)
Globulin: 3.1 g/dL (calc) (ref 1.9–3.7)
Glucose, Bld: 141 mg/dL — ABNORMAL HIGH (ref 65–99)
Potassium: 3.6 mmol/L (ref 3.5–5.3)
Sodium: 141 mmol/L (ref 135–146)
Total Bilirubin: 0.6 mg/dL (ref 0.2–1.2)
Total Protein: 7.4 g/dL (ref 6.1–8.1)

## 2020-08-30 LAB — CBC WITH DIFFERENTIAL/PLATELET
Absolute Monocytes: 919 cells/uL (ref 200–950)
Basophils Absolute: 51 cells/uL (ref 0–200)
Basophils Relative: 0.5 %
Eosinophils Absolute: 495 cells/uL (ref 15–500)
Eosinophils Relative: 4.9 %
HCT: 46.5 % (ref 38.5–50.0)
Hemoglobin: 16.6 g/dL (ref 13.2–17.1)
Lymphs Abs: 2293 cells/uL (ref 850–3900)
MCH: 31.7 pg (ref 27.0–33.0)
MCHC: 35.7 g/dL (ref 32.0–36.0)
MCV: 88.9 fL (ref 80.0–100.0)
MPV: 11.5 fL (ref 7.5–12.5)
Monocytes Relative: 9.1 %
Neutro Abs: 6343 cells/uL (ref 1500–7800)
Neutrophils Relative %: 62.8 %
Platelets: 229 10*3/uL (ref 140–400)
RBC: 5.23 10*6/uL (ref 4.20–5.80)
RDW: 13.1 % (ref 11.0–15.0)
Total Lymphocyte: 22.7 %
WBC: 10.1 10*3/uL (ref 3.8–10.8)

## 2020-08-30 LAB — HEMOGLOBIN A1C
Hgb A1c MFr Bld: 7.4 % of total Hgb — ABNORMAL HIGH (ref <5.7)
Mean Plasma Glucose: 166 (calc)
eAG (mmol/L): 9.2 (calc)

## 2020-08-30 LAB — VITAMIN D 25 HYDROXY (VIT D DEFICIENCY, FRACTURES): Vit D, 25-Hydroxy: 20 ng/mL — ABNORMAL LOW (ref 30–100)

## 2020-08-30 LAB — TSH: TSH: 3.73 mIU/L (ref 0.40–4.50)

## 2020-08-30 LAB — SARS-COV-2 ANTIBODY(IGG)SPIKE,SEMI-QUANTITATIVE: SARS COV1 AB(IGG)SPIKE,SEMI QN: 7.52 index — ABNORMAL HIGH (ref <1.00)

## 2020-08-30 LAB — VITAMIN B12: Vitamin B-12: 445 pg/mL (ref 200–1100)

## 2020-08-30 LAB — SPECIMEN COMPROMISED

## 2020-08-31 DIAGNOSIS — R5382 Chronic fatigue, unspecified: Secondary | ICD-10-CM | POA: Insufficient documentation

## 2020-08-31 NOTE — Assessment & Plan Note (Signed)
Possibly related to his umbilical hernia though there is no evidence of incarceration or strangulation.  Offered referral to general surgery to consider repair though he declines this.  Given hernia return precautions.  He will monitor.

## 2020-08-31 NOTE — Assessment & Plan Note (Signed)
Chronic issue.  Has been having some discomfort and shortness of breath when laying down.  Discussed interventional work-up that would occur through cardiology and the patient declines having any interventional evaluation for procedural treatment.  He is willing to have an echo completed.  Order placed for this.  Advised of reasons to seek medical attention regarding chest pain.

## 2020-08-31 NOTE — Assessment & Plan Note (Signed)
Uncontrolled.  Check A1c.  He will continue Jardiance 10 mg once daily, glipizide 20 mg twice daily, NPH insulin 25 units in the morning and 15 units in the evening, and Metformin 2000 mg once daily.

## 2020-08-31 NOTE — Assessment & Plan Note (Signed)
Could be related to cardiac cause.  We will obtain an echo for that.  Lab work as outlined below to look for other causes.  Patient was curious if he had Covid antibodies given his loss of taste and smell and thus will check Covid IgG antibody.

## 2020-09-01 ENCOUNTER — Other Ambulatory Visit: Payer: Self-pay | Admitting: Family Medicine

## 2020-09-01 MED ORDER — VITAMIN D (ERGOCALCIFEROL) 1.25 MG (50000 UNIT) PO CAPS: 50000.0000 [IU] | ORAL_CAPSULE | ORAL | 0 refills | Status: DC

## 2020-09-03 ENCOUNTER — Telehealth: Payer: Self-pay | Admitting: *Deleted

## 2020-09-03 NOTE — Telephone Encounter (Signed)
-----   Message from Glori Luis, MD sent at 09/01/2020 12:45 PM EDT ----- Please let the patient know his calcium is mildly elevated.  I would like to recheck that in the next week or 2.  Order placed.  His vitamin D is low.  That may be contributing to his chronic fatigue.  I sent in a vitamin D supplement for him to start on.  We should recheck this in 8 weeks.  His A1c is adequately controlled for his age.  It appears that they changed his Covid antibody order to test for the spike antibody which occurs after somebody has the vaccine.  He does have antibodies for that though they did not check for regular Covid antibodies despite me ordering it with his labs.

## 2020-09-03 NOTE — Telephone Encounter (Signed)
Spoke with patient see lab.

## 2020-09-11 NOTE — Telephone Encounter (Signed)
Patient called back and informed about message.  Vasili Fok,cma

## 2020-09-16 ENCOUNTER — Other Ambulatory Visit: Payer: Medicare Other

## 2020-09-21 ENCOUNTER — Other Ambulatory Visit: Payer: Self-pay | Admitting: Family Medicine

## 2020-09-23 ENCOUNTER — Other Ambulatory Visit: Payer: Self-pay

## 2020-09-23 ENCOUNTER — Other Ambulatory Visit (INDEPENDENT_AMBULATORY_CARE_PROVIDER_SITE_OTHER): Payer: Medicare Other

## 2020-09-27 LAB — PTH, INTACT AND CALCIUM
Calcium: 9.9 mg/dL (ref 8.6–10.3)
PTH: 22 pg/mL (ref 14–64)

## 2020-10-11 ENCOUNTER — Telehealth: Payer: Self-pay | Admitting: Family Medicine

## 2020-10-11 NOTE — Telephone Encounter (Signed)
lft vm for pt to call ofc to sch echo  

## 2020-10-22 ENCOUNTER — Other Ambulatory Visit: Payer: Self-pay | Admitting: Family Medicine

## 2020-10-29 DIAGNOSIS — Z23 Encounter for immunization: Secondary | ICD-10-CM | POA: Diagnosis not present

## 2020-10-29 DIAGNOSIS — E119 Type 2 diabetes mellitus without complications: Secondary | ICD-10-CM | POA: Diagnosis not present

## 2020-10-29 DIAGNOSIS — I10 Essential (primary) hypertension: Secondary | ICD-10-CM | POA: Diagnosis not present

## 2020-10-31 ENCOUNTER — Other Ambulatory Visit: Payer: Self-pay | Admitting: Family Medicine

## 2020-11-26 ENCOUNTER — Other Ambulatory Visit: Payer: Self-pay

## 2020-11-26 ENCOUNTER — Telehealth (INDEPENDENT_AMBULATORY_CARE_PROVIDER_SITE_OTHER): Payer: Medicare Other | Admitting: Family Medicine

## 2020-11-26 VITALS — Ht 68.0 in | Wt 252.0 lb

## 2020-11-26 DIAGNOSIS — E1140 Type 2 diabetes mellitus with diabetic neuropathy, unspecified: Secondary | ICD-10-CM | POA: Diagnosis not present

## 2020-11-26 DIAGNOSIS — E559 Vitamin D deficiency, unspecified: Secondary | ICD-10-CM | POA: Diagnosis not present

## 2020-11-26 DIAGNOSIS — Z794 Long term (current) use of insulin: Secondary | ICD-10-CM

## 2020-11-26 DIAGNOSIS — I878 Other specified disorders of veins: Secondary | ICD-10-CM | POA: Diagnosis not present

## 2020-11-26 DIAGNOSIS — R5382 Chronic fatigue, unspecified: Secondary | ICD-10-CM | POA: Diagnosis not present

## 2020-11-26 DIAGNOSIS — G4710 Hypersomnia, unspecified: Secondary | ICD-10-CM

## 2020-11-26 NOTE — Assessment & Plan Note (Signed)
Poorly controlled.  We will have him in for an A1c.  He will continue glipizide 20 mg twice daily, Jardiance 10 mg twice daily, insulin NPH 20 units in the morning and 15 units in the evening, and Metformin 2000 mg daily.

## 2020-11-26 NOTE — Assessment & Plan Note (Signed)
Patient reports improvement in this.  He will continue to monitor and elevate his legs.

## 2020-11-26 NOTE — Assessment & Plan Note (Addendum)
This is an ongoing issue.  Today he reports symptoms that are concerning for sleep apnea.  I advised that we get a home sleep study.  The patient is concerned about having this done as somebody may have to come to his house which he does not want.  He will ask if they can let him come pick the sleep study device up from them.  We will additionally recheck vitamin D.

## 2020-11-26 NOTE — Progress Notes (Signed)
 Virtual Visit via telephone Note  This visit type was conducted due to national recommendations for restrictions regarding the COVID-19 pandemic (e.g. social distancing).  This format is felt to be most appropriate for this patient at this time.  All issues noted in this document were discussed and addressed.  No physical exam was performed (except for noted visual exam findings with Video Visits).   I connected with Patrick Patel today at  4:30 PM EST by telephone and verified that I am speaking with the correct person using two identifiers. Location patient: home Location provider: work Persons participating in the virtual visit: patient, provider  I discussed the limitations, risks, security and privacy concerns of performing an evaluation and management service by telephone and the availability of in person appointments. I also discussed with the patient that there may be a patient responsible charge related to this service. The patient expressed understanding and agreed to proceed.  Interactive audio and video telecommunications were attempted between this provider and patient, however failed, due to patient having technical difficulties OR patient did not have access to video capability.  We continued and completed visit with audio only.   Reason for visit: Follow-up  HPI: Chronic fatigue: This is an ongoing issue.  His vitamin D was previously low and he completed the supplementation of this though had no improvement in his fatigue.  Today he notes he has started to nap daily and is tired during the day.  He also notes he does not wake up well rested.  He is unsure if he snores.  Chronic venous insufficiency: Patient notes his leg swelling has improved quite a bit compared to years past.  Diabetes: Notes his average glucose is 170.  No polyuria or polydipsia.  No hypoglycemia.  He continues on glipizide, Jardiance, insulin NPH, and Metformin.   ROS: See pertinent positives and  negatives per HPI.  Past Medical History:  Diagnosis Date  . Diabetes (HCC)   . Hyperlipidemia   . Hypertension     No past surgical history on file.  Family History  Problem Relation Age of Onset  . Breast cancer Mother   . Thyroid cancer Mother     SOCIAL HX: Former smoker   Current Outpatient Medications:  .  Accu-Chek Softclix Lancets lancets, Use as instructedUSE TO CHECK BLOOD GLUCOSE 3 TIMES DAILY, Disp: 300 each, Rfl: 2 .  acetaminophen (TYLENOL) 500 MG tablet, Take 1,000 mg by mouth 2 (two) times daily at 10 AM and 5 PM. , Disp: , Rfl:  .  alfuzosin (UROXATRAL) 10 MG 24 hr tablet, TAKE 1 TABLET BY MOUTH  DAILY WITH BREAKFAST, Disp: 90 tablet, Rfl: 3 .  aspirin EC 325 MG tablet, Take 650 mg by mouth daily. , Disp: , Rfl:  .  blood glucose meter kit and supplies KIT, Dispense Accu-Chek Aviva plus. Check once daily as directed. For ICD 10 code E11.9., Disp: 1 each, Rfl: 0 .  Calcium-Magnesium-Zinc 167-83-8 MG TABS, Take 4 tablets by mouth daily., Disp: , Rfl:  .  chlorthalidone (HYGROTON) 50 MG tablet, TAKE 1 TABLET BY MOUTH  DAILY, Disp: 90 tablet, Rfl: 3 .  Cranberry 125 MG TABS, Take 4 tablets by mouth daily., Disp: , Rfl:  .  diphenhydrAMINE (BENADRYL) 25 MG tablet, Take 50 mg by mouth at bedtime as needed for sleep., Disp: , Rfl:  .  glipiZIDE (GLUCOTROL) 10 MG tablet, TAKE 2 TABLETS BY MOUTH  TWICE DAILY, Disp: 360 tablet, Rfl: 3 .  glucose   blood (ACCU-CHEK AVIVA PLUS) test strip, USE TO CHECK BLOOD GLUCOSE 3 TIMES DAILY, Disp: 300 each, Rfl: 3 .  insulin NPH Human (HUMULIN N) 100 UNIT/ML injection, INJECT SUBCUTANEOUSLY 20  UNITS IN THE MORNING AND 15 UNITS AT BEDTIME, Disp: 40 mL, Rfl: 3 .  Insulin Syringe-Needle U-100 (BD INSULIN SYRINGE U/F) 31G X 5/16" 0.3 ML MISC, USE TWICE DAILY WITH  INSULIN, Disp: 180 each, Rfl: 1 .  JARDIANCE 10 MG TABS tablet, TAKE 10 MG BY MOUTH TWICE  DAILY, Disp: 180 tablet, Rfl: 3 .  metFORMIN (GLUCOPHAGE-XR) 500 MG 24 hr tablet, TAKE 4  TABLETS BY MOUTH  ONCE DAILY WITH BREAKFAST, Disp: 360 tablet, Rfl: 1 .  Multiple Vitamins-Minerals (MULTIVITAMIN WITH MINERALS) tablet, Take 2 tablets by mouth daily., Disp: , Rfl:  .  Omega-3 Fatty Acids (FISH OIL) 1000 MG CPDR, Take 4 capsules by mouth every other day., Disp: , Rfl:  .  rosuvastatin (CRESTOR) 20 MG tablet, TAKE 1 TABLET BY MOUTH  DAILY, Disp: 90 tablet, Rfl: 3 .  triamcinolone cream (KENALOG) 0.1 %, APPLY CREAM EXTERNALLY 2  TIMES DAILY AS NEEDED, Disp: 45 g, Rfl: 0 .  Vitamin D, Ergocalciferol, (DRISDOL) 1.25 MG (50000 UNIT) CAPS capsule, Take 1 capsule (50,000 Units total) by mouth every 7 (seven) days., Disp: 8 capsule, Rfl: 0  EXAM: This was a telephone visit and thus no physical exam was completed.  ASSESSMENT AND PLAN:  Discussed the following assessment and plan:  Problem List Items Addressed This Visit    Chronic fatigue    This is an ongoing issue.  Today he reports symptoms that are concerning for sleep apnea.  I advised that we get a home sleep study.  The patient is concerned about having this done as somebody may have to come to his house which he does not want.  He will ask if they can let him come pick the sleep study device up from them.  We will additionally recheck vitamin D.      Type 2 diabetes mellitus with diabetic neuropathy, unspecified (HCC)    Poorly controlled.  We will have him in for an A1c.  He will continue glipizide 20 mg twice daily, Jardiance 10 mg twice daily, insulin NPH 20 units in the morning and 15 units in the evening, and Metformin 2000 mg daily.      Relevant Orders   HgB A1c   Venous stasis of both lower extremities    Patient reports improvement in this.  He will continue to monitor and elevate his legs.       Other Visit Diagnoses    Vitamin D deficiency    -  Primary   Relevant Orders   Vitamin D (25 hydroxy)   Hypersomnia       Relevant Orders   Home sleep test       I discussed the assessment and treatment  plan with the patient. The patient was provided an opportunity to ask questions and all were answered. The patient agreed with the plan and demonstrated an understanding of the instructions.   The patient was advised to call back or seek an in-person evaluation if the symptoms worsen or if the condition fails to improve as anticipated.  I provided 17 minutes of non-face-to-face time during this encounter.   Eric Sonnenberg, MD   

## 2020-12-13 ENCOUNTER — Telehealth: Payer: Self-pay | Admitting: Family Medicine

## 2020-12-13 NOTE — Telephone Encounter (Signed)
err

## 2021-02-01 ENCOUNTER — Other Ambulatory Visit: Payer: Self-pay | Admitting: Family Medicine

## 2021-02-11 ENCOUNTER — Telehealth: Payer: Self-pay | Admitting: Family Medicine

## 2021-02-11 MED ORDER — ACCU-CHEK AVIVA PLUS VI STRP: ORAL_STRIP | 3 refills | Status: DC

## 2021-02-11 NOTE — Addendum Note (Signed)
Addended by: Elise Benne T on: 02/11/2021 04:11 PM   Modules accepted: Orders

## 2021-02-11 NOTE — Telephone Encounter (Signed)
Pt needs a refill on glucose blood (ACCU-CHEK AVIVA PLUS) test strip sent to [facility]

## 2021-02-16 ENCOUNTER — Telehealth: Payer: Self-pay | Admitting: Family Medicine

## 2021-02-16 NOTE — Telephone Encounter (Signed)
Left message for patient to call back and schedule Medicare Annual Wellness Visit (AWV)   This should be a virtual visit only=30 minutes.  Last AWV 03/26/17; please schedule at anytime with Denisa O'Brien-Blaney at [facility].

## 2021-02-16 NOTE — Telephone Encounter (Signed)
Pt called and states that he does not want to have an AWV now or in the future.

## 2021-02-21 ENCOUNTER — Other Ambulatory Visit: Payer: Self-pay | Admitting: Family Medicine

## 2021-02-24 DIAGNOSIS — E119 Type 2 diabetes mellitus without complications: Secondary | ICD-10-CM | POA: Diagnosis not present

## 2021-02-24 DIAGNOSIS — I10 Essential (primary) hypertension: Secondary | ICD-10-CM | POA: Diagnosis not present

## 2021-02-24 DIAGNOSIS — E782 Mixed hyperlipidemia: Secondary | ICD-10-CM | POA: Diagnosis not present

## 2021-03-29 ENCOUNTER — Telehealth: Payer: Self-pay | Admitting: Family Medicine

## 2021-03-29 DIAGNOSIS — E785 Hyperlipidemia, unspecified: Secondary | ICD-10-CM

## 2021-03-29 DIAGNOSIS — E1140 Type 2 diabetes mellitus with diabetic neuropathy, unspecified: Secondary | ICD-10-CM

## 2021-03-29 DIAGNOSIS — Z794 Long term (current) use of insulin: Secondary | ICD-10-CM

## 2021-03-29 NOTE — Telephone Encounter (Signed)
Patient called in about lab order

## 2021-03-30 NOTE — Telephone Encounter (Signed)
Labs changed

## 2021-04-01 ENCOUNTER — Other Ambulatory Visit (INDEPENDENT_AMBULATORY_CARE_PROVIDER_SITE_OTHER): Payer: Medicare Other

## 2021-04-01 ENCOUNTER — Other Ambulatory Visit: Payer: Self-pay

## 2021-04-01 DIAGNOSIS — E559 Vitamin D deficiency, unspecified: Secondary | ICD-10-CM | POA: Diagnosis not present

## 2021-04-01 DIAGNOSIS — E785 Hyperlipidemia, unspecified: Secondary | ICD-10-CM | POA: Diagnosis not present

## 2021-04-01 DIAGNOSIS — Z794 Long term (current) use of insulin: Secondary | ICD-10-CM | POA: Diagnosis not present

## 2021-04-01 DIAGNOSIS — E1140 Type 2 diabetes mellitus with diabetic neuropathy, unspecified: Secondary | ICD-10-CM

## 2021-04-01 NOTE — Addendum Note (Signed)
Addended by: Warden Fillers on: 04/01/2021 03:47 PM   Modules accepted: Orders

## 2021-04-01 NOTE — Addendum Note (Signed)
Addended by: Robel Wuertz S on: 04/01/2021 03:47 PM   Modules accepted: Orders  

## 2021-04-02 LAB — HEMOGLOBIN A1C
Hgb A1c MFr Bld: 7.9 % of total Hgb — ABNORMAL HIGH (ref <5.7)
Mean Plasma Glucose: 180 mg/dL
eAG (mmol/L): 10 mmol/L

## 2021-04-02 LAB — LIPID PANEL
Cholesterol: 131 mg/dL (ref <200)
HDL: 31 mg/dL — ABNORMAL LOW (ref >=40)
Non-HDL Cholesterol (Calc): 100 mg/dL (calc) (ref <130)
Total CHOL/HDL Ratio: 4.2 (calc) (ref <5.0)
Triglycerides: 451 mg/dL — ABNORMAL HIGH (ref <150)

## 2021-04-02 LAB — COMPREHENSIVE METABOLIC PANEL
AG Ratio: 1.4 (calc) (ref 1.0–2.5)
ALT: 21 U/L (ref 9–46)
AST: 28 U/L (ref 10–35)
Albumin: 4.2 g/dL (ref 3.6–5.1)
Alkaline phosphatase (APISO): 67 U/L (ref 35–144)
BUN: 21 mg/dL (ref 7–25)
CO2: 24 mmol/L (ref 20–32)
Calcium: 9 mg/dL (ref 8.6–10.3)
Chloride: 100 mmol/L (ref 98–110)
Creat: 1.04 mg/dL (ref 0.70–1.18)
Globulin: 3 g/dL (calc) (ref 1.9–3.7)
Glucose, Bld: 259 mg/dL — ABNORMAL HIGH (ref 65–99)
Potassium: 3.8 mmol/L (ref 3.5–5.3)
Sodium: 140 mmol/L (ref 135–146)
Total Bilirubin: 0.6 mg/dL (ref 0.2–1.2)
Total Protein: 7.2 g/dL (ref 6.1–8.1)

## 2021-04-02 LAB — VITAMIN D 25 HYDROXY (VIT D DEFICIENCY, FRACTURES): Vit D, 25-Hydroxy: 30 ng/mL (ref 30–100)

## 2021-04-22 ENCOUNTER — Encounter: Payer: Self-pay | Admitting: Family Medicine

## 2021-04-22 ENCOUNTER — Other Ambulatory Visit: Payer: Self-pay

## 2021-04-22 ENCOUNTER — Telehealth: Payer: Self-pay | Admitting: Family Medicine

## 2021-04-22 ENCOUNTER — Ambulatory Visit (INDEPENDENT_AMBULATORY_CARE_PROVIDER_SITE_OTHER): Payer: Medicare Other | Admitting: Family Medicine

## 2021-04-22 VITALS — BP 150/70 | HR 89 | Temp 98.3°F | Ht 68.0 in | Wt 248.4 lb

## 2021-04-22 DIAGNOSIS — M79605 Pain in left leg: Secondary | ICD-10-CM | POA: Diagnosis not present

## 2021-04-22 DIAGNOSIS — E785 Hyperlipidemia, unspecified: Secondary | ICD-10-CM | POA: Diagnosis not present

## 2021-04-22 DIAGNOSIS — E1140 Type 2 diabetes mellitus with diabetic neuropathy, unspecified: Secondary | ICD-10-CM

## 2021-04-22 DIAGNOSIS — Z794 Long term (current) use of insulin: Secondary | ICD-10-CM

## 2021-04-22 DIAGNOSIS — I10 Essential (primary) hypertension: Secondary | ICD-10-CM

## 2021-04-22 DIAGNOSIS — M79604 Pain in right leg: Secondary | ICD-10-CM | POA: Diagnosis not present

## 2021-04-22 MED ORDER — OZEMPIC (0.25 OR 0.5 MG/DOSE) 2 MG/1.5ML ~~LOC~~ SOPN: PEN_INJECTOR | SUBCUTANEOUS | 1 refills | Status: AC

## 2021-04-22 MED ORDER — TETANUS-DIPHTH-ACELL PERTUSSIS 5-2.5-18.5 LF-MCG/0.5 IM SUSP: 0.5000 mL | Freq: Once | INTRAMUSCULAR | 0 refills | Status: AC

## 2021-04-22 MED ORDER — MELOXICAM 7.5 MG PO TABS: 7.5000 mg | ORAL_TABLET | Freq: Every day | ORAL | 0 refills | Status: DC

## 2021-04-22 NOTE — Assessment & Plan Note (Addendum)
Above goal though the patient declines adding additional medicine given history of lightheadedness on additional medications.  He will continue chlorthalidone 50 mg once daily.

## 2021-04-22 NOTE — Assessment & Plan Note (Signed)
Poorly controlled.  We will start him on Ozempic and ramp up on the dose.  He will continue Jardiance 25 mg once daily, NPH 25 units in the morning and 15 units in the evening, metformin, and glipizide.

## 2021-04-22 NOTE — Assessment & Plan Note (Signed)
Check LDL.  I suspect we will have to increase his Crestor though for now he will continue on Crestor 20 mg once daily.

## 2021-04-22 NOTE — Patient Instructions (Signed)
Nice to see you. We will get labs today. Please try the meloxicam for your pain.  Do not take any aspirin while you are taking the meloxicam.  Please take the meloxicam with food.  If it upsets her stomach please let us know. Please go to the pharmacy to get your tetanus vaccine. I sent the Ozempic to your pharmacy.  If you notice nausea or abdominal pain with this please let me know.

## 2021-04-22 NOTE — Telephone Encounter (Signed)
Patient has scheduled his 40m follow up and would like to do labs a few days before appt

## 2021-04-22 NOTE — Progress Notes (Signed)
[name], MD Phone: [phone]  Patrick Patel is a 80 y.o. male who presents today for f/u.  DIABETES Disease Monitoring: Blood Sugar ranges-"bad" Polyuria/phagia/dipsia- some polydipsia      Optho- UTD Medications: Compliance- taking jardiance, NPH 25 u am and 15 u pm, metformin, glipizide Hypoglycemic symptoms- no  HYPERTENSION  Disease Monitoring  Home BP Monitoring 142-154/63-70 Chest pain- much improved from previously    Dyspnea- chronic and unchanged Medications  Compliance-  Taking chlorthalidone.  Edema- chronic and stable  Hyperlipidemia: Taking Crestor.  Unable to calculate LDL on last lab draw given elevated triglycerides.  Bilateral leg pain: Patient notes bilateral pain in his knees and ankles that has been chronic and ongoing.  He takes two 325 mg aspirins at night and recently has had to take 3 of those during the day to help with the discomfort.  He notes the discomfort gets better when he walks around.   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 Accu-Chek Softclix Lancets lancets Use as instructedUSE TO CHECK BLOOD GLUCOSE 3 TIMES DAILY 300 each 2  . acetaminophen (TYLENOL) 500 MG tablet Take 1,000 mg by mouth 2 (two) times daily at 10 AM and 5 PM.     . alfuzosin (UROXATRAL) 10 MG 24 hr tablet TAKE 1 TABLET BY MOUTH  DAILY WITH BREAKFAST 90 tablet 3  . aspirin EC 325 MG tablet Take 650 mg by mouth daily.     . blood glucose meter kit and supplies KIT Dispense Accu-Chek Aviva plus. Check once daily as directed. For ICD 10 code E11.9. 1 each 0  . Calcium-Magnesium-Zinc 167-83-8 MG TABS Take 4 tablets by mouth daily.    . chlorthalidone (HYGROTON) 50 MG tablet TAKE 1 TABLET BY MOUTH  DAILY 90 tablet 3  . Cranberry 125 MG TABS Take 4 tablets by mouth daily.    . diphenhydrAMINE (BENADRYL) 25 MG  tablet Take 50 mg by mouth at bedtime as needed for sleep.    [name] glipiZIDE (GLUCOTROL) 10 MG tablet TAKE 2 TABLETS BY MOUTH  TWICE DAILY 360 tablet 3  . glucose blood (ACCU-CHEK AVIVA PLUS) test strip USE TO CHECK BLOOD GLUCOSE 3 TIMES DAILY 300 each 3  . insulin NPH Human (HUMULIN N) 100 UNIT/ML injection INJECT SUBCUTANEOUSLY 20  UNITS IN THE MORNING AND 15 UNITS AT BEDTIME 40 mL 3  . Insulin Syringe-Needle U-100 (BD INSULIN SYRINGE U/F) 31G X 5/16" 0.3 ML MISC USE TWICE DAILY WITH  INSULIN 180 each 1  . JARDIANCE 10 MG TABS tablet TAKE 1 TABLET BY MOUTH  TWICE DAILY 180 tablet 3  . metFORMIN (GLUCOPHAGE-XR) 500 MG 24 hr tablet TAKE 4 TABLETS BY MOUTH  ONCE DAILY WITH BREAKFAST 360 tablet 1  . Multiple Vitamins-Minerals (MULTIVITAMIN WITH MINERALS) tablet Take 2 tablets by mouth daily.    . Omega-3 Fatty Acids (FISH OIL) 1000 MG CPDR Take 4 capsules by mouth every other day.    . rosuvastatin (CRESTOR) 20 MG tablet TAKE 1 TABLET BY MOUTH  DAILY 90 tablet 3  . triamcinolone cream (KENALOG) 0.1 % APPLY CREAM EXTERNALLY 2  TIMES DAILY AS NEEDED 45 g 0  . Vitamin D, Ergocalciferol, (DRISDOL) 1.25 MG (50000 UNIT) CAPS capsule Take 1 capsule (50,000 Units total) by mouth every 7 (seven) days. 8 capsule 0   No  current facility-administered medications on file prior to visit.     ROS see history of present illness  Objective  Physical Exam Vitals:   04/22/21 1609  BP: (!) 150/70  Pulse: 89  Temp: 98.3 F (36.8 C)  SpO2: 99%    BP Readings from Last 3 Encounters:  04/22/21 (!) 150/70  08/27/20 140/70  05/26/20 140/80   Wt Readings from Last 3 Encounters:  04/22/21 248 lb 6.4 oz (112.7 kg)  11/26/20 252 lb (114.3 kg)  08/27/20 250 lb 8 oz (113.6 kg)    Physical Exam Constitutional:      General: He is not in acute distress.    Appearance: He is not diaphoretic.  Cardiovascular:     Rate and Rhythm: Normal rate and regular rhythm.     Heart sounds: Normal heart sounds.   Pulmonary:     Effort: Pulmonary effort is normal.     Breath sounds: Normal breath sounds.  Musculoskeletal:     Comments: Bilateral knees and lower legs with no tenderness on exam  Skin:    General: Skin is warm and dry.  Neurological:     Mental Status: He is alert.      Assessment/Plan: Please see individual problem list.  Problem List Items Addressed This Visit    Type 2 diabetes mellitus with diabetic neuropathy, unspecified ([place]) - Primary    Poorly controlled.  We will start him on Ozempic and ramp up on the dose.  He will continue Jardiance 25 mg once daily, NPH 25 units in the morning and 15 units in the evening, metformin, and glipizide.      Relevant Medications   Semaglutide,0.25 or 0.5MG/DOS, (OZEMPIC, 0.25 OR 0.5 MG/DOSE,) 2 MG/1.5ML SOPN   Other Relevant Orders   Urine Microalbumin w/creat. ratio   Hyperlipidemia    Check LDL.  I suspect we will have to increase his Crestor though for now he will continue on Crestor 20 mg once daily.      Relevant Orders   Direct LDL   Essential hypertension    Above goal though the patient declines adding additional medicine given history of lightheadedness on additional medications.  He will continue chlorthalidone 50 mg once daily.      Bilateral leg pain    Pain in his joints in his legs.  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  I advised against taking aspirin for this.  We will try meloxicam.  He will take this with food.  If it upsets his stomach he will let us know.  If is not beneficial he will let us know.      Relevant Medications   meloxicam (MOBIC) 7.5 MG tablet       Health Maintenance: He will get his tetanus vaccine at the pharmacy.  He reports having had 2 pneumonia vaccines at the [facility] previously.  Return in about 3 months (around 07/23/2021) for Diabetes.  This visit occurred during the SARS-CoV-2 public health emergency.  Safety protocols were in place, including screening questions prior to the visit,  additional usage of staff PPE, and extensive cleaning of exam room while observing appropriate contact time as indicated for disinfecting solutions.    [name], MD [place]

## 2021-04-22 NOTE — Assessment & Plan Note (Signed)
Pain in his joints in his legs.  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  I advised against taking aspirin for this.  We will try meloxicam.  He will take this with food.  If it upsets his stomach he will let us know.  If is not beneficial he will let us know.

## 2021-04-23 LAB — MICROALBUMIN / CREATININE URINE RATIO
Creatinine, Urine: 48 mg/dL (ref 20–320)
Microalb Creat Ratio: 40 mcg/mg creat — ABNORMAL HIGH (ref <30)
Microalb, Ur: 1.9 mg/dL

## 2021-04-23 LAB — LDL CHOLESTEROL, DIRECT: Direct LDL: 51 mg/dL (ref <100)

## 2021-04-25 ENCOUNTER — Telehealth: Payer: Self-pay

## 2021-04-25 NOTE — Telephone Encounter (Signed)
Lvm for patient to call back.  Janeene Sand,cma  

## 2021-04-25 NOTE — Telephone Encounter (Signed)
I called the patient to inform him that he does not need labs, A VM was left to call back and let him know that  all that is needed is a A1C and we will prick his finger at his appointment that is scheduled in 3 months.  Kenidy Crossland,cma

## 2021-04-25 NOTE — Telephone Encounter (Signed)
The patient will only be due for an A1c at that time.  We can prick his finger while he is in the office to get that result.

## 2021-04-25 NOTE — Telephone Encounter (Signed)
Patient returned office phone call. Note was read to patient and patient understood.

## 2021-05-12 ENCOUNTER — Telehealth: Payer: Self-pay

## 2021-05-12 NOTE — Telephone Encounter (Signed)
LVM for the patient to call back for lab results.  Patrick Patel,cma  

## 2021-05-12 NOTE — Telephone Encounter (Signed)
-----   Message from Glori Luis, MD sent at 05/03/2021  4:19 PM EDT ----- Please let the patient know his LDL is acceptable.  His urine protein level is elevated.  This is likely related to his diabetes.  I would suggest starting on a low-dose of losartan to help protect his kidneys.

## 2021-05-17 ENCOUNTER — Other Ambulatory Visit: Payer: Self-pay | Admitting: Family Medicine

## 2021-05-24 ENCOUNTER — Other Ambulatory Visit: Payer: Self-pay | Admitting: Family Medicine

## 2021-05-24 DIAGNOSIS — M79604 Pain in right leg: Secondary | ICD-10-CM

## 2021-06-07 ENCOUNTER — Telehealth: Payer: Self-pay | Admitting: Family Medicine

## 2021-06-07 NOTE — Telephone Encounter (Signed)
Left message for patient to call back and schedule Medicare Annual Wellness Visit (AWV) in office.   If not able to come in office, please offer to do virtually or by telephone.   Last AWV:03/26/2017  Please schedule at anytime with Nurse Health Advisor.

## 2021-06-10 ENCOUNTER — Other Ambulatory Visit: Payer: Self-pay | Admitting: Family Medicine

## 2021-06-22 ENCOUNTER — Other Ambulatory Visit: Payer: Self-pay | Admitting: Family Medicine

## 2021-06-22 DIAGNOSIS — M79604 Pain in right leg: Secondary | ICD-10-CM

## 2021-07-12 ENCOUNTER — Telehealth: Payer: Self-pay | Admitting: Family Medicine

## 2021-07-12 NOTE — Telephone Encounter (Signed)
Left message for patient to call back and schedule Medicare Annual Wellness Visit (AWV) in office.   If not able to come in office, please offer to do virtually or by telephone.   Last AWV:03/26/2017  Please schedule at anytime with Nurse Health Advisor.   

## 2021-07-27 ENCOUNTER — Ambulatory Visit: Payer: Medicare Other | Admitting: Family Medicine

## 2021-08-03 ENCOUNTER — Other Ambulatory Visit: Payer: Self-pay

## 2021-08-03 ENCOUNTER — Ambulatory Visit (INDEPENDENT_AMBULATORY_CARE_PROVIDER_SITE_OTHER): Payer: Medicare Other | Admitting: Family Medicine

## 2021-08-03 ENCOUNTER — Other Ambulatory Visit: Payer: Self-pay | Admitting: Family Medicine

## 2021-08-03 ENCOUNTER — Encounter: Payer: Self-pay | Admitting: Family Medicine

## 2021-08-03 VITALS — BP 140/80 | HR 96 | Temp 98.7°F | Ht 68.0 in | Wt 244.6 lb

## 2021-08-03 DIAGNOSIS — E1140 Type 2 diabetes mellitus with diabetic neuropathy, unspecified: Secondary | ICD-10-CM | POA: Diagnosis not present

## 2021-08-03 DIAGNOSIS — Z794 Long term (current) use of insulin: Secondary | ICD-10-CM

## 2021-08-03 DIAGNOSIS — R29898 Other symptoms and signs involving the musculoskeletal system: Secondary | ICD-10-CM | POA: Diagnosis not present

## 2021-08-03 DIAGNOSIS — I10 Essential (primary) hypertension: Secondary | ICD-10-CM | POA: Diagnosis not present

## 2021-08-03 LAB — POCT GLYCOSYLATED HEMOGLOBIN (HGB A1C): Hemoglobin A1C: 6.4 % — AB (ref 4.0–5.6)

## 2021-08-03 MED ORDER — FREESTYLE LIBRE 14 DAY READER DEVI: 0 refills | Status: DC

## 2021-08-03 MED ORDER — FREESTYLE LIBRE 14 DAY SENSOR MISC: 1 refills | Status: DC

## 2021-08-03 MED ORDER — EMPAGLIFLOZIN 25 MG PO TABS: 25.0000 mg | ORAL_TABLET | Freq: Every day | ORAL | 2 refills | Status: DC

## 2021-08-03 NOTE — Assessment & Plan Note (Signed)
Improved on recheck.  Adequately controlled for age.  He will continue chlorthalidone 50 mg daily.

## 2021-08-03 NOTE — Assessment & Plan Note (Addendum)
His A1c is much improved at 6.4.  He will continue glipizide 10 mg twice daily, NPH insulin 25 mg in the morning and 15 mg in the evening, Jardiance 25 mg daily, metformin XR 1000 mg daily, and Ozempic 0.5 mg weekly.  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  Consider an ACE inhibitor or an ARB if this is still abnormal.  The patient requests a freestyle libre.  I will send this and I did advise his insurance may not pay for it.

## 2021-08-03 NOTE — Progress Notes (Signed)
Eric Sonnenberg, MD Phone: [phone]  Patrick Patel is a 79 y.o. male who presents today for f/u.  DIABETES Disease Monitoring: Blood Sugar ranges-not checking consistently Polyuria/phagia/dipsia- no       Medications: Compliance- taking glipizide, NPH 25 u am and 15 u pm, ozempic 0.5 mg weekly, jardiance, metformin Hypoglycemic symptoms- no  HYPERTENSION Disease Monitoring Home BP Monitoring 140-150/<80 Chest pain- chronic, intermittent, improved    Dyspnea- chronic and stable Medications Compliance-  taking chlorthalidone.   Edema- chronic and stable  Leg weakness: Patient notes he feels as though his legs are getting weaker.  It occurs when he is walking.  He feels as though the right may be worse than the left.  No numbness in his legs.  Notes this has been a progressive issue over the last year.    Social History   Tobacco Use  Smoking Status Former  Smokeless Tobacco Current   Types: Snuff    Current Outpatient Medications on File Prior to Visit  Medication Sig Dispense Refill   Accu-Chek Softclix Lancets lancets Use as instructedUSE TO CHECK BLOOD GLUCOSE 3 TIMES DAILY 300 each 2   acetaminophen (TYLENOL) 500 MG tablet Take 1,000 mg by mouth 2 (two) times daily at 10 AM and 5 PM.      alfuzosin (UROXATRAL) 10 MG 24 hr tablet TAKE 1 TABLET BY MOUTH  DAILY WITH BREAKFAST 90 tablet 3   aspirin EC 325 MG tablet Take 650 mg by mouth daily.      blood glucose meter kit and supplies KIT Dispense Accu-Chek Aviva plus. Check once daily as directed. For ICD 10 code E11.9. 1 each 0   Calcium-Magnesium-Zinc 167-83-8 MG TABS Take 4 tablets by mouth daily.     chlorthalidone (HYGROTON) 50 MG tablet TAKE 1 TABLET BY MOUTH  DAILY 90 tablet 3   Cranberry 125 MG TABS Take 4 tablets by mouth daily.     diphenhydrAMINE (BENADRYL) 25 MG tablet Take 50 mg by mouth at bedtime as needed for sleep.     glipiZIDE (GLUCOTROL) 10 MG tablet TAKE 2 TABLETS BY MOUTH  TWICE DAILY 360 tablet 3    glucose blood (ACCU-CHEK AVIVA PLUS) test strip USE TO CHECK BLOOD GLUCOSE 3 TIMES DAILY 300 each 3   insulin NPH Human (HUMULIN N) 100 UNIT/ML injection INJECT SUBCUTANEOUSLY 20  UNITS IN THE MORNING AND 15 UNITS AT BEDTIME 40 mL 3   Insulin Syringe-Needle U-100 (BD INSULIN SYRINGE U/F) 31G X 5/16" 0.3 ML MISC USE TWICE DAILY WITH  INSULIN 180 each 1   metFORMIN (GLUCOPHAGE-XR) 500 MG 24 hr tablet TAKE 4 TABLETS BY MOUTH  ONCE DAILY WITH BREAKFAST 360 tablet 1   Multiple Vitamins-Minerals (MULTIVITAMIN WITH MINERALS) tablet Take 2 tablets by mouth daily.     Omega-3 Fatty Acids (FISH OIL) 1000 MG CPDR Take 4 capsules by mouth every other day.     OZEMPIC, 0.25 OR 0.5 MG/DOSE, 2 MG/1.5ML SOPN Inject into the skin.     rosuvastatin (CRESTOR) 20 MG tablet TAKE 1 TABLET BY MOUTH  DAILY 90 tablet 3   triamcinolone cream (KENALOG) 0.1 % APPLY CREAM EXTERNALLY 2  TIMES DAILY AS NEEDED 45 g 0   Vitamin D, Ergocalciferol, (DRISDOL) 1.25 MG (50000 UNIT) CAPS capsule Take 1 capsule (50,000 Units total) by mouth every 7 (seven) days. 8 capsule 0   meloxicam (MOBIC) 7.5 MG tablet TAKE 1 TABLET BY MOUTH EVERY DAY (Patient not taking: Reported on 08/03/2021) 30 tablet 0     No current facility-administered medications on file prior to visit.     ROS see history of present illness  Objective  Physical Exam Vitals:   08/03/21 1607 08/03/21 1625  BP: (!) 152/78 140/80  Pulse: 96   Temp: 98.7 F (37.1 C)   SpO2: 92%     BP Readings from Last 3 Encounters:  08/03/21 140/80  04/22/21 (!) 150/70  08/27/20 140/70   Wt Readings from Last 3 Encounters:  08/03/21 244 lb 9.6 oz (110.9 kg)  04/22/21 248 lb 6.4 oz (112.7 kg)  11/26/20 252 lb (114.3 kg)    Physical Exam Constitutional:      General: He is not in acute distress.    Appearance: He is not diaphoretic.  Cardiovascular:     Rate and Rhythm: Normal rate and regular rhythm.     Heart sounds: Normal heart sounds.  Pulmonary:     Effort:  Pulmonary effort is normal.     Breath sounds: Normal breath sounds.  Skin:    General: Skin is warm and dry.  Neurological:     Mental Status: He is alert.     Comments: 5/5 strength bilateral quads, hamstrings, plantar flexion, and dorsiflexion, sensation to light touch intact bilateral lower extremities     Assessment/Plan: Please see individual problem list.  Problem List Items Addressed This Visit     Essential hypertension - Primary    Improved on recheck.  Adequately controlled for age.  He will continue chlorthalidone 50 mg daily.      Leg weakness, bilateral    Subjective leg weakness.  His strength exam in his legs is normal.  We will refer for physical therapy.      Relevant Orders   Ambulatory referral to Physical Therapy   Type 2 diabetes mellitus with diabetic neuropathy, unspecified ([place])    His A1c is much improved at 6.4.  He will continue glipizide 10 mg twice daily, NPH insulin 25 mg in the morning and 15 mg in the evening, Jardiance 25 mg daily, metformin XR 1000 mg daily, and Ozempic 0.5 mg weekly.  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  Consider an ACE inhibitor or an ARB if this is still abnormal.  The patient requests a freestyle libre.  I will send this and I did advise his insurance may not pay for it.      Relevant Medications   OZEMPIC, 0.25 OR 0.5 MG/DOSE, 2 MG/1.5ML SOPN   empagliflozin (JARDIANCE) 25 MG TABS tablet   Continuous Blood Gluc Receiver (FREESTYLE LIBRE 14 DAY READER) DEVI   Continuous Blood Gluc Sensor (FREESTYLE LIBRE 14 DAY SENSOR) MISC   Other Relevant Orders   Urine Microalbumin w/creat. ratio   POCT HgB A1C (Completed)     Return in about 4 months (around 12/03/2021).  This visit occurred during the SARS-CoV-2 public health emergency.  Safety protocols were in place, including screening questions prior to the visit, additional usage of staff PPE, and extensive cleaning of  exam room while observing appropriate contact time as indicated for disinfecting solutions.    [name], MD [place]

## 2021-08-03 NOTE — Assessment & Plan Note (Signed)
Subjective leg weakness.  His strength exam in his legs is normal.  We will refer for physical therapy.

## 2021-08-03 NOTE — Patient Instructions (Signed)
Nice to see you. Please continue to periodically monitor your blood pressure. Physical therapy should call you to schedule an evaluation.

## 2021-08-04 LAB — MICROALBUMIN / CREATININE URINE RATIO
Creatinine,U: 55.3 mg/dL
Microalb Creat Ratio: 4 mg/g (ref 0.0–30.0)
Microalb, Ur: 2.2 mg/dL — ABNORMAL HIGH (ref 0.0–1.9)

## 2021-08-31 ENCOUNTER — Ambulatory Visit: Payer: Medicare Other | Attending: Family Medicine

## 2021-08-31 DIAGNOSIS — R262 Difficulty in walking, not elsewhere classified: Secondary | ICD-10-CM | POA: Insufficient documentation

## 2021-08-31 DIAGNOSIS — M6281 Muscle weakness (generalized): Secondary | ICD-10-CM | POA: Diagnosis not present

## 2021-08-31 NOTE — Therapy (Signed)
[facility] [address], [place], [address] Phone: [phone]   Fax:  [phone]  Physical Therapy Evaluation  Patient Details  Name: Patrick Patel MRN: [id number] Date of Birth: [birth year] Referring Provider (PT): Glori Luis, MD  Encounter Date: 08/31/2021   PT End of Session - 08/31/21 1504     Visit Number 1    Number of Visits 17    Date for PT Re-Evaluation 10/27/21    Authorization Type 1    Authorization Time Period 10    PT Start Time 1504    PT Stop Time 1601    PT Time Calculation (min) 57 min    Activity Tolerance Patient tolerated treatment well    Behavior During Therapy [facility] for tasks assessed/performed             Past Medical History:  Diagnosis Date   Diabetes (HCC)    Hyperlipidemia    Hypertension     No past surgical history on file.  There were no vitals filed for this visit.    Subjective Assessment - 08/31/21 1511     Subjective L low back: 0/10 currently. 3/10 at worst for the psat 3 months.    Pertinent History B leg weakness. Gradual onset about 3-4 years and keeps getting weaker. Has been using a SPC cane for about a year on R side. Attributes leg weakness to diabetes. Has back pain all his life. Currently has L low back/posterior hip pain. Both knees hurt as well. Feels numbness at his toes both feet as well as decreased sensation L and R posterior medial leg (saphenous nerve). Also feels medial and dorsal foot pain bilaterally which is worst at night (joints in feet). Pt lays down on R side, then turns onto his L side to go to sleep. Also sleeps on memory foam. Can walk up hill almost normal and fast wiht his SPC. Pt has to take small steps when walking down hill.    Patient Stated Goals Be able to to more things everyday.    Currently in Pain? Yes    Pain Location Back    Pain Orientation Left;Posterior;Lower    Pain Type Chronic pain    Pain Onset More  than a month ago    Pain Frequency Occasional    Aggravating Factors  Standing up, or shopping in the store for about 30 minutes,                [facility] PT Assessment - 08/31/21 1507       Assessment   Medical Diagnosis R29.898 (ICD-10-CM) - Leg weakness, bilateral    Referring Provider (PT) Glori Luis, MD    Onset Date/Surgical Date 08/03/21   Date PT referral signed     Precautions   Precaution Comments Fall risk      Restrictions   Other Position/Activity Restrictions No kown retrictions      Balance Screen   Has the patient fallen in the past 6 months Yes    How many times? 1   Spring 2022, chair broke that pt was sitting on. Did not hurt anything.   Has the patient had a decrease in activity level because of a fear of falling?  Yes    Is the patient reluctant to leave their home because of a fear of falling?  No      Home Environment   Additional Comments Pt lives in a 1 story  home with sister. 1 seven inch step to enter side door, no rail but there is a brick wall.      Prior Function   Vocation Retired      Observation/Other Assessments   Observations Supine position: R LE longer. Long sit position: Equal length (possible anterior nutaion on R innominate)    Focus on Therapeutic Outcomes (FOTO)  Lower leg FOTO 39      Posture/Postural Control   Posture Comments protracted neck, B protracted shoulders, kyphosis, R lateral shift, L lumbar rotation, L hip higher, R lumbar convexity around L2.      AROM   Lumbar Flexion WFL with L low back pain    Lumbar Extension limited    Lumbar - Right Side Bend limited with R low back pain    Lumbar - Left Side [facility] with L low back pain on return to standing    Lumbar - Right Rotation WFL    Lumbar - Left Rotation [facility]      Strength   Right Hip Flexion 4-/5    Right Hip Extension 4+/5   seated manually resisted   Right Hip ABduction 4/5   seated manually resisted clamshell   Left Hip Flexion 4/5    Left Hip  Extension 4/5   seated manually resisted   Left Hip ABduction 4/5   seated manually resisted clamshell   Right Knee Flexion 4/5   with L gastroc cramp   Right Knee Extension 4+/5    Left Knee Flexion 4+/5    Left Knee Extension 5/5      Ambulation/Gait   Gait Comments Gait: some unsteadiness, SPC on R, R pelvic drop during L LE stance phase                        Objective measurements completed on examination: See above findings.   No latex allergies   Has not had back surgery. R LE is shorter secondary to fracture when he was a teenager.  Muscles in his thighs (quads) and butt are sore at night. Legs feel more weak the longer he sits. Can stand pretty good without the cane but harder than it used to be    Therapeutic exercise  Seated hip extension isometrics, elevated table. PT holding onto table   R 6x5 seconds. R sciatic pain, eases with rest  L 10x5 seconds for 2 sets. L gastroc twinge, eases with rest.   Seated hip flexion isometrics   L 10x5 seconds for 2 sets  Decreased R low back extension palpated  No sciatic symptoms  Improved exercise technique, movement at target joints, use of target muscles after mod verbal, visual, tactile cues.    Response to treatment Pt tolerated session well without aggravation of symptoms.    Clinical impression Pt is a 80 year old male who came to physical therapy secondary to B LE weakness. He also presents with altered gait pattern and posture, L low back/posterior hip pain, bilateral hip weakness, LE paresthesia, and difficulty performing tasks which involve standing and walking. Pt will benefit from skilled physical therapy services to address the aforementioned deficits.               PT Education - 08/31/21 1620     Education Details ther-ex, POC    Person(s) Educated Patient    Methods Explanation;Demonstration;Tactile cues;Verbal cues    Comprehension Returned demonstration;Verbalized understanding  PT Short Term Goals - 08/31/21 1623       PT SHORT TERM GOAL #1   Title PT will be independent with his initial HEP to improve strength and ability to perform standing tasks with less difficulty.    Time 3    Period Weeks    Status New    Target Date 09/22/21               PT Long Term Goals - 08/31/21 1624       PT LONG TERM GOAL #1   Title Pt will improve bilateral hip extension and abduction strength by at least 1/2 MMT grade to promote ability to perform standing tasks such as grocery shopping with less difficulty.    Baseline Hip extension 4+/5 R, 4/5 L,  hip abduction 4/5 R and L (08/31/2021)    Time 8    Period Weeks    Status New    Target Date 10/27/21      PT LONG TERM GOAL #2   Title Pt will improve his FOTO score by at least 10 points as a demonstration of improved function.    Baseline Lower leg FOTO 39 (08/31/2021)    Time 8    Period Weeks    Status New    Target Date 10/27/21                    Plan - 08/31/21 1620     Clinical Impression Statement Pt is a 80 year old male who came to physical therapy secondary to B LE weakness. He also presents with altered gait pattern and posture, L low back/posterior hip pain, bilateral hip weakness, LE paresthesia, and difficulty performing tasks which involve standing and walking. Pt will benefit from skilled physical therapy services to address the aforementioned deficits.    Personal Factors and Comorbidities Comorbidity 3+;Age;Time since onset of injury/illness/exacerbation;Fitness;Past/Current Experience    Comorbidities Diabetes, HTN, OA, CAD    Examination-Activity Limitations Stairs;Locomotion Level;Stand;Carry    Stability/Clinical Decision Making Evolving/Moderate complexity   Pt states he continues to get weaker   Clinical Decision Making Moderate    Rehab Potential Fair    PT Frequency 2x / week    PT Duration 8 weeks    PT Treatment/Interventions Therapeutic  activities;Therapeutic exercise;Balance training;Neuromuscular re-education;Patient/family education;Manual techniques;Dry needling;Aquatic Therapy;Electrical Stimulation;Iontophoresis 4mg /ml Dexamethasone;Gait training;Stair training;Functional mobility training    PT Next Visit Plan Posture, thoracic extension, hip extension, scapular, trunk, hip strengthening, balance, manual techniques, modalities PRN    Consulted and Agree with Plan of Care Patient             Patient will benefit from skilled therapeutic intervention in order to improve the following deficits and impairments:  Pain, Improper body mechanics, Postural dysfunction, Difficulty walking, Decreased strength, Decreased balance  Visit Diagnosis: Muscle weakness (generalized)  Difficulty in walking, not elsewhere classified     Problem List Patient Active Problem List   Diagnosis Date Noted   Leg weakness, bilateral 08/03/2021   Bilateral leg pain 04/22/2021   Chronic fatigue 08/31/2020   Low back pain 05/26/2020   Abdominal pain 05/26/2020   Abrasion of toe of right foot 05/26/2020   Onychomycosis 05/26/2020   Pedal edema 05/26/2020   De Quervain's tenosynovitis, left 05/26/2020   Trigger finger 02/23/2020   GERD (gastroesophageal reflux disease) 11/20/2019   Hyperpigmented skin lesion 02/06/2019   Hematuria 02/04/2019   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   DOE (dyspnea  on exertion) 10/25/2018   Contusion of toenail of right foot 10/25/2018   Decreased pedal pulses 03/19/2018   Lump in throat 12/17/2017   Diarrhea 02/20/2017   Dysuria 02/16/2017   Rotator cuff impingement syndrome 11/17/2016   Foreskin problem 11/17/2016   Essential hypertension 11/17/2016   CAD (coronary artery disease) 05/04/2016   Type 2 diabetes mellitus with diabetic neuropathy, unspecified (HCC) 04/13/2016   Hyperlipidemia 04/13/2016   Chest pain 03/12/2016   BPH (benign prostatic  hyperplasia) 03/12/2016   Venous stasis of both lower extremities 03/12/2016   Osteoarthritis 03/09/2015    Loralyn Freshwater PT, DPT   08/31/2021, 6:13 PM  [place] [facility] [address] [place], [address] Phone: [phone]   Fax:  [phone]  Name: Patrick Patel MRN: [id number] Date of Birth: [birth year]

## 2021-09-05 ENCOUNTER — Ambulatory Visit: Payer: Medicare Other

## 2021-09-05 DIAGNOSIS — R262 Difficulty in walking, not elsewhere classified: Secondary | ICD-10-CM | POA: Diagnosis not present

## 2021-09-05 DIAGNOSIS — M6281 Muscle weakness (generalized): Secondary | ICD-10-CM | POA: Diagnosis not present

## 2021-09-05 NOTE — Therapy (Signed)
[place] [facility] [address], [place], [address] Phone: [phone]   Fax:  [phone]  Physical Therapy Treatment  Patient Details  Name: Patrick Patel MRN: [id number] Date of Birth: [birth year] Referring Provider (PT): Glori Luis, MD   Encounter Date: 09/05/2021   PT End of Session - 09/05/21 1505     Visit Number 2    Number of Visits 17    Date for PT Re-Evaluation 10/27/21    Authorization Type 2    Authorization Time Period 10    PT Start Time 1505    PT Stop Time 1546    PT Time Calculation (min) 41 min    Activity Tolerance Patient tolerated treatment well    Behavior During Therapy [facility] for tasks assessed/performed             Past Medical History:  Diagnosis Date   Diabetes (HCC)    Hyperlipidemia    Hypertension     No past surgical history on file.  There were no vitals filed for this visit.   Subjective Assessment - 09/05/21 1507     Subjective About the same. No immediate pain currently. Knees always hurt.    Pertinent History B leg weakness. Gradual onset about 3-4 years and keeps getting weaker. Has been using a SPC cane for about a year on R side. Attributes leg weakness to diabetes. Has back pain all his life. Currently has L low back/posterior hip pain. Both knees hurt as well. Feels numbness at his toes both feet as well as decreased sensation L and R posterior medial leg (saphenous nerve). Also feels medial and dorsal foot pain bilaterally which is worst at night (joints in feet). Pt lays down on R side, then turns onto his L side to go to sleep. Also sleeps on memory foam. Can walk up hill almost normal and fast wiht his SPC. Pt has to take small steps when walking down hill.    Patient Stated Goals Be able to to more things everyday.    Currently in Pain? Yes    Pain Score 4     Pain Location Knee    Pain Orientation Right;Left    Pain Onset More than a month ago                                         PT Education - 09/05/21 1748     Education Details ther-ex, HEP    Person(s) Educated Patient    Methods Explanation;Demonstration;Tactile cues;Verbal cues;Handout    Comprehension Returned demonstration;Verbalized understanding              Objective   No latex allergies    Has not had back surgery. R LE is shorter secondary to fracture when he was a teenager.  Muscles in his thighs (quads) and butt are sore at night. Legs feel more weak the longer he sits. Can stand pretty good without the cane but harder than it used to be       [employer]   Therapeutic exercise   Sit <> stand 3x with emphasis on pushing hands down instead to towards the back to maintain chair in place.   Sitting on elevated table as high as the bed, yellow band resist at ankles, heel on furniture slider, hip abduction  R 30x,   L 30x  Reviewed and given as part of his HEP. Pt demonstrated and verbalized understanding. Handout provided.    Reclined position manually resisted hip abduction   R 10x3  L 10x3. PT cues to decreased L hip ER compensation   Hip extension isometrics    R 5x2. R knee discomfort.    L 5x2  Sitting on elevated able (hips less than 90 degrees abduction  Clamshells blue band 10x  R gastroc cramp, eases with rest and standing.   Seated hip adduction isometrics with ball 11x5 seconds    Improved exercise technique, movement at target joints, use of target muscles after mod verbal, visual, tactile cues.      Response to treatment Pt tolerated session well without aggravation of symptoms.      Clinical impression Worked on glute med and max strengthening to promote single leg stance strength and balance during gait and improve steadiness while ambulating. Pt tolerated session well without aggravation of symptoms.Pt will benefit from continued skilled physical therapy services to decrease pain,  improve strength and function.      PT Short Term Goals - 08/31/21 1623       PT SHORT TERM GOAL #1   Title PT will be independent with his initial HEP to improve strength and ability to perform standing tasks with less difficulty.    Time 3    Period Weeks    Status New    Target Date 09/22/21               PT Long Term Goals - 08/31/21 1624       PT LONG TERM GOAL #1   Title Pt will improve bilateral hip extension and abduction strength by at least 1/2 MMT grade to promote ability to perform standing tasks such as grocery shopping with less difficulty.    Baseline Hip extension 4+/5 R, 4/5 L,  hip abduction 4/5 R and L (08/31/2021)    Time 8    Period Weeks    Status New    Target Date 10/27/21      PT LONG TERM GOAL #2   Title Pt will improve his FOTO score by at least 10 points as a demonstration of improved function.    Baseline Lower leg FOTO 39 (08/31/2021)    Time 8    Period Weeks    Status New    Target Date 10/27/21                   Plan - 09/05/21 1748     Clinical Impression Statement Worked on glute med and max strengthening to promote single leg stance strength and balance during gait and improve steadiness while ambulating. Pt tolerated session well without aggravation of symptoms.Pt will benefit from continued skilled physical therapy services to decrease pain, improve strength and function.    Personal Factors and Comorbidities Comorbidity 3+;Age;Time since onset of injury/illness/exacerbation;Fitness;Past/Current Experience    Comorbidities Diabetes, HTN, OA, CAD    Examination-Activity Limitations Stairs;Locomotion Level;Stand;Carry    Stability/Clinical Decision Making Stable/Uncomplicated   Pt states he continues to get weaker   Rehab Potential Fair    PT Frequency 2x / week    PT Duration 8 weeks    PT Treatment/Interventions Therapeutic activities;Therapeutic exercise;Balance training;Neuromuscular re-education;Patient/family  education;Manual techniques;Dry needling;Aquatic Therapy;Electrical Stimulation;Iontophoresis 4mg /ml Dexamethasone;Gait training;Stair training;Functional mobility training    PT Next Visit Plan Posture, thoracic extension, hip extension, scapular, trunk, hip strengthening, balance, manual techniques, modalities PRN    Consulted and Agree with  Plan of Care Patient             Patient will benefit from skilled therapeutic intervention in order to improve the following deficits and impairments:  Pain, Improper body mechanics, Postural dysfunction, Difficulty walking, Decreased strength, Decreased balance  Visit Diagnosis: Muscle weakness (generalized)  Difficulty in walking, not elsewhere classified     Problem List Patient Active Problem List   Diagnosis Date Noted   Leg weakness, bilateral 08/03/2021   Bilateral leg pain 04/22/2021   Chronic fatigue 08/31/2020   Low back pain 05/26/2020   Abdominal pain 05/26/2020   Abrasion of toe of right foot 05/26/2020   Onychomycosis 05/26/2020   Pedal edema 05/26/2020   De Quervain's tenosynovitis, left 05/26/2020   Trigger finger 02/23/2020   GERD (gastroesophageal reflux disease) 11/20/2019   Hyperpigmented skin lesion 02/06/2019   Hematuria 02/04/2019   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   DOE (dyspnea on exertion) 10/25/2018   Contusion of toenail of right foot 10/25/2018   Decreased pedal pulses 03/19/2018   Lump in throat 12/17/2017   Diarrhea 02/20/2017   Dysuria 02/16/2017   Rotator cuff impingement syndrome 11/17/2016   Foreskin problem 11/17/2016   Essential hypertension 11/17/2016   CAD (coronary artery disease) 05/04/2016   Type 2 diabetes mellitus with diabetic neuropathy, unspecified (HCC) 04/13/2016   Hyperlipidemia 04/13/2016   Chest pain 03/12/2016   BPH (benign prostatic hyperplasia) 03/12/2016   Venous stasis of both lower extremities 03/12/2016    Osteoarthritis 03/09/2015   Loralyn Freshwater PT, DPT   09/05/2021, 5:55 PM  [place] [facility] [address] [place], [address] Phone: [phone]   Fax:  [phone]  Name: SEBASTIEN JACKSON MRN: [id number] Date of Birth: 07-Sep-1941

## 2021-09-05 NOTE — Patient Instructions (Addendum)
  Sitting on elevated table as high as the bed, yellow band resist at ankles, heel on furniture slider, hip abduction  R 30x,   L 30x   Reviewed and given as part of his HEP. Pt demonstrated and verbalized understanding. Handout provided.

## 2021-09-12 ENCOUNTER — Ambulatory Visit: Payer: Medicare Other

## 2021-09-14 ENCOUNTER — Ambulatory Visit: Payer: Medicare Other

## 2021-09-25 ENCOUNTER — Other Ambulatory Visit: Payer: Self-pay | Admitting: Family Medicine

## 2021-09-25 DIAGNOSIS — E1140 Type 2 diabetes mellitus with diabetic neuropathy, unspecified: Secondary | ICD-10-CM

## 2021-09-25 DIAGNOSIS — Z794 Long term (current) use of insulin: Secondary | ICD-10-CM

## 2021-10-05 ENCOUNTER — Other Ambulatory Visit: Payer: Self-pay | Admitting: Family Medicine

## 2021-10-19 ENCOUNTER — Other Ambulatory Visit: Payer: Self-pay | Admitting: Family Medicine

## 2021-10-21 ENCOUNTER — Ambulatory Visit: Payer: Medicare Other | Admitting: Family Medicine

## 2021-10-28 ENCOUNTER — Encounter: Payer: Self-pay | Admitting: Family Medicine

## 2021-10-28 ENCOUNTER — Other Ambulatory Visit: Payer: Self-pay

## 2021-10-28 ENCOUNTER — Ambulatory Visit (INDEPENDENT_AMBULATORY_CARE_PROVIDER_SITE_OTHER): Payer: Medicare Other | Admitting: Family Medicine

## 2021-10-28 VITALS — BP 120/70 | HR 77 | Temp 98.1°F | Ht 68.0 in | Wt 244.8 lb

## 2021-10-28 DIAGNOSIS — I10 Essential (primary) hypertension: Secondary | ICD-10-CM

## 2021-10-28 DIAGNOSIS — Z794 Long term (current) use of insulin: Secondary | ICD-10-CM | POA: Diagnosis not present

## 2021-10-28 DIAGNOSIS — R29898 Other symptoms and signs involving the musculoskeletal system: Secondary | ICD-10-CM | POA: Diagnosis not present

## 2021-10-28 DIAGNOSIS — E1140 Type 2 diabetes mellitus with diabetic neuropathy, unspecified: Secondary | ICD-10-CM | POA: Diagnosis not present

## 2021-10-28 NOTE — Patient Instructions (Addendum)
Nice to see you. We will get lab work today and contact you with results. 

## 2021-10-29 LAB — BASIC METABOLIC PANEL WITH GFR
BUN: 24 mg/dL (ref 7–25)
CO2: 26 mmol/L (ref 20–32)
Calcium: 10 mg/dL (ref 8.6–10.3)
Chloride: 101 mmol/L (ref 98–110)
Creat: 0.98 mg/dL (ref 0.70–1.28)
Glucose, Bld: 132 mg/dL — ABNORMAL HIGH (ref 65–99)
Potassium: 3.7 mmol/L (ref 3.5–5.3)
Sodium: 138 mmol/L (ref 135–146)
eGFR: 78 mL/min/{1.73_m2} (ref >=60)

## 2021-10-29 LAB — HEMOGLOBIN A1C
Hgb A1c MFr Bld: 6.4 % of total Hgb — ABNORMAL HIGH (ref <5.7)
Mean Plasma Glucose: 137 mg/dL
eAG (mmol/L): 7.6 mmol/L

## 2021-10-31 NOTE — Assessment & Plan Note (Signed)
Chronic issue.  Stable.  He will monitor given inability to tolerate PT.

## 2021-10-31 NOTE — Assessment & Plan Note (Signed)
Adequately controlled for age.  He will continue chlorthalidone 50 mg once daily.  Check lab work.

## 2021-10-31 NOTE — Progress Notes (Signed)
Patrick Rumps, MD Phone: [phone]  Patrick Patel is a 80 y.o. male who presents today for follow-up.  DIABETES Disease Monitoring: Polyuria/phagia/dipsia- no      Optho- due Medications: Compliance- taking NPH 25 units in the morning 15 units at bedtime, Jardiance, metformin, glipizide, and Ozempic hypoglycemic symptoms-no  HYPERTENSION Disease Monitoring Home BP Monitoring 140s/60s-70s chest pain-much improved compared to previously    dyspnea-much improved compared to previously Medications Compliance-taking chlorthalidone.  Edema-no BMET    Component Value Date/Time   NA 138 10/28/2021 1644   K 3.7 10/28/2021 1644   CL 101 10/28/2021 1644   CO2 26 10/28/2021 1644   GLUCOSE 132 (H) 10/28/2021 1644   BUN 24 10/28/2021 1644   CREATININE 0.98 10/28/2021 1644   CALCIUM 10.0 10/28/2021 1644   GFRNONAA >60 12/19/2018 0539   GFRAA >60 12/19/2018 0539   Leg weakness: Patient notes he stopped physical therapy as it made him too sore.  He has not had any falls.  He feels as though this is generally stable.  Social History   Tobacco Use  Smoking Status Former  Smokeless Tobacco Current   Types: Snuff    Current Outpatient Medications on File Prior to Visit  Medication Sig Dispense Refill   Accu-Chek Softclix Lancets lancets Use as instructedUSE TO CHECK BLOOD GLUCOSE 3 TIMES DAILY 300 each 2   acetaminophen (TYLENOL) 500 MG tablet Take 1,000 mg by mouth 2 (two) times daily at 10 AM and 5 PM.      alfuzosin (UROXATRAL) 10 MG 24 hr tablet TAKE 1 TABLET BY MOUTH  DAILY WITH BREAKFAST 90 tablet 3   aspirin EC 325 MG tablet Take 650 mg by mouth daily.      blood glucose meter kit and supplies KIT Dispense Accu-Chek Aviva plus. Check once daily as directed. For ICD 10 code E11.9. 1 each 0   Calcium-Magnesium-Zinc 167-83-8 MG TABS Take 4 tablets by mouth daily.     chlorthalidone (HYGROTON) 50 MG tablet TAKE 1 TABLET BY MOUTH  DAILY 90 tablet 3   Continuous Blood Gluc  Receiver (FREESTYLE LIBRE 2 READER) DEVI Use at least every 8 hours to read your blood glucose 1 each 0   Continuous Blood Gluc Sensor (FREESTYLE LIBRE 14 DAY SENSOR) MISC Apply every 14 days, used to check glucose at least 3 times a day, diagnosis code E11.9 6 each 1   Cranberry 125 MG TABS Take 4 tablets by mouth daily.     diphenhydrAMINE (BENADRYL) 25 MG tablet Take 50 mg by mouth at bedtime as needed for sleep.     empagliflozin (JARDIANCE) 25 MG TABS tablet Take 1 tablet (25 mg total) by mouth daily. 90 tablet 2   glipiZIDE (GLUCOTROL) 10 MG tablet TAKE 2 TABLETS BY MOUTH  TWICE DAILY 360 tablet 3   glucose blood (ACCU-CHEK AVIVA PLUS) test strip USE TO CHECK BLOOD GLUCOSE 3 TIMES DAILY 300 each 3   insulin NPH Human (HUMULIN N) 100 UNIT/ML injection INJECT SUBCUTANEOUSLY 20  UNITS IN THE MORNING AND 15 UNITS AT BEDTIME 40 mL 3   Insulin Syringe-Needle U-100 (BD INSULIN SYRINGE U/F) 31G X 5/16" 0.3 ML MISC USE TWICE DAILY WITH  INSULIN 180 each 1   meloxicam (MOBIC) 7.5 MG tablet TAKE 1 TABLET BY MOUTH EVERY DAY 30 tablet 0   metFORMIN (GLUCOPHAGE-XR) 500 MG 24 hr tablet TAKE 4 TABLETS BY MOUTH  ONCE DAILY WITH BREAKFAST 360 tablet 3   Multiple Vitamins-Minerals (MULTIVITAMIN WITH MINERALS) tablet Take  2 tablets by mouth daily.     Omega-3 Fatty Acids (FISH OIL) 1000 MG CPDR Take 4 capsules by mouth every other day.     OZEMPIC, 0.25 OR 0.5 MG/DOSE, 2 MG/1.5ML SOPN INJECT SUBCUTANEOUSLY  0.25MG ONCE WEEKLY FOR 28  DAYS THEN 0.5MG ONCE WEEKLY 4.5 mL 3   rosuvastatin (CRESTOR) 20 MG tablet TAKE 1 TABLET BY MOUTH  DAILY 90 tablet 3   triamcinolone cream (KENALOG) 0.1 % APPLY CREAM EXTERNALLY 2  TIMES DAILY AS NEEDED 45 g 0   Vitamin D, Ergocalciferol, (DRISDOL) 1.25 MG (50000 UNIT) CAPS capsule Take 1 capsule (50,000 Units total) by mouth every 7 (seven) days. 8 capsule 0   No current facility-administered medications on file prior to visit.     ROS see history of present  illness  Objective  Physical Exam Vitals:   10/28/21 1616  BP: 120/70  Pulse: 77  Temp: 98.1 F (36.7 C)  SpO2: 98%    BP Readings from Last 3 Encounters:  10/28/21 120/70  08/03/21 140/80  04/22/21 (!) 150/70   Wt Readings from Last 3 Encounters:  10/28/21 244 lb 12.8 oz (111 kg)  08/03/21 244 lb 9.6 oz (110.9 kg)  04/22/21 248 lb 6.4 oz (112.7 kg)    Physical Exam Constitutional:      General: He is not in acute distress.    Appearance: He is not diaphoretic.  Cardiovascular:     Rate and Rhythm: Normal rate and regular rhythm.     Heart sounds: Normal heart sounds.  Pulmonary:     Effort: Pulmonary effort is normal.     Breath sounds: Normal breath sounds.  Skin:    General: Skin is warm and dry.  Neurological:     Mental Status: He is alert.   Diabetic Foot Exam - Simple   Simple Foot Form  10/28/2021  4:10 PM  Visual Inspection See comments: Yes Sensation Testing See comments: Yes Pulse Check Posterior Tibialis and Dorsalis pulse intact bilaterally: Yes Comments Slight decrease in sensation to monofilament testing, sensation to light touch intact bilaterally, onychomycosis bilaterally, no other deformities, ulcerations, or skin breakdown      Assessment/Plan: Please see individual problem list.  Problem List Items Addressed This Visit     Essential hypertension - Primary    Adequately controlled for age.  He will continue chlorthalidone 50 mg once daily.  Check lab work.      Relevant Orders   BASIC METABOLIC PANEL WITH GFR (Completed)   Leg weakness, bilateral    Chronic issue.  Stable.  He will monitor given inability to tolerate PT.      Type 2 diabetes mellitus with diabetic neuropathy, unspecified (HCC)    Check A1c.  He will continue Ozempic 0.5 mg once weekly, metformin XR 2000 mg once daily, insulin NPH 25 units in the morning and 15 units at bedtime, glipizide 20 mg twice daily, and Jardiance 25 mg once daily.      Relevant Orders    HgB A1c (Completed)     Health Maintenance: He was encouraged to schedule follow-up with his eye doctor.  Return in about 3 months (around 01/28/2022).  This visit occurred during the SARS-CoV-2 public health emergency.  Safety protocols were in place, including screening questions prior to the visit, additional usage of staff PPE, and extensive cleaning of exam room while observing appropriate contact time as indicated for disinfecting solutions.    Patrick Rumps, MD [place]

## 2021-10-31 NOTE — Assessment & Plan Note (Signed)
Check A1c.  He will continue Ozempic 0.5 mg once weekly, metformin XR 2000 mg once daily, insulin NPH 25 units in the morning and 15 units at bedtime, glipizide 20 mg twice daily, and Jardiance 25 mg once daily.

## 2021-12-06 ENCOUNTER — Other Ambulatory Visit: Payer: Self-pay | Admitting: Family Medicine

## 2021-12-06 ENCOUNTER — Ambulatory Visit: Payer: Medicare Other | Admitting: Family Medicine

## 2021-12-07 ENCOUNTER — Ambulatory Visit: Payer: Medicare Other | Admitting: Family Medicine

## 2022-01-23 ENCOUNTER — Telehealth: Payer: Self-pay | Admitting: Family Medicine

## 2022-01-23 MED ORDER — INSULIN NPH (HUMAN) (ISOPHANE) 100 UNIT/ML ~~LOC~~ SUSP: SUBCUTANEOUS | 0 refills | Status: DC

## 2022-01-23 NOTE — Telephone Encounter (Addendum)
Pt called in requesting refill for an emergency bottle of medication (insulin NPH Human (HUMULIN N) 100 UNIT/ML injection). Pt stated that he thought he had an extra bottle and now he doesn't know what happen to the bottle. Pt stated that he have 1/5 of a bottle but the medication is expired. Pt requesting emergency script to be sent to [place] [facility]. Pt requesting callback

## 2022-01-23 NOTE — Telephone Encounter (Signed)
Humulin N sent to pharmacy.

## 2022-01-30 ENCOUNTER — Telehealth: Payer: Self-pay

## 2022-01-30 NOTE — Telephone Encounter (Signed)
I received a fax from [facility]  wanting a refill on Lancets, I called the patient to see if he needed this refill because he now has the libre to check his sugars and I received a busy signal at his home number  Jannae Fagerstrom,cma

## 2022-02-03 ENCOUNTER — Other Ambulatory Visit: Payer: Self-pay

## 2022-02-03 ENCOUNTER — Encounter: Payer: Self-pay | Admitting: Family Medicine

## 2022-02-03 ENCOUNTER — Ambulatory Visit (INDEPENDENT_AMBULATORY_CARE_PROVIDER_SITE_OTHER): Payer: Medicare Other | Admitting: Family Medicine

## 2022-02-03 VITALS — BP 140/70 | HR 93 | Temp 98.1°F | Ht 68.0 in | Wt 247.4 lb

## 2022-02-03 DIAGNOSIS — Z794 Long term (current) use of insulin: Secondary | ICD-10-CM

## 2022-02-03 DIAGNOSIS — E1140 Type 2 diabetes mellitus with diabetic neuropathy, unspecified: Secondary | ICD-10-CM | POA: Diagnosis not present

## 2022-02-03 DIAGNOSIS — I10 Essential (primary) hypertension: Secondary | ICD-10-CM | POA: Diagnosis not present

## 2022-02-03 DIAGNOSIS — R29898 Other symptoms and signs involving the musculoskeletal system: Secondary | ICD-10-CM

## 2022-02-03 DIAGNOSIS — K219 Gastro-esophageal reflux disease without esophagitis: Secondary | ICD-10-CM | POA: Diagnosis not present

## 2022-02-03 LAB — BASIC METABOLIC PANEL WITH GFR
BUN/Creatinine Ratio: 27 (calc) — ABNORMAL HIGH (ref 6–22)
BUN: 28 mg/dL — ABNORMAL HIGH (ref 7–25)
CO2: 21 mmol/L (ref 20–32)
Calcium: 10.7 mg/dL — ABNORMAL HIGH (ref 8.6–10.3)
Chloride: 100 mmol/L (ref 98–110)
Creat: 1.04 mg/dL (ref 0.70–1.28)
Glucose, Bld: 136 mg/dL — ABNORMAL HIGH (ref 65–99)
Potassium: 3.5 mmol/L (ref 3.5–5.3)
Sodium: 138 mmol/L (ref 135–146)
eGFR: 73 mL/min/{1.73_m2} (ref >=60)

## 2022-02-03 LAB — POCT GLYCOSYLATED HEMOGLOBIN (HGB A1C): Hemoglobin A1C: 6.7 % — AB (ref 4.0–5.6)

## 2022-02-03 MED ORDER — FAMOTIDINE 20 MG PO TABS: 20.0000 mg | ORAL_TABLET | Freq: Every day | ORAL | 1 refills | Status: DC

## 2022-02-03 MED ORDER — ACCU-CHEK SOFTCLIX LANCETS MISC: 2 refills | Status: DC

## 2022-02-03 NOTE — Assessment & Plan Note (Signed)
A1c is well controlled.  He will continue Ozempic 0.5 mg weekly, metformin XR 2000 mg daily NPH 25 units in the morning and 15 units at bedtime, glipizide 20 g twice daily, and Jardiance 25 mg daily.

## 2022-02-03 NOTE — Progress Notes (Signed)
[name], MD Phone: [phone]  Patrick Patel is a 81 y.o. male who presents today for f/u.  DIABETES Disease Monitoring: Blood Sugar ranges-has been higher Polyuria/phagia/dipsia- no      Optho- due Medications: Compliance- taking glipizide, jardiance, NPH 25 u am and 15 u pm , metformin, ozempic Hypoglycemic symptoms- no   HYPERTENSION Disease Monitoring Home BP Monitoring 140-152/<80 Chest pain- much improved compared to years ago    Dyspnea- no Medications Compliance-  taking chlorthalidone.   Edema- no BMET    Component Value Date/Time   NA 138 10/28/2021 1644   K 3.7 10/28/2021 1644   CL 101 10/28/2021 1644   CO2 26 10/28/2021 1644   GLUCOSE 132 (H) 10/28/2021 1644   BUN 24 10/28/2021 1644   CREATININE 0.98 10/28/2021 1644   CALCIUM 10.0 10/28/2021 1644   GFRNONAA >60 12/19/2018 0539   GFRAA >60 12/19/2018 0539   Leg weakness: Patient has chronic issues with feeling as though his legs are weak.  He notes no swelling.  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  He did physical therapy previously and notes it hurt his muscles so much that he never went back.  He does not do any specific exercise at this time.   Social History   Tobacco Use  Smoking Status Former  Smokeless Tobacco Current   Types: Snuff    Current Outpatient Medications on File Prior to Visit  Medication Sig Dispense Refill   acetaminophen (TYLENOL) 500 MG tablet Take 1,000 mg by mouth 2 (two) times daily at 10 AM and 5 PM.      alfuzosin (UROXATRAL) 10 MG 24 hr tablet TAKE 1 TABLET BY MOUTH  DAILY WITH BREAKFAST 90 tablet 3   aspirin EC 325 MG tablet Take 650 mg by mouth daily.      blood glucose meter kit and supplies KIT Dispense Accu-Chek Aviva plus. Check once daily as directed. For ICD 10 code E11.9. 1 each 0   Calcium-Magnesium-Zinc 167-83-8 MG TABS Take 4 tablets by mouth daily.     chlorthalidone (HYGROTON) 50 MG tablet TAKE 1 TABLET BY MOUTH  DAILY 90 tablet 3   Continuous Blood Gluc  Receiver (FREESTYLE LIBRE 2 READER) DEVI Use at least every 8 hours to read your blood glucose 1 each 0   Continuous Blood Gluc Sensor (FREESTYLE LIBRE 14 DAY SENSOR) MISC Apply every 14 days, used to check glucose at least 3 times a day, diagnosis code E11.9 6 each 1   Cranberry 125 MG TABS Take 4 tablets by mouth daily.     diphenhydrAMINE (BENADRYL) 25 MG tablet Take 50 mg by mouth at bedtime as needed for sleep.     empagliflozin (JARDIANCE) 25 MG TABS tablet Take 1 tablet (25 mg total) by mouth daily. 90 tablet 2   glipiZIDE (GLUCOTROL) 10 MG tablet TAKE 2 TABLETS BY MOUTH  TWICE DAILY 360 tablet 3   glucose blood (ACCU-CHEK AVIVA PLUS) test strip USE TO CHECK BLOOD GLUCOSE 3 TIMES DAILY 300 each 3   insulin NPH Human (HUMULIN N) 100 UNIT/ML injection INJECT SUBCUTANEOUSLY 20  UNITS IN THE MORNING AND 15 UNITS AT BEDTIME 40 mL 0   Insulin Syringe-Needle U-100 (BD INSULIN SYRINGE U/F) 31G X 5/16" 0.3 ML MISC USE TWICE DAILY WITH  INSULIN 180 each 1   meloxicam (MOBIC) 7.5 MG tablet TAKE 1 TABLET BY MOUTH EVERY DAY 30 tablet 0   metFORMIN (GLUCOPHAGE-XR) 500 MG 24 hr tablet TAKE 4 TABLETS BY MOUTH  ONCE  DAILY WITH BREAKFAST 360 tablet 3   Multiple Vitamins-Minerals (MULTIVITAMIN WITH MINERALS) tablet Take 2 tablets by mouth daily.     Omega-3 Fatty Acids (FISH OIL) 1000 MG CPDR Take 4 capsules by mouth every other day.     OZEMPIC, 0.25 OR 0.5 MG/DOSE, 2 MG/1.5ML SOPN INJECT SUBCUTANEOUSLY  0.25MG ONCE WEEKLY FOR 28  DAYS THEN 0.5MG ONCE WEEKLY 4.5 mL 3   rosuvastatin (CRESTOR) 20 MG tablet TAKE 1 TABLET BY MOUTH  DAILY 90 tablet 3   triamcinolone cream (KENALOG) 0.1 % APPLY CREAM EXTERNALLY 2  TIMES DAILY AS NEEDED 45 g 0   Vitamin D, Ergocalciferol, (DRISDOL) 1.25 MG (50000 UNIT) CAPS capsule Take 1 capsule (50,000 Units total) by mouth every 7 (seven) days. 8 capsule 0   No current facility-administered medications on file prior to visit.     ROS see history of present  illness  Objective  Physical Exam Vitals:   02/03/22 1605  BP: 140/70  Pulse: 93  Temp: 98.1 F (36.7 C)  SpO2: 97%    BP Readings from Last 3 Encounters:  02/03/22 140/70  10/28/21 120/70  08/03/21 140/80   Wt Readings from Last 3 Encounters:  02/03/22 247 lb 6.4 oz (112.2 kg)  10/28/21 244 lb 12.8 oz (111 kg)  08/03/21 244 lb 9.6 oz (110.9 kg)    Physical Exam Constitutional:      General: He is not in acute distress.    Appearance: He is not diaphoretic.  Cardiovascular:     Rate and Rhythm: Normal rate and regular rhythm.     Heart sounds: Normal heart sounds.  Pulmonary:     Effort: Pulmonary effort is normal.     Breath sounds: Normal breath sounds.  Musculoskeletal:     Right lower leg: No edema.     Left lower leg: No edema.     Comments: Varicose veins bilateral lower legs, no tenderness or signs of superficial thrombophlebitis  Skin:    General: Skin is warm and dry.  Neurological:     Mental Status: He is alert.     Comments: 5/5 strength bilateral quads, hamstrings, plantarflexion, and dorsiflexion     Assessment/Plan: Please see individual problem list.  Problem List Items Addressed This Visit     Essential hypertension    Adequately controlled for his age.  He will continue chlorthalidone 50 mg once daily.      GERD (gastroesophageal reflux disease)    He has had some indigestion after going on Ozempic.  This only occurs 1-2 times a week.  He has been taking 6-10 Tums nightly to prevent this.  We will check a BMP to evaluate his calcium level.  He will start on Pepcid.      Relevant Medications   famotidine (PEPCID) 20 MG tablet   Leg weakness, bilateral    This is a chronic ongoing issue.  His strength exam today is acceptable.  Likely this is related to deconditioning.  I discussed going to physical therapy again though he declines this.  He was given exercises to complete at home.      Type 2 diabetes mellitus with diabetic neuropathy,  unspecified (HCC) - Primary    A1c is well controlled.  He will continue Ozempic 0.5 mg weekly, metformin XR 2000 mg daily NPH 25 units in the morning and 15 units at bedtime, glipizide 20 g twice daily, and Jardiance 25 mg daily.      Relevant Orders   POCT HgB A1C (  Completed)   BASIC METABOLIC PANEL WITH GFR      Return in about 3 months (around 05/03/2022).  This visit occurred during the SARS-CoV-2 public health emergency.  Safety protocols were in place, including screening questions prior to the visit, additional usage of staff PPE, and extensive cleaning of exam room while observing appropriate contact time as indicated for disinfecting solutions.    [name], MD [place]

## 2022-02-03 NOTE — Assessment & Plan Note (Signed)
This is a chronic ongoing issue.  His strength exam today is acceptable.  Likely this is related to deconditioning.  I discussed going to physical therapy again though he declines this.  He was given exercises to complete at home.

## 2022-02-03 NOTE — Assessment & Plan Note (Signed)
Adequately controlled for his age.  He will continue chlorthalidone 50 mg once daily.

## 2022-02-03 NOTE — Patient Instructions (Signed)
Nice to see you. Please use the Pepcid nightly to see if that helps with the reflux. Please try the exercises for your legs.  Knee Exercises Ask your health care provider which exercises are safe for you. Do exercises exactly as told by your health care provider and adjust them as directed. It is normal to feel mild stretching, pulling, tightness, or discomfort as you do these exercises. Stop right away if you feel sudden pain or your pain gets worse. Do not begin these exercises until told by your health care provider. Stretching and range-of-motion exercises These exercises warm up your muscles and joints and improve the movement and flexibility of your knee. These exercises also help to relieve pain and swelling. Knee extension, prone  Lie on your abdomen (prone position) on a bed. Place your left / right knee just beyond the edge of the surface so your knee is not on the bed. You can put a towel under your left / right thigh just above your kneecap for comfort. Relax your leg muscles and allow gravity to straighten your knee (extension). You should feel a stretch behind your left / right knee. Hold this position for __________ seconds. Scoot up so your knee is supported between repetitions. Repeat __________ times. Complete this exercise __________ times a day. Knee flexion, active  Lie on your back with both legs straight. If this causes back discomfort, bend your left / right knee so your foot is flat on the floor. Slowly slide your left / right heel back toward your buttocks. Stop when you feel a gentle stretch in the front of your knee or thigh (flexion). Hold this position for __________ seconds. Slowly slide your left / right heel back to the starting position. Repeat __________ times. Complete this exercise __________ times a day. Quadriceps stretch, prone  Lie on your abdomen on a firm surface, such as a bed or padded floor. Bend your left / right knee and hold your ankle. If  you cannot reach your ankle or pant leg, loop a belt around your foot and grab the belt instead. Gently pull your heel toward your buttocks. Your knee should not slide out to the side. You should feel a stretch in the front of your thigh and knee (quadriceps). Hold this position for __________ seconds. Repeat __________ times. Complete this exercise __________ times a day. Hamstring, supine  Lie on your back (supine position). Loop a belt or towel over the ball of your left / right foot. The ball of your foot is on the walking surface, right under your toes. Straighten your left / right knee and slowly pull on the belt to raise your leg until you feel a gentle stretch behind your knee (hamstring). Do not let your knee bend while you do this. Keep your other leg flat on the floor. Hold this position for __________ seconds. Repeat __________ times. Complete this exercise __________ times a day. Strengthening exercises These exercises build strength and endurance in your knee. Endurance is the ability to use your muscles for a long time, even after they get tired. Quadriceps, isometric This exercise strengthens the muscles in front of your thigh (quadriceps) without moving your knee joint (isometric). Lie on your back with your left / right leg extended and your other knee bent. Put a rolled towel or small pillow under your knee if told by your health care provider. Slowly tense the muscles in the front of your left / right thigh. You should see your kneecap  slide up toward your hip or see increased dimpling just above the knee. This motion will push the back of the knee toward the floor. For __________ seconds, hold the muscle as tight as you can without increasing your pain. Relax the muscles slowly and completely. Repeat __________ times. Complete this exercise __________ times a day. Straight leg raises This exercise strengthens the muscles in front of your thigh (quadriceps) and the muscles  that move your hips (hip flexors). Lie on your back with your left / right leg extended and your other knee bent. Tense the muscles in the front of your left / right thigh. You should see your kneecap slide up or see increased dimpling just above the knee. Your thigh may even shake a bit. Keep these muscles tight as you raise your leg 4-6 inches (10-15 cm) off the floor. Do not let your knee bend. Hold this position for __________ seconds. Keep these muscles tense as you lower your leg. Relax your muscles slowly and completely after each repetition. Repeat __________ times. Complete this exercise __________ times a day. Hamstring, isometric  Lie on your back on a firm surface. Bend your left / right knee about __________ degrees. Dig your left / right heel into the surface as if you are trying to pull it toward your buttocks. Tighten the muscles in the back of your thighs (hamstring) to "dig" as hard as you can without increasing any pain. Hold this position for __________ seconds. Release the tension gradually and allow your muscles to relax completely for __________ seconds after each repetition. Repeat __________ times. Complete this exercise __________ times a day. Hamstring curls If told by your health care provider, do this exercise while wearing ankle weights. Begin with __________lb / kg weights. Then increase the weight by 1 lb (0.5 kg) increments. Do not wear ankle weights that are more than __________lb / kg. Lie on your abdomen with your legs straight. Bend your left / right knee as far as you can without feeling pain. Keep your hips flat against the floor. Hold this position for __________ seconds. Slowly lower your leg to the starting position. Repeat __________ times. Complete this exercise __________ times a day. Squats This exercise strengthens the muscles in front of your thigh and knee (quadriceps). Stand in front of a table, with your feet and knees pointing straight  ahead. You may rest your hands on the table for balance but not for support. Slowly bend your knees and lower your hips like you are going to sit in a chair. Keep your weight over your heels, not over your toes. Keep your lower legs upright so they are parallel with the table legs. Do not let your hips go lower than your knees. Do not bend lower than told by your health care provider. If your knee pain increases, do not bend as low. Hold the squat position for __________ seconds. Slowly push with your legs to return to standing. Do not use your hands to pull yourself to standing. Repeat __________ times. Complete this exercise __________ times a day. Wall slides This exercise strengthens the muscles in front of your thigh and knee (quadriceps). Lean your back against a smooth wall or door, and walk your feet out 18-24 inches (46-61 cm) from it. Place your feet hip-width apart. Slowly slide down the wall or door until your knees bend __________ degrees. Keep your knees over your heels, not over your toes. Keep your knees in line with your hips. Hold this  position for __________ seconds. Repeat __________ times. Complete this exercise __________ times a day. Straight leg raises, side-lying This exercise strengthens the muscles that rotate the leg at the hip and move it away from your body (hip abductors). Lie on your side with your left / right leg in the top position. Lie so your head, shoulder, knee, and hip line up. You may bend your bottom knee to help you keep your balance. Roll your hips slightly forward so your hips are stacked directly over each other and your left / right knee is facing forward. Leading with your heel, lift your top leg 4-6 inches (10-15 cm). You should feel the muscles in your outer hip lifting. Do not let your foot drift forward. Do not let your knee roll toward the ceiling. Hold this position for __________ seconds. Slowly return your leg to the starting  position. Let your muscles relax completely after each repetition. Repeat __________ times. Complete this exercise __________ times a day. Straight leg raises, prone This exercise stretches the muscles that move your hips away from the front of the pelvis (hip extensors). Lie on your abdomen on a firm surface. You can put a pillow under your hips if that is more comfortable. Tense the muscles in your buttocks and lift your left / right leg about 4-6 inches (10-15 cm). Keep your knee straight as you lift your leg. Hold this position for __________ seconds. Slowly lower your leg to the starting position. Let your leg relax completely after each repetition. Repeat __________ times. Complete this exercise __________ times a day. This information is not intended to replace advice given to you by your health care provider. Make sure you discuss any questions you have with your health care provider. Document Revised: 08/09/2021 Document Reviewed: 08/09/2021 Elsevier Patient Education  2022 [employer].

## 2022-02-03 NOTE — Assessment & Plan Note (Signed)
He has had some indigestion after going on Ozempic.  This only occurs 1-2 times a week.  He has been taking 6-10 Tums nightly to prevent this.  We will check a BMP to evaluate his calcium level.  He will start on Pepcid.

## 2022-02-06 NOTE — Addendum Note (Signed)
Addended by: Charlyne Mom D on: 02/06/2022 04:04 PM   Modules accepted: Orders

## 2022-02-15 ENCOUNTER — Other Ambulatory Visit: Payer: Medicare Other

## 2022-02-18 ENCOUNTER — Other Ambulatory Visit: Payer: Self-pay | Admitting: Family Medicine

## 2022-02-27 ENCOUNTER — Other Ambulatory Visit: Payer: Self-pay | Admitting: Family Medicine

## 2022-02-27 ENCOUNTER — Other Ambulatory Visit: Payer: Self-pay

## 2022-02-27 ENCOUNTER — Other Ambulatory Visit (INDEPENDENT_AMBULATORY_CARE_PROVIDER_SITE_OTHER): Payer: Medicare Other

## 2022-02-28 LAB — CALCIUM: Calcium: 9.4 mg/dL (ref 8.4–10.5)

## 2022-04-06 ENCOUNTER — Other Ambulatory Visit: Payer: Self-pay | Admitting: Family Medicine

## 2022-04-06 DIAGNOSIS — E1140 Type 2 diabetes mellitus with diabetic neuropathy, unspecified: Secondary | ICD-10-CM

## 2022-04-23 ENCOUNTER — Other Ambulatory Visit: Payer: Self-pay | Admitting: Family Medicine

## 2022-05-10 ENCOUNTER — Ambulatory Visit (INDEPENDENT_AMBULATORY_CARE_PROVIDER_SITE_OTHER): Payer: Medicare Other | Admitting: Family Medicine

## 2022-05-10 ENCOUNTER — Telehealth: Payer: Self-pay | Admitting: Family Medicine

## 2022-05-10 ENCOUNTER — Encounter: Payer: Self-pay | Admitting: Family Medicine

## 2022-05-10 DIAGNOSIS — E1140 Type 2 diabetes mellitus with diabetic neuropathy, unspecified: Secondary | ICD-10-CM | POA: Diagnosis not present

## 2022-05-10 DIAGNOSIS — E785 Hyperlipidemia, unspecified: Secondary | ICD-10-CM

## 2022-05-10 DIAGNOSIS — R221 Localized swelling, mass and lump, neck: Secondary | ICD-10-CM | POA: Insufficient documentation

## 2022-05-10 DIAGNOSIS — I10 Essential (primary) hypertension: Secondary | ICD-10-CM

## 2022-05-10 DIAGNOSIS — Z794 Long term (current) use of insulin: Secondary | ICD-10-CM | POA: Diagnosis not present

## 2022-05-10 NOTE — Assessment & Plan Note (Signed)
Adequately controlled for age. Continue chlorthalidone 50 mg daily.

## 2022-05-10 NOTE — Progress Notes (Signed)
Virtual Visit via telephone Note  This visit type was conducted due to national recommendations for restrictions regarding the COVID-19 pandemic (e.g. social distancing).  This format is felt to be most appropriate for this patient at this time.  All issues noted in this document were discussed and addressed.  No physical exam was performed (except for noted visual exam findings with Video Visits).   I connected with Patrick Patel today at  4:00 PM EDT by telephone and verified that I am speaking with the correct person using two identifiers. Location patient: home Location provider: home office Persons participating in the virtual visit: patient, provider  I discussed the limitations, risks, security and privacy concerns of performing an evaluation and management service by telephone and the availability of in person appointments. I also discussed with the patient that there may be a patient responsible charge related to this service. The patient expressed understanding and agreed to proceed.  Interactive audio and video telecommunications were attempted between this provider and patient, however failed, due to patient having technical difficulties OR patient did not have access to video capability.  We continued and completed visit with audio only.   Reason for visit: f/u.  HPI: HYPERTENSION Disease Monitoring: Blood pressure range-140s/60s Chest pain- no change to chronic CP     Dyspnea- no Medications: Compliance- taking chlorthalidone   Edema- intermittent, swells less when he gets out and walks  DIABETES Disease Monitoring: Blood Sugar ranges-148-260       Optho- due Medications: Compliance- taking glipizide, NPH 25 u am, 20 u pm, jardiance, metformin, stopped ozempic several weeks ago due to feeling an odd sensation in left neck above his adams apple. He discontinue the Ozempic given concern for thyroid cancer.  He notes no personal or family history of medullary thyroid cancer.   He wonders if he can have a calcitonin checked.  Hypoglycemic symptoms- no  HYPERLIPIDEMIA Disease Monitoring: See symptoms for Hypertension Medications: Compliance- taking crestor Right upper quadrant pain- no    GERD: pepcid has helped. Takes maalox as well. Has not had any reflux since he has been on the pepcid.   ROS: See pertinent positives and negatives per HPI.  Past Medical History:  Diagnosis Date   Diabetes ([place])    Hyperlipidemia    Hypertension     No past surgical history on file.  Family History  Problem Relation Age of Onset   Breast cancer Mother    Thyroid cancer Mother     SOCIAL HX: former smoker   Current Outpatient Medications:    ACCU-CHEK AVIVA PLUS test strip, USE TO CHECK BLOOD GLUCOSE  3 TIMES DAILY, Disp: 300 strip, Rfl: 2   Accu-Chek Softclix Lancets lancets, Use as instructedUSE TO CHECK BLOOD GLUCOSE 3 TIMES DAILY, Disp: 300 each, Rfl: 2   acetaminophen (TYLENOL) 500 MG tablet, Take 1,000 mg by mouth 2 (two) times daily at 10 AM and 5 PM. , Disp: , Rfl:    alfuzosin (UROXATRAL) 10 MG 24 hr tablet, TAKE 1 TABLET BY MOUTH  DAILY WITH BREAKFAST, Disp: 90 tablet, Rfl: 3   aspirin EC 325 MG tablet, Take 650 mg by mouth daily. , Disp: , Rfl:    blood glucose meter kit and supplies KIT, Dispense Accu-Chek Aviva plus. Check once daily as directed. For ICD 10 code E11.9., Disp: 1 each, Rfl: 0   Calcium-Magnesium-Zinc 167-83-8 MG TABS, Take 4 tablets by mouth daily., Disp: , Rfl:    chlorthalidone (HYGROTON) 50 MG tablet, TAKE  1 TABLET BY MOUTH  DAILY, Disp: 90 tablet, Rfl: 3   Continuous Blood Gluc Receiver (FREESTYLE LIBRE 2 READER) DEVI, Use at least every 8 hours to read your blood glucose, Disp: 1 each, Rfl: 0   Continuous Blood Gluc Sensor (FREESTYLE LIBRE 14 DAY SENSOR) MISC, Apply every 14 days, used to check glucose at least 3 times a day, diagnosis code E11.9, Disp: 6 each, Rfl: 1   Cranberry 125 MG TABS, Take 4 tablets by mouth daily., Disp: ,  Rfl:    diphenhydrAMINE (BENADRYL) 25 MG tablet, Take 50 mg by mouth at bedtime as needed for sleep., Disp: , Rfl:    famotidine (PEPCID) 20 MG tablet, TAKE 1 TABLET BY MOUTH AT  BEDTIME, Disp: 100 tablet, Rfl: 2   glipiZIDE (GLUCOTROL) 10 MG tablet, TAKE 2 TABLETS BY MOUTH  TWICE DAILY, Disp: 360 tablet, Rfl: 3   insulin NPH Human (HUMULIN N) 100 UNIT/ML injection, INJECT SUBCUTANEOUSLY 20  UNITS IN THE MORNING AND 15 UNITS AT BEDTIME, Disp: 40 mL, Rfl: 3   Insulin Syringe-Needle U-100 (BD INSULIN SYRINGE U/F) 31G X 5/16" 0.3 ML MISC, USE TWICE DAILY WITH  INSULIN, Disp: 180 each, Rfl: 1   JARDIANCE 25 MG TABS tablet, TAKE 1 TABLET BY MOUTH  DAILY, Disp: 100 tablet, Rfl: 2   meloxicam (MOBIC) 7.5 MG tablet, TAKE 1 TABLET BY MOUTH EVERY DAY, Disp: 30 tablet, Rfl: 0   metFORMIN (GLUCOPHAGE-XR) 500 MG 24 hr tablet, TAKE 4 TABLETS BY MOUTH  ONCE DAILY WITH BREAKFAST, Disp: 360 tablet, Rfl: 3   Multiple Vitamins-Minerals (MULTIVITAMIN WITH MINERALS) tablet, Take 2 tablets by mouth daily., Disp: , Rfl:    Omega-3 Fatty Acids (FISH OIL) 1000 MG CPDR, Take 4 capsules by mouth every other day., Disp: , Rfl:    rosuvastatin (CRESTOR) 20 MG tablet, TAKE 1 TABLET BY MOUTH  DAILY, Disp: 90 tablet, Rfl: 3   triamcinolone cream (KENALOG) 0.1 %, APPLY CREAM EXTERNALLY 2  TIMES DAILY AS NEEDED, Disp: 45 g, Rfl: 0   Vitamin D, Ergocalciferol, (DRISDOL) 1.25 MG (50000 UNIT) CAPS capsule, Take 1 capsule (50,000 Units total) by mouth every 7 (seven) days., Disp: 8 capsule, Rfl: 0   OZEMPIC, 0.25 OR 0.5 MG/DOSE, 2 MG/1.5ML SOPN, INJECT SUBCUTANEOUSLY  0.25MG ONCE WEEKLY FOR 28  DAYS THEN 0.5MG ONCE WEEKLY (Patient not taking: Reported on 05/10/2022), Disp: 4.5 mL, Rfl: 3  EXAM:  VITALS per patient if applicable:  GENERAL: alert, oriented, appears well and in no acute distress  HEENT: atraumatic, conjunttiva clear, no obvious abnormalities on inspection of external nose and ears  NECK: normal movements of the head  and neck  LUNGS: on inspection no signs of respiratory distress, breathing rate appears normal, no obvious gross SOB, gasping or wheezing  CV: no obvious cyanosis  MS: moves all visible extremities without noticeable abnormality  PSYCH/NEURO: pleasant and cooperative, no obvious depression or anxiety, speech and thought processing grossly intact  ASSESSMENT AND PLAN:  Discussed the following assessment and plan:  Problem List Items Addressed This Visit     Essential hypertension (Chronic)    Adequately controlled for age. Continue chlorthalidone 50 mg daily.        Relevant Orders   Comp Met ([facility])   Hyperlipidemia (Chronic)    Check lipid panel.  He will continue Crestor 20 mg daily.       Relevant Orders   Comp Met (CMET)   Lipid panel   Type 2 diabetes mellitus with diabetic neuropathy,  unspecified ([place]) (Chronic)    Check A1c.  He will continue metformin XR 2000 mg daily, NPH insulin 25 units in the morning and 20 units at bedtime, glipizide 20 mg twice daily, and Jardiance 25 mg daily.  Discussed the potential for restarting the Ozempic pending his ultrasounds.       Relevant Orders   Comp Met (CMET)   Hemoglobin A1c   Neck nodule    Check thyroid ultrasound to rule out thyroid nodule given that he has been on Ozempic.  Check soft tissue ultrasound given the location of the area of concern.  Discussed that the calcitonin level may follow the finding of a thyroid nodule though is not typically the initial test in evaluating for possible thyroid cancer.       Relevant Orders   US THYROID   US Soft Tissue Head/Neck (NON-THYROID)    No follow-ups on file.   I discussed the assessment and treatment plan with the patient. The patient was provided an opportunity to ask questions and all were answered. The patient agreed with the plan and demonstrated an understanding of the instructions.   The patient was advised to call back or seek an in-person evaluation if the  symptoms worsen or if the condition fails to improve as anticipated.  I provided 23 minutes of non-face-to-face time during this encounter.   [name], MD

## 2022-05-10 NOTE — Assessment & Plan Note (Signed)
Check A1c.  He will continue metformin XR 2000 mg daily, NPH insulin 25 units in the morning and 20 units at bedtime, glipizide 20 mg twice daily, and Jardiance 25 mg daily.  Discussed the potential for restarting the Ozempic pending his ultrasounds.

## 2022-05-10 NOTE — Assessment & Plan Note (Signed)
Check thyroid ultrasound to rule out thyroid nodule given that he has been on Ozempic.  Check soft tissue ultrasound given the location of the area of concern.  Discussed that the calcitonin level may follow the finding of a thyroid nodule though is not typically the initial test in evaluating for possible thyroid cancer.

## 2022-05-10 NOTE — Telephone Encounter (Signed)
Lft pt vm to call ofc to US's. thanks

## 2022-05-10 NOTE — Assessment & Plan Note (Signed)
Check lipid panel.  He will continue Crestor 20 mg daily.

## 2022-05-11 DIAGNOSIS — R590 Localized enlarged lymph nodes: Secondary | ICD-10-CM | POA: Diagnosis not present

## 2022-05-15 DIAGNOSIS — R590 Localized enlarged lymph nodes: Secondary | ICD-10-CM | POA: Diagnosis not present

## 2022-05-17 ENCOUNTER — Ambulatory Visit
Admission: RE | Admit: 2022-05-17 | Discharge: 2022-05-17 | Disposition: A | Discharge status: Home / Self Care | Payer: Medicare Other | Source: Ambulatory Visit | Attending: Family Medicine | Admitting: Family Medicine

## 2022-05-17 DIAGNOSIS — E079 Disorder of thyroid, unspecified: Secondary | ICD-10-CM | POA: Diagnosis not present

## 2022-05-17 DIAGNOSIS — R221 Localized swelling, mass and lump, neck: Secondary | ICD-10-CM

## 2022-05-17 DIAGNOSIS — R590 Localized enlarged lymph nodes: Secondary | ICD-10-CM | POA: Diagnosis not present

## 2022-05-17 DIAGNOSIS — E041 Nontoxic single thyroid nodule: Secondary | ICD-10-CM | POA: Diagnosis not present

## 2022-05-19 ENCOUNTER — Other Ambulatory Visit: Payer: Self-pay | Admitting: Family Medicine

## 2022-05-19 ENCOUNTER — Other Ambulatory Visit (INDEPENDENT_AMBULATORY_CARE_PROVIDER_SITE_OTHER): Payer: Medicare Other

## 2022-05-19 DIAGNOSIS — E1140 Type 2 diabetes mellitus with diabetic neuropathy, unspecified: Secondary | ICD-10-CM

## 2022-05-19 DIAGNOSIS — E785 Hyperlipidemia, unspecified: Secondary | ICD-10-CM | POA: Diagnosis not present

## 2022-05-19 DIAGNOSIS — I10 Essential (primary) hypertension: Secondary | ICD-10-CM

## 2022-05-19 DIAGNOSIS — Z794 Long term (current) use of insulin: Secondary | ICD-10-CM | POA: Diagnosis not present

## 2022-05-19 DIAGNOSIS — Z125 Encounter for screening for malignant neoplasm of prostate: Secondary | ICD-10-CM

## 2022-05-19 MED ORDER — TRULICITY 0.75 MG/0.5ML ~~LOC~~ SOAJ: 0.7500 mg | SUBCUTANEOUS | 1 refills | Status: DC

## 2022-05-19 NOTE — Addendum Note (Signed)
Addended by: Leeanne Rio on: 05/19/2022 02:33 PM   Modules accepted: Orders

## 2022-05-19 NOTE — Addendum Note (Signed)
Addended by: Clearnce Sorrel on: 05/19/2022 03:45 PM   Modules accepted: Orders

## 2022-05-20 LAB — LIPID PANEL
Chol/HDL Ratio: 3.4 ratio (ref 0.0–5.0)
Cholesterol, Total: 104 mg/dL (ref 100–199)
HDL: 31 mg/dL — ABNORMAL LOW (ref >39)
LDL Chol Calc (NIH): 33 mg/dL (ref 0–99)
Triglycerides: 263 mg/dL — ABNORMAL HIGH (ref 0–149)
VLDL Cholesterol Cal: 40 mg/dL (ref 5–40)

## 2022-05-20 LAB — COMPREHENSIVE METABOLIC PANEL
ALT: 14 IU/L (ref 0–44)
AST: 20 IU/L (ref 0–40)
Albumin/Globulin Ratio: 1.6 (ref 1.2–2.2)
Albumin: 4.3 g/dL (ref 3.7–4.7)
Alkaline Phosphatase: 80 IU/L (ref 44–121)
BUN/Creatinine Ratio: 20 (ref 10–24)
BUN: 18 mg/dL (ref 8–27)
Bilirubin Total: 0.3 mg/dL (ref 0.0–1.2)
CO2: 23 mmol/L (ref 20–29)
Calcium: 9.5 mg/dL (ref 8.6–10.2)
Chloride: 101 mmol/L (ref 96–106)
Creatinine, Ser: 0.89 mg/dL (ref 0.76–1.27)
Globulin, Total: 2.7 g/dL (ref 1.5–4.5)
Glucose: 179 mg/dL — ABNORMAL HIGH (ref 70–99)
Potassium: 4 mmol/L (ref 3.5–5.2)
Sodium: 141 mmol/L (ref 134–144)
Total Protein: 7 g/dL (ref 6.0–8.5)
eGFR: 87 mL/min/{1.73_m2} (ref >59)

## 2022-05-20 LAB — HEMOGLOBIN A1C
Est. average glucose Bld gHb Est-mCnc: 157 mg/dL
Hgb A1c MFr Bld: 7.1 % — ABNORMAL HIGH (ref 4.8–5.6)

## 2022-05-24 ENCOUNTER — Other Ambulatory Visit: Payer: Self-pay

## 2022-05-25 ENCOUNTER — Other Ambulatory Visit: Payer: Self-pay

## 2022-05-25 DIAGNOSIS — G8929 Other chronic pain: Secondary | ICD-10-CM

## 2022-06-15 DIAGNOSIS — R070 Pain in throat: Secondary | ICD-10-CM | POA: Diagnosis not present

## 2022-06-15 DIAGNOSIS — R590 Localized enlarged lymph nodes: Secondary | ICD-10-CM | POA: Diagnosis not present

## 2022-06-19 ENCOUNTER — Other Ambulatory Visit: Payer: Self-pay | Admitting: Unknown Physician Specialty

## 2022-06-19 DIAGNOSIS — R590 Localized enlarged lymph nodes: Secondary | ICD-10-CM

## 2022-06-21 ENCOUNTER — Ambulatory Visit (INDEPENDENT_AMBULATORY_CARE_PROVIDER_SITE_OTHER): Payer: Medicare Other | Admitting: Family Medicine

## 2022-06-21 ENCOUNTER — Encounter: Payer: Self-pay | Admitting: Family Medicine

## 2022-06-21 VITALS — BP 130/70 | HR 89 | Temp 98.3°F | Ht 68.0 in | Wt 247.6 lb

## 2022-06-21 DIAGNOSIS — Z125 Encounter for screening for malignant neoplasm of prostate: Secondary | ICD-10-CM | POA: Diagnosis not present

## 2022-06-21 DIAGNOSIS — Z794 Long term (current) use of insulin: Secondary | ICD-10-CM | POA: Diagnosis not present

## 2022-06-21 DIAGNOSIS — M1711 Unilateral primary osteoarthritis, right knee: Secondary | ICD-10-CM

## 2022-06-21 DIAGNOSIS — Z23 Encounter for immunization: Secondary | ICD-10-CM

## 2022-06-21 DIAGNOSIS — E1140 Type 2 diabetes mellitus with diabetic neuropathy, unspecified: Secondary | ICD-10-CM

## 2022-06-21 DIAGNOSIS — I10 Essential (primary) hypertension: Secondary | ICD-10-CM | POA: Diagnosis not present

## 2022-06-21 MED ORDER — TETANUS-DIPHTHERIA TOXOIDS TD 5-2 LFU IM INJ: 0.5000 mL | INJECTION | Freq: Once | INTRAMUSCULAR | 0 refills | Status: AC

## 2022-06-21 NOTE — Patient Instructions (Signed)
Nice to see you. Please try Voltaren gel over-the-counter to see if that will help with your knee and ankle pain.  I referred you to orthopedics and you should hear from them within 1 to 2 weeks. Please call [facility] to schedule your eye appointment. Please get your tetanus vaccine at the pharmacy.

## 2022-06-21 NOTE — Progress Notes (Signed)
Patrick Rumps, MD Phone: [phone]  Patrick Patel is a 81 y.o. male who presents today for f/u.  DIABETES Disease Monitoring: Blood Sugar ranges-stable Polyuria/phagia/dipsia- no      Optho- due Medications: Compliance- taking glipizide, NPH, jardiance, metformin Hypoglycemic symptoms- no  Hypertension: He is taking chlorthalidone.  Notes no recent chest pain.  Right knee and ankle pain: These things are chronic.  This has been going on for 1 to 1.5 years.  It has gotten worse over the last 3 to 4 months.  He has been taking 3 aspirin in the afternoon and 2 aspirin in the evening.  These are 325 mg tablets.  He has also tried lidocaine topically with mild benefit.  He notes swelling in the knee and ankle.  No injury.   Social History   Tobacco Use  Smoking Status Former  Smokeless Tobacco Current   Types: Snuff    Current Outpatient Medications on File Prior to Visit  Medication Sig Dispense Refill   ACCU-CHEK AVIVA PLUS test strip USE TO CHECK BLOOD GLUCOSE  3 TIMES DAILY 300 strip 2   Accu-Chek Softclix Lancets lancets Use as instructedUSE TO CHECK BLOOD GLUCOSE 3 TIMES DAILY 300 each 2   acetaminophen (TYLENOL) 500 MG tablet Take 1,000 mg by mouth 2 (two) times daily at 10 AM and 5 PM.      alfuzosin (UROXATRAL) 10 MG 24 hr tablet TAKE 1 TABLET BY MOUTH  DAILY WITH BREAKFAST 90 tablet 3   aspirin EC 325 MG tablet Take 650 mg by mouth daily.      blood glucose meter kit and supplies KIT Dispense Accu-Chek Aviva plus. Check once daily as directed. For ICD 10 code E11.9. 1 each 0   Calcium-Magnesium-Zinc 167-83-8 MG TABS Take 4 tablets by mouth daily.     chlorthalidone (HYGROTON) 50 MG tablet TAKE 1 TABLET BY MOUTH  DAILY 90 tablet 3   Continuous Blood Gluc Receiver (FREESTYLE LIBRE 2 READER) DEVI Use at least every 8 hours to read your blood glucose 1 each 0   Continuous Blood Gluc Sensor (FREESTYLE LIBRE 14 DAY SENSOR) MISC Apply every 14 days, used to check glucose at  least 3 times a day, diagnosis code E11.9 6 each 1   Cranberry 125 MG TABS Take 4 tablets by mouth daily.     diphenhydrAMINE (BENADRYL) 25 MG tablet Take 50 mg by mouth at bedtime as needed for sleep.     Dulaglutide (TRULICITY) 3.53 IR/4.4RX SOPN Inject 0.75 mg into the skin once a week. 6 mL 1   famotidine (PEPCID) 20 MG tablet TAKE 1 TABLET BY MOUTH AT  BEDTIME 100 tablet 2   glipiZIDE (GLUCOTROL) 10 MG tablet TAKE 2 TABLETS BY MOUTH  TWICE DAILY 360 tablet 3   insulin NPH Human (HUMULIN N) 100 UNIT/ML injection INJECT SUBCUTANEOUSLY 20  UNITS IN THE MORNING AND 15 UNITS AT BEDTIME 40 mL 3   Insulin Syringe-Needle U-100 (BD INSULIN SYRINGE U/F) 31G X 5/16" 0.3 ML MISC USE TWICE DAILY WITH  INSULIN 180 each 1   JARDIANCE 25 MG TABS tablet TAKE 1 TABLET BY MOUTH  DAILY 100 tablet 2   meloxicam (MOBIC) 7.5 MG tablet TAKE 1 TABLET BY MOUTH EVERY DAY 30 tablet 0   metFORMIN (GLUCOPHAGE-XR) 500 MG 24 hr tablet TAKE 4 TABLETS BY MOUTH  ONCE DAILY WITH BREAKFAST 360 tablet 3   Multiple Vitamins-Minerals (MULTIVITAMIN WITH MINERALS) tablet Take 2 tablets by mouth daily.     Omega-3 Fatty  Acids (FISH OIL) 1000 MG CPDR Take 4 capsules by mouth every other day.     rosuvastatin (CRESTOR) 20 MG tablet TAKE 1 TABLET BY MOUTH  DAILY 90 tablet 3   triamcinolone cream (KENALOG) 0.1 % APPLY CREAM EXTERNALLY 2  TIMES DAILY AS NEEDED 45 g 0   Vitamin D, Ergocalciferol, (DRISDOL) 1.25 MG (50000 UNIT) CAPS capsule Take 1 capsule (50,000 Units total) by mouth every 7 (seven) days. 8 capsule 0   No current facility-administered medications on file prior to visit.     ROS see history of present illness  Objective  Physical Exam Vitals:   06/21/22 1537  BP: 130/70  Pulse: 89  Temp: 98.3 F (36.8 C)  SpO2: 96%    BP Readings from Last 3 Encounters:  06/21/22 130/70  02/03/22 140/70  10/28/21 120/70   Wt Readings from Last 3 Encounters:  06/21/22 247 lb 9.6 oz (112.3 kg)  05/10/22 247 lb (112 kg)   02/03/22 247 lb 6.4 oz (112.2 kg)    Physical Exam Constitutional:      General: He is not in acute distress.    Appearance: He is not diaphoretic.  Cardiovascular:     Rate and Rhythm: Normal rate and regular rhythm.     Heart sounds: Normal heart sounds.  Pulmonary:     Effort: Pulmonary effort is normal.     Breath sounds: Normal breath sounds.  Musculoskeletal:     Comments: Right ankle with soft tissue swelling, there is tenderness medially of the right ankle, right knee with some enlargement that seems to be bony enlargement with some mild soft tissue swelling, no warmth or erythema of the knee or ankle on the right side, there is some soft tissue tenderness of the right knee  Skin:    General: Skin is warm and dry.  Neurological:     Mental Status: He is alert.      Assessment/Plan: Please see individual problem list.  Problem List Items Addressed This Visit     Essential hypertension - Primary (Chronic)    Adequately controlled.  He will continue chlorthalidone 50 mg daily.      Type 2 diabetes mellitus with diabetic neuropathy, unspecified ([facility]) (Chronic)    Stable.  He will continue glipizide 20 mg twice daily, NPH insulin 25 units in the morning and 20 units at bedtime,, Jardiance 25 mg daily and metformin XR 1000 mg daily.      Osteoarthritis    I suspect the patient's knee and ankle pain are related to arthritis.  These are chronic ongoing issues.  Discussed use of Voltaren gel over-the-counter to see if that would be beneficial.  I advised that he reduce his aspirin intake to no more than 1/day given the risk of GI irritation and GI bleed.  I will refer him to orthopedics.      Relevant Orders   Ambulatory referral to Orthopedic Surgery   Other Visit Diagnoses     Prostate cancer screening       Relevant Orders   PSA, Medicare ( [place] only)   Need for pneumococcal vaccination       Relevant Orders   Pneumococcal polysaccharide vaccine  23-valent greater than or equal to 2yo subcutaneous/IM (Completed)        Health Maintenance: Patient notes a family history of Guillain-Barr syndrome with the shingles vaccine so he will defer this.  Pneumovax 23 given today.  He will get his tetanus vaccine at the pharmacy.  Call [facility] to schedule his yearly ophthalmology visit.  Return in about 3 months (around 09/21/2022).   Patrick Rumps, MD [place]

## 2022-06-21 NOTE — Assessment & Plan Note (Signed)
Stable.  He will continue glipizide 20 mg twice daily, NPH insulin 25 units in the morning and 20 units at bedtime,, Jardiance 25 mg daily and metformin XR 1000 mg daily.

## 2022-06-21 NOTE — Assessment & Plan Note (Signed)
I suspect the patient's knee and ankle pain are related to arthritis.  These are chronic ongoing issues.  Discussed use of Voltaren gel over-the-counter to see if that would be beneficial.  I advised that he reduce his aspirin intake to no more than 1/day given the risk of GI irritation and GI bleed.  I will refer him to orthopedics.

## 2022-06-21 NOTE — Assessment & Plan Note (Signed)
Adequately controlled.  He will continue chlorthalidone 50 mg daily.

## 2022-06-22 ENCOUNTER — Telehealth: Payer: Self-pay | Admitting: Family Medicine

## 2022-06-22 LAB — PSA, MEDICARE: PSA: 3.34 ng/ml (ref 0.10–4.00)

## 2022-06-22 NOTE — Telephone Encounter (Signed)
Patient called to state he just spoke with someone in our office, but he has additional information to give [place].  Patient states he cannot get the meloxicam (Mobic) 7.5 MG tablets over the counter.  Patient states he would like for [place] to send a prescription to [facility] [place].  Patient states he would like for [place] to call him when the prescription has been called in.

## 2022-06-22 NOTE — Telephone Encounter (Signed)
Patient saw Dr Birdie Sons yesterday and he wanted patient to get meloxican over the counter, It cost $28, a prescription is free for him. Please [facility] in [facility].

## 2022-06-23 ENCOUNTER — Other Ambulatory Visit: Payer: Self-pay | Admitting: Family

## 2022-06-23 DIAGNOSIS — M79604 Pain in right leg: Secondary | ICD-10-CM

## 2022-06-23 MED ORDER — MELOXICAM 7.5 MG PO TABS: 7.5000 mg | ORAL_TABLET | Freq: Every day | ORAL | 0 refills | Status: DC

## 2022-06-24 ENCOUNTER — Emergency Department: Payer: Medicare Other

## 2022-06-24 ENCOUNTER — Other Ambulatory Visit: Payer: Self-pay

## 2022-06-24 ENCOUNTER — Inpatient Hospital Stay
Admission: EM | Admit: 2022-06-24 | Discharge: 2022-06-29 | DRG: 149 | Disposition: A | Discharge status: Home Health | Payer: Medicare Other | Attending: Internal Medicine | Admitting: Internal Medicine

## 2022-06-24 DIAGNOSIS — Z882 Allergy status to sulfonamides status: Secondary | ICD-10-CM

## 2022-06-24 DIAGNOSIS — E1165 Type 2 diabetes mellitus with hyperglycemia: Secondary | ICD-10-CM | POA: Diagnosis present

## 2022-06-24 DIAGNOSIS — M79604 Pain in right leg: Secondary | ICD-10-CM

## 2022-06-24 DIAGNOSIS — R29898 Other symptoms and signs involving the musculoskeletal system: Secondary | ICD-10-CM | POA: Diagnosis not present

## 2022-06-24 DIAGNOSIS — I6621 Occlusion and stenosis of right posterior cerebral artery: Secondary | ICD-10-CM | POA: Diagnosis not present

## 2022-06-24 DIAGNOSIS — R6889 Other general symptoms and signs: Secondary | ICD-10-CM | POA: Diagnosis not present

## 2022-06-24 DIAGNOSIS — H8122 Vestibular neuronitis, left ear: Secondary | ICD-10-CM | POA: Diagnosis not present

## 2022-06-24 DIAGNOSIS — I4891 Unspecified atrial fibrillation: Secondary | ICD-10-CM | POA: Diagnosis not present

## 2022-06-24 DIAGNOSIS — E119 Type 2 diabetes mellitus without complications: Secondary | ICD-10-CM

## 2022-06-24 DIAGNOSIS — M1711 Unilateral primary osteoarthritis, right knee: Secondary | ICD-10-CM | POA: Diagnosis not present

## 2022-06-24 DIAGNOSIS — H5509 Other forms of nystagmus: Secondary | ICD-10-CM | POA: Diagnosis present

## 2022-06-24 DIAGNOSIS — Z791 Long term (current) use of non-steroidal anti-inflammatories (NSAID): Secondary | ICD-10-CM

## 2022-06-24 DIAGNOSIS — Z20822 Contact with and (suspected) exposure to covid-19: Secondary | ICD-10-CM | POA: Diagnosis present

## 2022-06-24 DIAGNOSIS — E1151 Type 2 diabetes mellitus with diabetic peripheral angiopathy without gangrene: Secondary | ICD-10-CM | POA: Diagnosis present

## 2022-06-24 DIAGNOSIS — M79605 Pain in left leg: Secondary | ICD-10-CM | POA: Diagnosis not present

## 2022-06-24 DIAGNOSIS — M1909 Primary osteoarthritis, other specified site: Secondary | ICD-10-CM | POA: Diagnosis present

## 2022-06-24 DIAGNOSIS — Z743 Need for continuous supervision: Secondary | ICD-10-CM | POA: Diagnosis not present

## 2022-06-24 DIAGNOSIS — I499 Cardiac arrhythmia, unspecified: Secondary | ICD-10-CM | POA: Diagnosis not present

## 2022-06-24 DIAGNOSIS — R404 Transient alteration of awareness: Secondary | ICD-10-CM | POA: Diagnosis not present

## 2022-06-24 DIAGNOSIS — I4510 Unspecified right bundle-branch block: Secondary | ICD-10-CM | POA: Diagnosis not present

## 2022-06-24 DIAGNOSIS — E876 Hypokalemia: Secondary | ICD-10-CM | POA: Diagnosis not present

## 2022-06-24 DIAGNOSIS — Z803 Family history of malignant neoplasm of breast: Secondary | ICD-10-CM | POA: Diagnosis not present

## 2022-06-24 DIAGNOSIS — I639 Cerebral infarction, unspecified: Secondary | ICD-10-CM | POA: Diagnosis not present

## 2022-06-24 DIAGNOSIS — N390 Urinary tract infection, site not specified: Secondary | ICD-10-CM

## 2022-06-24 DIAGNOSIS — I440 Atrioventricular block, first degree: Secondary | ICD-10-CM | POA: Diagnosis not present

## 2022-06-24 DIAGNOSIS — H8120 Vestibular neuronitis, unspecified ear: Secondary | ICD-10-CM | POA: Diagnosis not present

## 2022-06-24 DIAGNOSIS — M1712 Unilateral primary osteoarthritis, left knee: Secondary | ICD-10-CM | POA: Diagnosis not present

## 2022-06-24 DIAGNOSIS — I10 Essential (primary) hypertension: Secondary | ICD-10-CM | POA: Diagnosis present

## 2022-06-24 DIAGNOSIS — H8110 Benign paroxysmal vertigo, unspecified ear: Secondary | ICD-10-CM | POA: Diagnosis not present

## 2022-06-24 DIAGNOSIS — R319 Hematuria, unspecified: Secondary | ICD-10-CM | POA: Diagnosis present

## 2022-06-24 DIAGNOSIS — Z79899 Other long term (current) drug therapy: Secondary | ICD-10-CM

## 2022-06-24 DIAGNOSIS — Z87891 Personal history of nicotine dependence: Secondary | ICD-10-CM

## 2022-06-24 DIAGNOSIS — E785 Hyperlipidemia, unspecified: Secondary | ICD-10-CM | POA: Diagnosis not present

## 2022-06-24 DIAGNOSIS — G319 Degenerative disease of nervous system, unspecified: Secondary | ICD-10-CM | POA: Diagnosis not present

## 2022-06-24 DIAGNOSIS — Z7982 Long term (current) use of aspirin: Secondary | ICD-10-CM

## 2022-06-24 DIAGNOSIS — R42 Dizziness and giddiness: Secondary | ICD-10-CM

## 2022-06-24 DIAGNOSIS — Z808 Family history of malignant neoplasm of other organs or systems: Secondary | ICD-10-CM | POA: Diagnosis not present

## 2022-06-24 DIAGNOSIS — I6523 Occlusion and stenosis of bilateral carotid arteries: Secondary | ICD-10-CM | POA: Diagnosis not present

## 2022-06-24 DIAGNOSIS — Z888 Allergy status to other drugs, medicaments and biological substances status: Secondary | ICD-10-CM

## 2022-06-24 DIAGNOSIS — Z87898 Personal history of other specified conditions: Secondary | ICD-10-CM

## 2022-06-24 DIAGNOSIS — R270 Ataxia, unspecified: Secondary | ICD-10-CM | POA: Diagnosis not present

## 2022-06-24 DIAGNOSIS — Z794 Long term (current) use of insulin: Secondary | ICD-10-CM | POA: Diagnosis not present

## 2022-06-24 DIAGNOSIS — I6503 Occlusion and stenosis of bilateral vertebral arteries: Secondary | ICD-10-CM | POA: Diagnosis not present

## 2022-06-24 DIAGNOSIS — I672 Cerebral atherosclerosis: Secondary | ICD-10-CM | POA: Diagnosis not present

## 2022-06-24 DIAGNOSIS — Z7984 Long term (current) use of oral hypoglycemic drugs: Secondary | ICD-10-CM

## 2022-06-24 DIAGNOSIS — R600 Localized edema: Secondary | ICD-10-CM | POA: Diagnosis not present

## 2022-06-24 DIAGNOSIS — Z7985 Long-term (current) use of injectable non-insulin antidiabetic drugs: Secondary | ICD-10-CM

## 2022-06-24 LAB — URINALYSIS, ROUTINE W REFLEX MICROSCOPIC
Bilirubin Urine: NEGATIVE
Glucose, UA: >=500 mg/dL — AB
Hgb urine dipstick: NEGATIVE
Ketones, ur: 5 mg/dL — AB
Nitrite: NEGATIVE
Protein, ur: NEGATIVE mg/dL
Specific Gravity, Urine: 1.023 (ref 1.005–1.030)
pH: 5 (ref 5.0–8.0)

## 2022-06-24 LAB — BASIC METABOLIC PANEL
Anion gap: 10 (ref 5–15)
BUN: 24 mg/dL — ABNORMAL HIGH (ref 8–23)
CO2: 25 mmol/L (ref 22–32)
Calcium: 8.9 mg/dL (ref 8.9–10.3)
Chloride: 104 mmol/L (ref 98–111)
Creatinine, Ser: 0.81 mg/dL (ref 0.61–1.24)
GFR, Estimated: >60 mL/min (ref >60)
Glucose, Bld: 205 mg/dL — ABNORMAL HIGH (ref 70–99)
Potassium: 3.4 mmol/L — ABNORMAL LOW (ref 3.5–5.1)
Sodium: 139 mmol/L (ref 135–145)

## 2022-06-24 LAB — TSH: TSH: 3.253 u[IU]/mL (ref 0.350–4.500)

## 2022-06-24 LAB — HEPATIC FUNCTION PANEL
ALT: 15 U/L (ref 0–44)
AST: 19 U/L (ref 15–41)
Albumin: 3.6 g/dL (ref 3.5–5.0)
Alkaline Phosphatase: 71 U/L (ref 38–126)
Bilirubin, Direct: <0.1 mg/dL (ref 0.0–0.2)
Total Bilirubin: 0.9 mg/dL (ref 0.3–1.2)
Total Protein: 7.2 g/dL (ref 6.5–8.1)

## 2022-06-24 LAB — CBC WITH DIFFERENTIAL/PLATELET
Abs Immature Granulocytes: 0.05 10*3/uL (ref 0.00–0.07)
Basophils Absolute: 0 10*3/uL (ref 0.0–0.1)
Basophils Relative: 0 %
Eosinophils Absolute: 0.4 10*3/uL (ref 0.0–0.5)
Eosinophils Relative: 4 %
HCT: 44.7 % (ref 39.0–52.0)
Hemoglobin: 14.9 g/dL (ref 13.0–17.0)
Immature Granulocytes: 1 %
Lymphocytes Relative: 16 %
Lymphs Abs: 1.6 10*3/uL (ref 0.7–4.0)
MCH: 29.6 pg (ref 26.0–34.0)
MCHC: 33.3 g/dL (ref 30.0–36.0)
MCV: 88.7 fL (ref 80.0–100.0)
Monocytes Absolute: 1 10*3/uL (ref 0.1–1.0)
Monocytes Relative: 10 %
Neutro Abs: 7.1 10*3/uL (ref 1.7–7.7)
Neutrophils Relative %: 69 %
Platelets: 215 10*3/uL (ref 150–400)
RBC: 5.04 MIL/uL (ref 4.22–5.81)
RDW: 14 % (ref 11.5–15.5)
WBC: 10.1 10*3/uL (ref 4.0–10.5)
nRBC: 0 % (ref 0.0–0.2)

## 2022-06-24 LAB — TROPONIN I (HIGH SENSITIVITY)
Troponin I (High Sensitivity): 9 ng/L (ref <18)
Troponin I (High Sensitivity): 10 ng/L (ref <18)

## 2022-06-24 LAB — CBG MONITORING, ED
Glucose-Capillary: 222 mg/dL — ABNORMAL HIGH (ref 70–99)
Glucose-Capillary: 220 mg/dL — ABNORMAL HIGH (ref 70–99)
Glucose-Capillary: 231 mg/dL — ABNORMAL HIGH (ref 70–99)

## 2022-06-24 LAB — MAGNESIUM: Magnesium: 2.3 mg/dL (ref 1.7–2.4)

## 2022-06-24 LAB — T4, FREE: Free T4: 0.89 ng/dL (ref 0.61–1.12)

## 2022-06-24 MED ORDER — ONDANSETRON HCL 4 MG/2ML IJ SOLN: 4.0000 mg | Freq: Once | INTRAMUSCULAR | Status: DC

## 2022-06-24 MED ORDER — INSULIN DETEMIR 100 UNIT/ML ~~LOC~~ SOLN
25.0000 [IU] | Freq: Once | SUBCUTANEOUS | Status: DC
Start: 2022-06-24 — End: 2022-06-24
  Filled 2022-06-24: qty 0.25

## 2022-06-24 MED ORDER — FENTANYL CITRATE PF 50 MCG/ML IJ SOSY
50.0000 ug | PREFILLED_SYRINGE | INTRAMUSCULAR | Status: DC | PRN
  Filled 2022-06-24: qty 1

## 2022-06-24 MED ORDER — POTASSIUM CHLORIDE CRYS ER 20 MEQ PO TBCR
40.0000 meq | EXTENDED_RELEASE_TABLET | Freq: Once | ORAL | Status: AC
  Administered 2022-06-24: 40 meq via ORAL
  Filled 2022-06-24: qty 2

## 2022-06-24 MED ORDER — IOHEXOL 350 MG/ML SOLN
75.0000 mL | Freq: Once | INTRAVENOUS | Status: AC | PRN
  Administered 2022-06-24: 75 mL via INTRAVENOUS

## 2022-06-24 MED ORDER — INSULIN NPH (HUMAN) (ISOPHANE) 100 UNIT/ML ~~LOC~~ SUSP
25.0000 [IU] | Freq: Once | SUBCUTANEOUS | Status: AC
Start: 2022-06-24 — End: 2022-06-24
  Administered 2022-06-24: 25 [IU] via SUBCUTANEOUS
  Filled 2022-06-24: qty 10

## 2022-06-24 MED ORDER — INSULIN NPH (HUMAN) (ISOPHANE) 100 UNIT/ML ~~LOC~~ SUSP: 25.0000 [IU] | Freq: Once | SUBCUTANEOUS | Status: DC

## 2022-06-24 MED ORDER — FENTANYL CITRATE PF 50 MCG/ML IJ SOSY: 75.0000 ug | PREFILLED_SYRINGE | Freq: Once | INTRAMUSCULAR | Status: DC

## 2022-06-24 NOTE — ED Triage Notes (Signed)
Pt alert and oriented x 4, able to move all extremities, pt with c/o dizziness since 10 pm last evening. Pt started taking meloxicam last evening at 5 pm, took at nap at 9pm and woke up at 10 pm with dizziness. LKW 9 pm 06/23/2022. Pt with full assist by EMS to get out of house. Pt with hx of afib in his 30s but not since, not currently on any blood thinner per pt. EDP in room.  No c/o N/V

## 2022-06-24 NOTE — ED Notes (Signed)
Patient transported to MRI 

## 2022-06-24 NOTE — Discharge Instructions (Addendum)
You have new atrial fibrillation to be followed up with cardiology for this.  Urine culture is processed to look for any UTI.

## 2022-06-24 NOTE — ED Notes (Signed)
Patient transported to CT 

## 2022-06-24 NOTE — ED Provider Notes (Addendum)
[facility] Provider Note    Event Date/Time   First MD Initiated Contact with Patient 06/24/22 1111     (approximate)   History   Dizziness   HPI  Patrick Patel is a 81 y.o. male with diabetes, hypertension who comes in with concerns for dizziness.  Patient reports having some right leg pain was being seen by his family med doctor who started on meloxicam.  He reports taking the medication at 5 PM and at 9 PM he went to bed and felt his normal self and woke up at 10 PM on 06/23/2022 with some mild dizziness.  Therefore last known normal was 9 PM on 06/23/2022.  States that he had some worsening dizziness and feels like his eyes are darting back and forth.  This is making it difficult for him to ambulate.  Denies any numbness or tingling or weakness in 1 leg although he does have some weakness secondary to the pain in the right leg which was what he was being seen for on the 12th.  Patient denies any falls, hitting his head.   Physical Exam   Triage Vital Signs: ED Triage Vitals  Enc Vitals Group     BP 06/24/22 1106 (!) 153/67     Pulse Rate 06/24/22 1103 71     Resp 06/24/22 1103 15     Temp 06/24/22 1108 98.5 F (36.9 C)     Temp Source 06/24/22 1108 Oral     SpO2 06/24/22 1103 99 %     Weight 06/24/22 1104 200 lb (90.7 kg)     Height 06/24/22 1104 5\' 8"  (1.727 m)     Head Circumference --      Peak Flow --      Pain Score 06/24/22 1104 0     Pain Loc --      Pain Edu? --      Excl. in GC? --     Most recent vital signs: Vitals:   06/24/22 1106 06/24/22 1108  BP: (!) 153/67   Pulse:    Resp:    Temp:  98.5 F (36.9 C)  SpO2:       General: Awake, no distress.  CV:  Good peripheral perfusion.  Resp:  Normal effort.  Abd:  No distention.  Other:  Patient reports a little bit of haziness on the lateral side of the right eye.  Finger-to-nose are intact bilaterally.  Patient has a little bit of horizontal nystagmus noted in bilateral  eyes.  NIH stroke scale is 1   ED Results / Procedures / Treatments   Labs (all labs ordered are listed, but only abnormal results are displayed) Labs Reviewed  CBC WITH DIFFERENTIAL/PLATELET  MAGNESIUM  TSH  T4, FREE  URINALYSIS, ROUTINE W REFLEX MICROSCOPIC  BASIC METABOLIC PANEL  HEPATIC FUNCTION PANEL  TROPONIN I (HIGH SENSITIVITY)     EKG  My interpretation of EKG:  EKG appears to be more like sinus with PACs without any ST elevation or T wave versions, normal intervals  RADIOLOGY I have reviewed the CTA personally and interpreted I do not see any evidence of intracranial hemorrhage.  PROCEDURES:  Critical Care performed: No  .1-3 Lead EKG Interpretation  Performed by: 06/26/22, MD Authorized by: Concha Se, MD     Interpretation: abnormal     ECG rate:  70   ECG rate assessment: normal     Rhythm: sinus rhythm     Ectopy: PAC  Conduction: abnormal     Abnormal conduction: 1st degree AV block      MEDICATIONS ORDERED IN ED: Medications  fentaNYL (SUBLIMAZE) injection 75 mcg (75 mcg Intravenous Patient Refused/Not Given 06/24/22 1514)  ondansetron (ZOFRAN) injection 4 mg (4 mg Intravenous Patient Refused/Not Given 06/24/22 1514)  insulin detemir (LEVEMIR) injection 25 Units (has no administration in time range)  iohexol (OMNIPAQUE) 350 MG/ML injection 75 mL (75 mLs Intravenous Contrast Given 06/24/22 1245)  potassium chloride SA (KLOR-CON M) CR tablet 40 mEq (40 mEq Oral Given 06/24/22 1355)     IMPRESSION / MDM / ASSESSMENT AND PLAN / ED COURSE  I reviewed the triage vital signs and the nursing notes.   Patient's presentation is most consistent with acute presentation with potential threat to life or bodily function.   Based upon last known him normal patient's out of the window for TNK.  Will get CTA to evaluate for any posterior occlusion that could be amenable to thrombectomy however his NIH stroke scale currently is less than 6.  Glucose  was 222.  CBC normal troponin negative mag negative TSH normal BMP shows slightly low potassium we will give some oral repletion   Reevaluated patient offered patient admission for his dizziness and MRI versus MRI and potentially going home if negative.  Patient would like to try to get the MRI and then to see how he is feeling.  I have offered him medications to help with the dizziness but he declines that he feels that this is from the meloxicam.  Explained to patient that usually meloxicam would not cause this kind of dizziness this prolonged.   IMPRESSION: 1. Negative for large vessel occlusion.   2. 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 But no stenosis greater than 50% is identified.   3. Less pronounced posterior circulation atherosclerosis. The left vertebral artery arises directly from the arch.   4. No acute intracranial abnormality by CT. Mild for age nonspecific cerebral white matter changes.   5. Aortic Atherosclerosis (ICD10-I70.0).    Discussed incidental findings with patient  UA with some possible signs of UTI but he denies any urinary symptoms.  We will send for culture  Patient handed off to oncoming team pending these results including MRI.  At first we thought this could be A-fib but looks more like sinus with prolonged type I interval and PACs therefore we will hold off on anticoagulation. Will get repeat EKG to ensure.   3:18 PM patient getting very upset stating that he could not do the MRI due to the pain in his neck that is baseline for him.  He is declining pain medications he has a mechanical issue with his neck when his neck lies not just the pain.  Patient stated that he needs something for food.  He wants his insulin.  He states this is why he is not feeling well.  Patient provided food and his insulin.  He continues to refuse the MRI.  Patient will be handed off pending how patient does with ambulating after food and  insulin.  We will also have discussion with neurology.  The patient is on the cardiac monitor to evaluate for evidence of arrhythmia and/or significant heart rate changes.    Repeat EKG my interpretation is sinus rate of 80 without any ST elevation or T wave inversions with continued prolonged AV block type I  FINAL CLINICAL IMPRESSION(S) / ED DIAGNOSES   Final diagnoses:  Dizziness  Atrial fibrillation,  unspecified type [facility])     Rx / DC Orders   ED Discharge Orders          Ordered    Ambulatory referral to Cardiology        06/24/22 1412             Note:  This document was prepared using Dragon voice recognition software and may include unintentional dictation errors.   Concha Se, MD 06/24/22 1410    Concha Se, MD 06/24/22 1413    Concha Se, MD 06/24/22 1522    Concha Se, MD 06/24/22 [id number]

## 2022-06-24 NOTE — Consult Note (Signed)
NEURO HOSPITALIST CONSULT NOTE   Requestig physician: Dr. Jari Pigg  Reason for Consult: Acute onset of dizziness  History obtained from:  Patient and Chart     HPI:                                                                                                                                          Patrick Patel is an 81 y.o. male with a PMHx of DM, HLD and HTN who presented to the ED this AM with a c/c of dizziness since 10 PM yesterday evening. He had just gottten a prescription for meloxicam filled to treat his knee pain; he started taking the meloxicam last evening at 5 pm, took at nap at 9 pm and woke up at 10 pm with dizziness. He required full assist by EMS to get out of his house. He has a history of atrial fibrillation which occurred in his 68s but not since. He is not currently on a blood thinner. Home meds include ASA and rosuvastatin.   Additional history from EDP note has been reviewed: "Patrick Patel is a 81 y.o. male with diabetes, hypertension who comes in with concerns for dizziness.  Patient reports having some right leg pain was being seen by his family med doctor who started on meloxicam.  He reports taking the medication at 5 PM and at 9 PM he went to bed and felt his normal self and woke up at 10 PM on 06/23/2022 with some mild dizziness.  Therefore last known normal was 9 PM on 06/23/2022.  States that he had some worsening dizziness and feels like his eyes are darting back and forth.  This is making it difficult for him to ambulate.  Denies any numbness or tingling or weakness in 1 leg although he does have some weakness secondary to the pain in the right leg which was what he was being seen for on the 12th.  Patient denies any falls, hitting his head."  CTA of head and neck: 1. Negative for large vessel occlusion.  2. 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 But no stenosis greater than 50% is identified.  3.  Less pronounced posterior circulation atherosclerosis. The left vertebral artery arises directly from the arch.  4. No acute intracranial abnormality by CT. Mild for age nonspecific cerebral white matter changes.  5. Aortic Atherosclerosis  Past Medical History:  Diagnosis Date   Diabetes ([place])    Hyperlipidemia    Hypertension     History reviewed. No pertinent surgical history.  Family History  Problem Relation Age of Onset   Breast cancer Mother    Thyroid cancer Mother               Social History:  reports  that he has quit smoking. His smokeless tobacco use includes snuff. He reports that he does not drink alcohol and does not use drugs.  Allergies  Allergen Reactions   Bactrim [Sulfamethoxazole-Trimethoprim] Swelling and Rash   Pioglitazone Swelling   Rosiglitazone Other (See Comments)    Ankle swelling    HOME MEDICATIONS:                                                                                                                      No current facility-administered medications on file prior to encounter.   Current Outpatient Medications on File Prior to Encounter  Medication Sig Dispense Refill   ACCU-CHEK AVIVA PLUS test strip USE TO CHECK BLOOD GLUCOSE  3 TIMES DAILY 300 strip 2   Accu-Chek Softclix Lancets lancets Use as instructedUSE TO CHECK BLOOD GLUCOSE 3 TIMES DAILY 300 each 2   acetaminophen (TYLENOL) 500 MG tablet Take 1,000 mg by mouth 2 (two) times daily at 10 AM and 5 PM.      alfuzosin (UROXATRAL) 10 MG 24 hr tablet TAKE 1 TABLET BY MOUTH  DAILY WITH BREAKFAST 90 tablet 3   aspirin EC 325 MG tablet Take 650 mg by mouth daily.      blood glucose meter kit and supplies KIT Dispense Accu-Chek Aviva plus. Check once daily as directed. For ICD 10 code E11.9. 1 each 0   Calcium-Magnesium-Zinc 167-83-8 MG TABS Take 4 tablets by mouth daily.     chlorthalidone (HYGROTON) 50 MG tablet TAKE 1 TABLET BY MOUTH  DAILY 90 tablet 3   Continuous Blood Gluc  Receiver (FREESTYLE LIBRE 2 READER) DEVI Use at least every 8 hours to read your blood glucose 1 each 0   Continuous Blood Gluc Sensor (FREESTYLE LIBRE 14 DAY SENSOR) MISC Apply every 14 days, used to check glucose at least 3 times a day, diagnosis code E11.9 6 each 1   Cranberry 125 MG TABS Take 4 tablets by mouth daily.     diphenhydrAMINE (BENADRYL) 25 MG tablet Take 50 mg by mouth at bedtime as needed for sleep.     Dulaglutide (TRULICITY) 5.79 UX/8.3FX SOPN Inject 0.75 mg into the skin once a week. 6 mL 1   famotidine (PEPCID) 20 MG tablet TAKE 1 TABLET BY MOUTH AT  BEDTIME 100 tablet 2   glipiZIDE (GLUCOTROL) 10 MG tablet TAKE 2 TABLETS BY MOUTH  TWICE DAILY 360 tablet 3   insulin NPH Human (HUMULIN N) 100 UNIT/ML injection INJECT SUBCUTANEOUSLY 20  UNITS IN THE MORNING AND 15 UNITS AT BEDTIME 40 mL 3   Insulin Syringe-Needle U-100 (BD INSULIN SYRINGE U/F) 31G X 5/16" 0.3 ML MISC USE TWICE DAILY WITH  INSULIN 180 each 1   JARDIANCE 25 MG TABS tablet TAKE 1 TABLET BY MOUTH  DAILY 100 tablet 2   meloxicam (MOBIC) 7.5 MG tablet Take 1 tablet (7.5 mg total) by mouth daily. 90 tablet 0   metFORMIN (GLUCOPHAGE-XR) 500 MG 24 hr tablet TAKE 4 TABLETS BY  MOUTH  ONCE DAILY WITH BREAKFAST 360 tablet 3   Multiple Vitamins-Minerals (MULTIVITAMIN WITH MINERALS) tablet Take 2 tablets by mouth daily.     Omega-3 Fatty Acids (FISH OIL) 1000 MG CPDR Take 4 capsules by mouth every other day.     rosuvastatin (CRESTOR) 20 MG tablet TAKE 1 TABLET BY MOUTH  DAILY 90 tablet 3   triamcinolone cream (KENALOG) 0.1 % APPLY CREAM EXTERNALLY 2  TIMES DAILY AS NEEDED 45 g 0   Vitamin D, Ergocalciferol, (DRISDOL) 1.25 MG (50000 UNIT) CAPS capsule Take 1 capsule (50,000 Units total) by mouth every 7 (seven) days. 8 capsule 0     ROS:                                                                                                                                       No nausea or vomiting.    Blood pressure (!) 153/67,  pulse 71, temperature 98.5 F (36.9 C), temperature source Oral, resp. rate 15, height 5' 8"  (1.727 m), weight 90.7 kg, SpO2 99 %.   General Examination:                                                                                                       Physical Exam HEENT-  [name]/AT   Lungs- Respirations unlabored Extremities- Warm and well-perfused  Neurological Examination Mental Status: Awake and alert. Fully oriented. Thought content appropriate. Speech fluent without evidence of aphasia.  Able to follow all commands without difficulty. Cranial Nerves: II: Temporal visual fields intact with no extinction to DSS. PERRL. III,IV, VI: No ptosis. EOM are full, however, there is horizontal nystagmus, fast beating to the left, induced by leftward gaze. Also with oblique nystagmus, fast beating up and to the left, induced by upgaze. No nystagmus with rightward and downward gaze. The nystagmus is non-fatiguing and associated with symptom of oscillopsia.  V: Temp sensation equal bilaterally VII: Smile symmetric VIII: Hearing intact to voice IX,X: No hypophonia or hoarseness XI: Symmetric XII: Midline tongue extension Motor: RUE: 5/5 except right deltoid due to old rotator cuff injury RLE: 5/5 LUE: 5/5 LLE: 5/5 Sensory: Temp and FT intact x 4. No extinction to DSS. Deep Tendon Reflexes: 2+ and symmetric bilateral biceps, brachioradialis and patellae Plantars: Right: downgoingLeft: downgoing Cerebellar: No ataxia with FNF or H-S bilaterally. Also no deficit with RAM testing of hands bilaterally.  Gait: Markedly unstable gait with wobbling and tendency to fall in all 4 directions, at times requiring max  assist, at best requiring contact guard.   Lab Results: Basic Metabolic Panel: No results for input(s): "NA", "K", "CL", "CO2", "GLUCOSE", "BUN", "CREATININE", "CALCIUM", "MG", "PHOS" in the last 168 hours.  CBC: Recent Labs  Lab 06/24/22 1117  WBC 10.1  NEUTROABS 7.1  HGB 14.9  HCT  44.7  MCV 88.7  PLT 215    Cardiac Enzymes: No results for input(s): "CKTOTAL", "CKMB", "CKMBINDEX", "TROPONINI" in the last 168 hours.  Lipid Panel: No results for input(s): "CHOL", "TRIG", "HDL", "CHOLHDL", "VLDL", "LDLCALC" in the last 168 hours.  Imaging: No results found.   Assessment: 81 y.o. male with a PMHx of DM, HLD and HTN who presented to the ED this AM with a c/c of dizziness since 10 PM yesterday evening. He had just gottten a prescription for meloxicam filled to treat his knee pain; he started taking the meloxicam last evening at 5 pm, took at nap at 9 pm and woke up at 10 pm with dizziness. He required full assist by EMS to get out of his house. He has a history of atrial fibrillation which occurred in his 66s but not since. He is not currently on a blood thinner. Home meds include ASA and rosuvastatin.  1. Exam reveals prominent gaze evoked nystagmus with leftward gaze and upgaze. Also with pronounced gait instability. No appendicular ataxia.  2. CT head (included with CTA): No acute intracranial abnormality by CT. Mild for age nonspecific cerebral white matter changes.  3. CTA of head and neck: Negative for large vessel occlusion. Positive for abundant bilateral carotid atherosclerosis through the ICA siphons, but no stenosis greater than 50% is identified. Less pronounced posterior circulation atherosclerosis. The left vertebral artery arises directly from the arch. Aortic Atherosclerosis. 4. Exam best localizes to the left peripheral vestibular system (DDx includes vestibular neuritis and BPPV). Also possible would be a left sided cerebellar or brainstem stroke.   Recommendations: 1. MRI brain. Patient consents to lay still for MRI if his neck pain, which is triggered by lying on his back without pillow support, can be managed prior to the scan.  2. Recommend 12.5 mcg IV fentanyl PRN neck pain, up to two doses.  3. Telemetry monitoring 4. Continue home ASA and  rosuvastatin. 5. PT consult 6. Further recommendations pending MRI results.   Addendum: - MRI brain reveals no acute intracranial abnormality. 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 No structural etiology for his dizziness is seen.  - As acute stroke has been ruled out, the most likely etiology for his dizziness is a peripheral vestibulopathy the most common cause of which, in the elderly population, is BPPV. Will need ENT evaluation.  - Drug side effect of meloxicam is also possible. I have not personally seen this before, but per the literature several NSAIDs are listed as having potential side effect of vertigo. The patient states that his vertigo started after he took his first dose of meloxicam. Meloxicam has not been continued this admission. Observe for possible improvement off of this medication.   - May also need vestibular PT - Neurohospitalist service will sign off. Please call if there are additional questions.    Electronically signed: Dr. Kerney Elbe 06/24/2022, 11:37 AM

## 2022-06-24 NOTE — ED Notes (Signed)
Bg 222

## 2022-06-25 DIAGNOSIS — H5509 Other forms of nystagmus: Secondary | ICD-10-CM | POA: Diagnosis present

## 2022-06-25 DIAGNOSIS — R270 Ataxia, unspecified: Secondary | ICD-10-CM

## 2022-06-25 DIAGNOSIS — N390 Urinary tract infection, site not specified: Secondary | ICD-10-CM

## 2022-06-25 DIAGNOSIS — Z87891 Personal history of nicotine dependence: Secondary | ICD-10-CM | POA: Diagnosis not present

## 2022-06-25 DIAGNOSIS — Z808 Family history of malignant neoplasm of other organs or systems: Secondary | ICD-10-CM | POA: Diagnosis not present

## 2022-06-25 DIAGNOSIS — I6523 Occlusion and stenosis of bilateral carotid arteries: Secondary | ICD-10-CM | POA: Diagnosis present

## 2022-06-25 DIAGNOSIS — Z882 Allergy status to sulfonamides status: Secondary | ICD-10-CM | POA: Diagnosis not present

## 2022-06-25 DIAGNOSIS — H8122 Vestibular neuronitis, left ear: Secondary | ICD-10-CM | POA: Diagnosis present

## 2022-06-25 DIAGNOSIS — I440 Atrioventricular block, first degree: Secondary | ICD-10-CM | POA: Diagnosis present

## 2022-06-25 DIAGNOSIS — Z803 Family history of malignant neoplasm of breast: Secondary | ICD-10-CM | POA: Diagnosis not present

## 2022-06-25 DIAGNOSIS — Z888 Allergy status to other drugs, medicaments and biological substances status: Secondary | ICD-10-CM | POA: Diagnosis not present

## 2022-06-25 DIAGNOSIS — I4891 Unspecified atrial fibrillation: Secondary | ICD-10-CM | POA: Diagnosis present

## 2022-06-25 DIAGNOSIS — Z7984 Long term (current) use of oral hypoglycemic drugs: Secondary | ICD-10-CM | POA: Diagnosis not present

## 2022-06-25 DIAGNOSIS — I4510 Unspecified right bundle-branch block: Secondary | ICD-10-CM | POA: Diagnosis present

## 2022-06-25 DIAGNOSIS — E119 Type 2 diabetes mellitus without complications: Secondary | ICD-10-CM | POA: Diagnosis not present

## 2022-06-25 DIAGNOSIS — E785 Hyperlipidemia, unspecified: Secondary | ICD-10-CM

## 2022-06-25 DIAGNOSIS — R42 Dizziness and giddiness: Secondary | ICD-10-CM

## 2022-06-25 DIAGNOSIS — I10 Essential (primary) hypertension: Secondary | ICD-10-CM | POA: Diagnosis present

## 2022-06-25 DIAGNOSIS — R319 Hematuria, unspecified: Secondary | ICD-10-CM | POA: Diagnosis present

## 2022-06-25 DIAGNOSIS — E1151 Type 2 diabetes mellitus with diabetic peripheral angiopathy without gangrene: Secondary | ICD-10-CM | POA: Diagnosis present

## 2022-06-25 DIAGNOSIS — Z7982 Long term (current) use of aspirin: Secondary | ICD-10-CM | POA: Diagnosis not present

## 2022-06-25 DIAGNOSIS — H8110 Benign paroxysmal vertigo, unspecified ear: Secondary | ICD-10-CM | POA: Diagnosis not present

## 2022-06-25 DIAGNOSIS — Z794 Long term (current) use of insulin: Secondary | ICD-10-CM | POA: Diagnosis not present

## 2022-06-25 DIAGNOSIS — E876 Hypokalemia: Secondary | ICD-10-CM | POA: Diagnosis present

## 2022-06-25 DIAGNOSIS — Z20822 Contact with and (suspected) exposure to covid-19: Secondary | ICD-10-CM | POA: Diagnosis present

## 2022-06-25 DIAGNOSIS — Z791 Long term (current) use of non-steroidal anti-inflammatories (NSAID): Secondary | ICD-10-CM | POA: Diagnosis not present

## 2022-06-25 DIAGNOSIS — Z87898 Personal history of other specified conditions: Secondary | ICD-10-CM | POA: Diagnosis not present

## 2022-06-25 DIAGNOSIS — E1165 Type 2 diabetes mellitus with hyperglycemia: Secondary | ICD-10-CM | POA: Diagnosis present

## 2022-06-25 DIAGNOSIS — M1909 Primary osteoarthritis, other specified site: Secondary | ICD-10-CM | POA: Diagnosis present

## 2022-06-25 DIAGNOSIS — H8120 Vestibular neuronitis, unspecified ear: Secondary | ICD-10-CM | POA: Diagnosis not present

## 2022-06-25 LAB — CBC
HCT: 45.1 % (ref 39.0–52.0)
HCT: 45 % (ref 39.0–52.0)
Hemoglobin: 15.3 g/dL (ref 13.0–17.0)
Hemoglobin: 15 g/dL (ref 13.0–17.0)
MCH: 29.9 pg (ref 26.0–34.0)
MCH: 29.9 pg (ref 26.0–34.0)
MCHC: 33.9 g/dL (ref 30.0–36.0)
MCHC: 33.3 g/dL (ref 30.0–36.0)
MCV: 88.3 fL (ref 80.0–100.0)
MCV: 89.6 fL (ref 80.0–100.0)
Platelets: 228 10*3/uL (ref 150–400)
Platelets: 233 10*3/uL (ref 150–400)
RBC: 5.11 MIL/uL (ref 4.22–5.81)
RBC: 5.02 MIL/uL (ref 4.22–5.81)
RDW: 14.1 % (ref 11.5–15.5)
RDW: 14.1 % (ref 11.5–15.5)
WBC: 10.1 10*3/uL (ref 4.0–10.5)
WBC: 9.8 10*3/uL (ref 4.0–10.5)
nRBC: 0 % (ref 0.0–0.2)
nRBC: 0 % (ref 0.0–0.2)

## 2022-06-25 LAB — CBG MONITORING, ED
Glucose-Capillary: 119 mg/dL — ABNORMAL HIGH (ref 70–99)
Glucose-Capillary: 129 mg/dL — ABNORMAL HIGH (ref 70–99)
Glucose-Capillary: 186 mg/dL — ABNORMAL HIGH (ref 70–99)
Glucose-Capillary: 223 mg/dL — ABNORMAL HIGH (ref 70–99)

## 2022-06-25 LAB — BASIC METABOLIC PANEL
Anion gap: 10 (ref 5–15)
BUN: 24 mg/dL — ABNORMAL HIGH (ref 8–23)
CO2: 24 mmol/L (ref 22–32)
Calcium: 9.2 mg/dL (ref 8.9–10.3)
Chloride: 105 mmol/L (ref 98–111)
Creatinine, Ser: 0.91 mg/dL (ref 0.61–1.24)
GFR, Estimated: >60 mL/min (ref >60)
Glucose, Bld: 148 mg/dL — ABNORMAL HIGH (ref 70–99)
Potassium: 3.4 mmol/L — ABNORMAL LOW (ref 3.5–5.1)
Sodium: 139 mmol/L (ref 135–145)

## 2022-06-25 LAB — CREATININE, SERUM
Creatinine, Ser: 0.89 mg/dL (ref 0.61–1.24)
GFR, Estimated: >60 mL/min (ref >60)

## 2022-06-25 LAB — SARS CORONAVIRUS 2 BY RT PCR: SARS Coronavirus 2 by RT PCR: NEGATIVE

## 2022-06-25 LAB — GLUCOSE, CAPILLARY: Glucose-Capillary: 263 mg/dL — ABNORMAL HIGH (ref 70–99)

## 2022-06-25 MED ORDER — ROSUVASTATIN CALCIUM 20 MG PO TABS
20.0000 mg | ORAL_TABLET | Freq: Every day | ORAL | Status: DC
  Administered 2022-06-25 – 2022-06-29 (×5): 20 mg via ORAL
  Filled 2022-06-25 (×5): qty 1

## 2022-06-25 MED ORDER — OMEGA-3-ACID ETHYL ESTERS 1 G PO CAPS
1.0000 g | ORAL_CAPSULE | Freq: Every day | ORAL | Status: DC
  Administered 2022-06-25 – 2022-06-29 (×5): 1 g via ORAL
  Filled 2022-06-25 (×5): qty 1

## 2022-06-25 MED ORDER — SODIUM CHLORIDE 0.9 % IV SOLN
1.0000 g | INTRAVENOUS | Status: DC
  Administered 2022-06-25 – 2022-06-27 (×3): 1 g via INTRAVENOUS
  Filled 2022-06-25 (×3): qty 10

## 2022-06-25 MED ORDER — ACETAMINOPHEN 650 MG RE SUPP: 650.0000 mg | Freq: Four times a day (QID) | RECTAL | Status: DC | PRN

## 2022-06-25 MED ORDER — MAGNESIUM HYDROXIDE 400 MG/5ML PO SUSP: 30.0000 mL | Freq: Every day | ORAL | Status: DC | PRN

## 2022-06-25 MED ORDER — ONDANSETRON HCL 4 MG/2ML IJ SOLN: 4.0000 mg | Freq: Four times a day (QID) | INTRAMUSCULAR | Status: DC | PRN

## 2022-06-25 MED ORDER — VITAMIN D (ERGOCALCIFEROL) 1.25 MG (50000 UNIT) PO CAPS
50000.0000 [IU] | ORAL_CAPSULE | ORAL | Status: DC
Start: 2022-06-25 — End: 2022-06-29
  Administered 2022-06-25: 50000 [IU] via ORAL
  Filled 2022-06-25: qty 1

## 2022-06-25 MED ORDER — ALFUZOSIN HCL ER 10 MG PO TB24
10.0000 mg | ORAL_TABLET | Freq: Every day | ORAL | Status: DC
  Administered 2022-06-25 – 2022-06-29 (×5): 10 mg via ORAL
  Filled 2022-06-25 (×5): qty 1

## 2022-06-25 MED ORDER — TRAZODONE HCL 50 MG PO TABS: 25.0000 mg | ORAL_TABLET | Freq: Every evening | ORAL | Status: DC | PRN

## 2022-06-25 MED ORDER — ZINC SULFATE 220 (50 ZN) MG PO CAPS
220.0000 mg | ORAL_CAPSULE | Freq: Every day | ORAL | Status: DC
Start: 2022-06-25 — End: 2022-06-29
  Administered 2022-06-25 – 2022-06-29 (×4): 220 mg via ORAL
  Filled 2022-06-25 (×5): qty 1

## 2022-06-25 MED ORDER — INSULIN ASPART 100 UNIT/ML IJ SOLN
0.0000 [IU] | Freq: Every day | INTRAMUSCULAR | Status: DC
  Administered 2022-06-25: 3 [IU] via SUBCUTANEOUS
  Administered 2022-06-26: 2 [IU] via SUBCUTANEOUS
  Administered 2022-06-27: 3 [IU] via SUBCUTANEOUS
  Filled 2022-06-25 (×3): qty 1

## 2022-06-25 MED ORDER — MECLIZINE HCL 25 MG PO TABS: 12.5000 mg | ORAL_TABLET | Freq: Three times a day (TID) | ORAL | Status: DC | PRN

## 2022-06-25 MED ORDER — DIPHENHYDRAMINE HCL 25 MG PO CAPS: 50.0000 mg | ORAL_CAPSULE | Freq: Every evening | ORAL | Status: DC | PRN

## 2022-06-25 MED ORDER — POTASSIUM CHLORIDE CRYS ER 20 MEQ PO TBCR
40.0000 meq | EXTENDED_RELEASE_TABLET | Freq: Once | ORAL | Status: AC
Start: 2022-06-25 — End: 2022-06-25
  Administered 2022-06-25: 40 meq via ORAL
  Filled 2022-06-25: qty 2

## 2022-06-25 MED ORDER — CALCIUM-MAGNESIUM-ZINC 167-83-8 MG PO TABS
4.0000 | ORAL_TABLET | Freq: Every day | ORAL | Status: DC
Start: 2022-06-25 — End: 2022-06-25

## 2022-06-25 MED ORDER — ACETAMINOPHEN 325 MG PO TABS
650.0000 mg | ORAL_TABLET | Freq: Four times a day (QID) | ORAL | Status: DC | PRN
  Administered 2022-06-25 – 2022-06-28 (×3): 650 mg via ORAL
  Filled 2022-06-25 (×4): qty 2

## 2022-06-25 MED ORDER — ASPIRIN 325 MG PO TBEC
650.0000 mg | DELAYED_RELEASE_TABLET | Freq: Every day | ORAL | Status: DC
  Administered 2022-06-26: 650 mg via ORAL
  Filled 2022-06-25 (×2): qty 2

## 2022-06-25 MED ORDER — DIPHENHYDRAMINE HCL 25 MG PO TABS: 50.0000 mg | ORAL_TABLET | Freq: Every evening | ORAL | Status: DC | PRN

## 2022-06-25 MED ORDER — TRAMADOL HCL 50 MG PO TABS
50.0000 mg | ORAL_TABLET | Freq: Once | ORAL | Status: AC
  Administered 2022-06-25: 50 mg via ORAL
  Filled 2022-06-25: qty 1

## 2022-06-25 MED ORDER — INSULIN ASPART 100 UNIT/ML IJ SOLN
0.0000 [IU] | Freq: Three times a day (TID) | INTRAMUSCULAR | Status: DC
  Administered 2022-06-25: 3 [IU] via SUBCUTANEOUS
  Administered 2022-06-25 – 2022-06-26 (×3): 2 [IU] via SUBCUTANEOUS
  Administered 2022-06-26 – 2022-06-27 (×4): 3 [IU] via SUBCUTANEOUS
  Administered 2022-06-28: 5 [IU] via SUBCUTANEOUS
  Filled 2022-06-25 (×9): qty 1

## 2022-06-25 MED ORDER — CHLORTHALIDONE 25 MG PO TABS
50.0000 mg | ORAL_TABLET | Freq: Every day | ORAL | Status: DC
  Administered 2022-06-25 – 2022-06-29 (×5): 50 mg via ORAL
  Filled 2022-06-25 (×5): qty 2

## 2022-06-25 MED ORDER — ONDANSETRON HCL 4 MG PO TABS: 4.0000 mg | ORAL_TABLET | Freq: Four times a day (QID) | ORAL | Status: DC | PRN

## 2022-06-25 MED ORDER — SODIUM CHLORIDE 0.9 % IV SOLN: INTRAVENOUS | Status: DC

## 2022-06-25 MED ORDER — ADULT MULTIVITAMIN W/MINERALS CH
2.0000 | ORAL_TABLET | Freq: Every day | ORAL | Status: DC
  Administered 2022-06-25 – 2022-06-29 (×5): 2 via ORAL
  Filled 2022-06-25 (×5): qty 2

## 2022-06-25 MED ORDER — MECLIZINE HCL 25 MG PO TABS
25.0000 mg | ORAL_TABLET | Freq: Once | ORAL | Status: AC
  Administered 2022-06-25: 25 mg via ORAL
  Filled 2022-06-25: qty 1

## 2022-06-25 MED ORDER — EMPAGLIFLOZIN 25 MG PO TABS
25.0000 mg | ORAL_TABLET | Freq: Every day | ORAL | Status: DC
Start: 2022-06-25 — End: 2022-06-25
  Administered 2022-06-25: 25 mg via ORAL
  Filled 2022-06-25: qty 1

## 2022-06-25 MED ORDER — ENOXAPARIN SODIUM 60 MG/0.6ML IJ SOSY
0.5000 mg/kg | PREFILLED_SYRINGE | INTRAMUSCULAR | Status: DC
  Filled 2022-06-25 (×5): qty 0.6

## 2022-06-25 MED ORDER — CALCIUM CARBONATE 1250 (500 CA) MG PO TABS
1.0000 | ORAL_TABLET | Freq: Every day | ORAL | Status: DC
  Administered 2022-06-26 – 2022-06-27 (×2): 1250 mg via ORAL
  Filled 2022-06-25 (×5): qty 1

## 2022-06-25 MED ORDER — MAGNESIUM OXIDE -MG SUPPLEMENT 400 (240 MG) MG PO TABS
400.0000 mg | ORAL_TABLET | Freq: Every day | ORAL | Status: DC
  Administered 2022-06-25 – 2022-06-29 (×5): 400 mg via ORAL
  Filled 2022-06-25 (×5): qty 1

## 2022-06-25 MED ORDER — FAMOTIDINE 20 MG PO TABS
20.0000 mg | ORAL_TABLET | Freq: Every day | ORAL | Status: DC
  Filled 2022-06-25 (×3): qty 1

## 2022-06-25 MED ORDER — GLIPIZIDE 10 MG PO TABS
20.0000 mg | ORAL_TABLET | Freq: Two times a day (BID) | ORAL | Status: DC
Start: 2022-06-25 — End: 2022-06-26
  Administered 2022-06-25 – 2022-06-26 (×3): 20 mg via ORAL
  Filled 2022-06-25 (×3): qty 2

## 2022-06-25 NOTE — Assessment & Plan Note (Signed)
-   He will be placed on IV Rocephin and we will follow urine culture and sensitivity.

## 2022-06-25 NOTE — ED Notes (Signed)
Pt refuses to allow this rn to put siderails up x2 for fall risk and safety. Pt declines vital signs at this time. Pt states "I need that rail down, I know to call before I get up". Call bell at right side. Pt informed that he is a fall risk and rails need to be up, but pt continues to refuse to have rails up. Pt is alert and oriented x4. Pt informs RN he will use call bell if he has to get up.

## 2022-06-25 NOTE — Assessment & Plan Note (Addendum)
-   This is associated with ataxia. - The patient will be admitted to an observation medical telemetry bed. - We will follow neurochecks every 4 hours for 24 hours. - We will place him on as needed Antivert the possibility of benign positional vertigo. - PT consult will be obtained to assist with his ambulation. - Neurology consult will be obtained. - I notified Dr. Otelia Limes about the patient.

## 2022-06-25 NOTE — Assessment & Plan Note (Signed)
-   We will continue to statin therapy and fish oil.

## 2022-06-25 NOTE — ED Notes (Signed)
Transferred pt to room 30. Pt is complaining of pain at IV site. This RN assessed site. Site is swollen around IV. Infiltration. IV removed at this time.

## 2022-06-25 NOTE — ED Notes (Signed)
Report to stephanie rudd, rn 

## 2022-06-25 NOTE — ED Notes (Signed)
Pt continues to refuse continuous telemetry monitoring and ongoing vital signs. Vitals in real time are WNL. Pt is in no acute distress and is resting, noting that his pain is largely unchanged but his dizziness has improved since receiving PO meds earlier. No issues to report.

## 2022-06-25 NOTE — Progress Notes (Signed)
Physical Therapy Evaluation Patient Details Name: Patrick Patel MRN: [id number] DOB: 12/07/1941 Today's Date: 06/25/2022  History of Present Illness  Pt is a 81 y.o. male with PMH including DM, HTN. Pt presented to ED via EMS with chief concern of dizziness.   Clinical Impression  Pt is a pleasant 80 year old male who was admitted for dizziness. Pt agreeable to PT evaluation of dizziness. Pt believes his dizziness was caused by a new medication because the event occurred 4 hours after taking the first dose and the packaging reports dizziness as a side effect. Unclear etiology of dizziness, as testing is inconclusive. L beating horizontal nystagmus seen during L/R smooth pursuits is indicative of a central issue. VOR was positive with L beating horizontal nystagmus of the L eye which could also indicate a central concern. Skew deviation and VOR-cancellation were negative. Pt reported the dizziness sometimes occurring when he moves his head L/R. However, pt also reported that he can be sitting still and sees the room moving. Based on pts subjective, unlikely BPPV. Dix hallpike negative bilaterally, although noted L beating horizontal nystagmus with R-sided testing. Pt performs bed mobility mod I, sit-to-stand transfers min Ax2, and ambulation with RW and Min A due to unsteadiness in walking. Pt ambulated 80 feet with multiple turns L/R which exacerbated symptoms and caused an unsteady gait pattern requiring Min A. Pt demonstrates deficits with dizziness, strength, balance and endurance. Would benefit from skilled PT to address above deficits and promote optimal return to PLOF. Recommend STR at discharge due to pt current mobility status and not having support at home. This entire session was guided, instructed, and directly supervised by Elizabeth Palau, PT, DPT, GCS.     Recommendations for follow up therapy are one component of a multi-disciplinary discharge planning process, led by the attending  physician.  Recommendations may be updated based on patient status, additional functional criteria and insurance authorization.  Follow Up Recommendations Skilled nursing-short term rehab (<3 hours/day) Can patient physically be transported by private vehicle: Yes    Assistance Recommended at Discharge Intermittent Supervision/Assistance  Patient can return home with the following  A little help with walking and/or transfers;A little help with bathing/dressing/bathroom;Assistance with cooking/housework;Assist for transportation;Help with stairs or ramp for entrance    Equipment Recommendations Rolling walker (2 wheels)  Recommendations for Other Services  OT consult    Functional Status Assessment Patient has had a recent decline in their functional status and demonstrates the ability to make significant improvements in function in a reasonable and predictable amount of time.     Precautions / Restrictions Precautions Precautions: Fall Restrictions Weight Bearing Restrictions: No      Mobility  Bed Mobility Overal bed mobility: Modified Independent             General bed mobility comments: increased time/effort    Transfers Overall transfer level: Needs assistance Equipment used: None Transfers: Sit to/from Stand Sit to Stand: Min assist, +2 physical assistance           General transfer comment: Min Ax2 to stand; no eccentric control to sit    Ambulation/Gait Ambulation/Gait assistance: Min assist Gait Distance (Feet): 80 Feet Assistive device: Rolling walker (2 wheels) Gait Pattern/deviations: Step-through pattern, Wide base of support, Staggering left, Staggering right       General Gait Details: Pt ambulates with step-through, continous gait pattern with excessive hip ER where the heel of his shoes clicked multiple times causing tripping/staggering L/R. Pt also had difficulty with turns  L/R with min A provided on occasion for LOB. Pt reported increased  dizziness with walking.  Stairs            Wheelchair Mobility    Modified Rankin (Stroke Patients Only)       Balance Overall balance assessment: Needs assistance Sitting-balance support: No upper extremity supported, Feet supported Sitting balance-Leahy Scale: Good Sitting balance - Comments: static sitting balance EOB to eat lunch   Standing balance support: Bilateral upper extremity supported, During functional activity, Single extremity supported Standing balance-Leahy Scale: Fair Standing balance comment: pt able to stand at bedside with 1 UE on rail to maintain balance while PT adjusted RW height                             Pertinent Vitals/Pain Pain Assessment Pain Assessment: Faces Faces Pain Scale: Hurts a little bit Pain Location: R knee/ankle Pain Descriptors / Indicators: Aching, Discomfort Pain Intervention(s): Limited activity within patient's tolerance, Monitored during session, Repositioned    Home Living Family/patient expects to be discharged to:: Private residence Living Arrangements: Other relatives (sister) Available Help at Discharge: Other (Comment) (No help at discharge (sister had a stroke a few years ago and is unable to help him)) Type of Home: House Home Access: Stairs to enter Entrance Stairs-Rails: None Entrance Stairs-Number of Steps: 1   Home Layout: One level Home Equipment: Cane - single point      Prior Function Prior Level of Function : Independent/Modified Independent;Driving;History of Falls (last six months)             Mobility Comments: Pt ambulates with SPC in the community for the past 2 years and occasionally utilizes the cane in his home. Pt reported a fall from a chair that broke beneath him, no injuries, but needed EMS to help him get up. ADLs Comments: Modified independent with bathing, feeding, dressing. Pt reported he can't get his shoes on and has been looking for slip-on type shoes.     Hand  Dominance   Dominant Hand: Right    Extremity/Trunk Assessment   Upper Extremity Assessment Upper Extremity Assessment: Overall WFL for tasks assessed    Lower Extremity Assessment Lower Extremity Assessment: Overall WFL for tasks assessed (R LE pain causes mobility deficits at baseline)    Cervical / Trunk Assessment Cervical / Trunk Assessment: Normal  Communication   Communication: No difficulties  Cognition Arousal/Alertness: Awake/alert Behavior During Therapy: WFL for tasks assessed/performed Overall Cognitive Status: Within Functional Limits for tasks assessed                                 General Comments: A&Ox4        General Comments      Exercises Other Exercises Other Exercises: Smooth pursuits superiorly: bilat L beating lateral/upward nystagmus; Smooth pursuits horizontally caused bilat L beating horiztontal nystagmus. Skew deviation negative. However, pt reported diplopia with skew deviation testing. VOR positive for L horizontal nystagmus. VOR-Cancellation negative. Other Exercises: Gilberto Better attempted bilaterally and negative. Due to pt neck pain and decreased flexibility, difficult to assess at proper rate/positioning. L test negative for nystagmus. R tested negative (noted L horizontal nystagmus)   Assessment/Plan    PT Assessment Patient needs continued PT services  PT Problem List Decreased strength;Decreased range of motion;Decreased activity tolerance;Decreased balance;Decreased mobility;Decreased knowledge of use of DME;Decreased safety awareness  PT Treatment Interventions DME instruction;Gait training;Stair training;Functional mobility training;Therapeutic activities;Therapeutic exercise;Balance training;Patient/family education;Modalities    PT Goals (Current goals can be found in the Care Plan section)  Acute Rehab PT Goals Patient Stated Goal: to get better PT Goal Formulation: With patient Time For Goal Achievement:  07/09/22 Potential to Achieve Goals: Good    Frequency Min 2X/week     Co-evaluation               AM-PAC PT "6 Clicks" Mobility  Outcome Measure Help needed turning from your back to your side while in a flat bed without using bedrails?: A Little Help needed moving from lying on your back to sitting on the side of a flat bed without using bedrails?: A Little Help needed moving to and from a bed to a chair (including a wheelchair)?: A Little Help needed standing up from a chair using your arms (e.g., wheelchair or bedside chair)?: A Little Help needed to walk in hospital room?: A Little Help needed climbing 3-5 steps with a railing? : A Lot 6 Click Score: 17    End of Session Equipment Utilized During Treatment: Gait belt Activity Tolerance: Patient tolerated treatment well Patient left: in bed;with call bell/phone within reach;with bed alarm set Nurse Communication: Mobility status PT Visit Diagnosis: Unsteadiness on feet (R26.81);Other abnormalities of gait and mobility (R26.89);Dizziness and giddiness (R42)    Time: 5465-0354 PT Time Calculation (min) (ACUTE ONLY): 44 min   Charges:             Jaley Yan, SPT  Kenzleigh Sedam 06/25/2022, 2:16 PM

## 2022-06-25 NOTE — Assessment & Plan Note (Signed)
-   We will place on IV Rocephin and follow urine culture and sensitivity. 

## 2022-06-25 NOTE — Hospital Course (Signed)
  Patrick Patel is a 81 y.o. Caucasian male with medical history significant for type 2 diabetes mellitus, hypertension and dyslipidemia, presented to the emergency room with acute onset of dizziness with ataxia.  He was seen for right lower extremity pain at his PCPs doctor and was prescribed meloxicam.  No paresthesias or focal muscle weakness.  In the ED, BP was 153/67 with otherwise normal vital signs.  Labs revealed a CMP remarkable for mild hypokalemia of 3.4 and hyperglycemia of 205 with a BUN of 24.  High sensitive troponin I was 9 and later 10. CBC was within normal.  UA showed more than 500 glucose, 5 ketones and trace leukocytes with 6-10 WBCs and many bacteria.  EKG showed atrial fibrillation with controlled ventricular response.  CTA of the head and neck showed negative findings for large vessel occlusion.  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 The patient was given 25 mg of p.o. meclizine and 50 mg of p.o. tramadol and was placed in observation

## 2022-06-25 NOTE — ED Notes (Signed)
Pt awake, sitting up in bed in no acute distress.call bell at right side.

## 2022-06-25 NOTE — H&P (Addendum)
PATIENT NAME: Patrick Patel    MR#:  [id number]  DATE OF BIRTH:  Apr 22, 1941  DATE OF ADMISSION:  06/24/2022  PRIMARY CARE PHYSICIAN: [name], MD   Patient is coming from: Home  REQUESTING/REFERRING PHYSICIAN: Lurline Hare, MD  CHIEF COMPLAINT:   Chief Complaint  Patient presents with   Dizziness    HISTORY OF PRESENT ILLNESS:  Patrick Patel is a 81 y.o. Caucasian male with medical history significant for type 2 diabetes mellitus, hypertension and dyslipidemia, presented to the emergency room with acute onset of dizziness with ataxia.  He was seen for right lower extremity pain at his PCPs doctor and was prescribed meloxicam.  He took it around 5 PM and at 9 PM and went to bed.  He woke up at 10 PM with mild dizziness which has been worsening.[name]  He felt his eyes were darting back and forth which was making it difficult for him to ambulate.  No paresthesias or focal muscle weakness.  No nausea or vomiting or abdominal pain.  No chest pain or palpitations.  Due to pain in the right leg he feels its weak.  No recent trauma or falls.  No dysuria, oliguria or hematuria or flank pain.  No bleeding diathesis.  ED Course: When he came to the ER, BP was 153/67 with otherwise normal vital signs.  Labs revealed a CMP remarkable for mild hypokalemia of 3.4 and hyperglycemia of 205 with a BUN of 24.  High sensitive troponin I was 9 and later 10. CBC was within normal.  UA showed more than 500 glucose, 5 ketones and trace leukocytes with 6-10 WBCs and many bacteria. EKG as reviewed by me : Initial EKG showed atrial fibrillation with controlled ventricular sponsor 68 with right bundle branch block.  Repeat EKG showed normal sinus rhythm with a rate of 80 with short PR and [place] and right bundle branch block Imaging: CTA of the head and neck revealed the following: 1. Negative for large vessel occlusion.   2. Positive for abundant bilateral carotid atherosclerosis  through the ICA siphons. But no stenosis greater than 50% is identified.   3. Less pronounced posterior circulation atherosclerosis. The left vertebral artery arises directly from the arch.   4. No acute intracranial abnormality by CT. Mild for age nonspecific cerebral white matter changes.   5. Aortic Atherosclerosis.  Brain MRI without contrast revealed mild age-related cerebral atrophy with chronic small vessel ischemic disease with no acute intracranial abnormalities.  The patient was given 25 mg of p.o. meclizine and 50 mg of p.o. tramadol.  The patient will be admitted to a medical telemetry observation bed for further evaluation and management.  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 All pertinent systems were reviewed above. Constitutional, HEENT, cardiovascular, respiratory, GI, GU, musculoskeletal, neuro, psychiatric, endocrine, integumentary and hematologic systems were reviewed and are otherwise negative/unremarkable except for positive findings mentioned above in the HPI.   MEDICATIONS AT HOME:   Prior to Admission medications   Medication Sig Start Date End Date Taking? Authorizing Provider   acetaminophen (TYLENOL) 500 MG tablet Take 1,000 mg by mouth 2 (two) times daily at 10 AM and 5 PM.    Yes [provider]  alfuzosin (UROXATRAL) 10 MG 24 hr tablet TAKE 1 TABLET BY MOUTH  DAILY WITH BREAKFAST 10/06/21  Yes [name], MD  aspirin EC 325 MG tablet Take 650 mg by mouth daily.    Yes [provider]  Calcium-Magnesium-Zinc [phone] MG TABS Take 4 tablets by mouth daily.   Yes [provider]  chlorthalidone (HYGROTON) 50 MG tablet TAKE 1 TABLET BY MOUTH  DAILY 12/06/21  Yes [name], MD  Cranberry 125 MG TABS Take 4 tablets by mouth daily.   Yes [provider]  diphenhydrAMINE (BENADRYL) 25 MG tablet Take 50 mg by mouth at bedtime as needed for sleep.   Yes [provider]  famotidine (PEPCID) 20 MG tablet TAKE 1 TABLET BY MOUTH AT  BEDTIME 04/24/22  Yes [name], MD  glipiZIDE (GLUCOTROL) 10 MG tablet TAKE 2 TABLETS BY MOUTH  TWICE DAILY 10/06/21  Yes [name], MD  insulin NPH Human (HUMULIN N) 100 UNIT/ML injection INJECT SUBCUTANEOUSLY 20  UNITS IN THE MORNING AND 15 UNITS AT BEDTIME 02/19/22  Yes Dutch Quint B, FNP  JARDIANCE 25 MG TABS tablet TAKE 1 TABLET BY MOUTH  DAILY 04/06/22  Yes [name], MD  meloxicam (MOBIC) 7.5 MG tablet Take 1 tablet (7.5 mg total) by mouth daily. 06/23/22  Yes Dutch Quint B, FNP  metFORMIN (GLUCOPHAGE-XR) 500 MG 24 hr tablet TAKE 4 TABLETS BY MOUTH  ONCE DAILY WITH BREAKFAST 10/19/21  Yes [name], MD  Multiple Vitamins-Minerals (MULTIVITAMIN WITH MINERALS) tablet Take 2 tablets by mouth daily.   Yes [provider]  Omega-3 Fatty Acids (FISH OIL) 1000 MG CPDR Take 4 capsules by mouth every other day.   Yes [provider]  rosuvastatin (CRESTOR) 20 MG tablet TAKE 1 TABLET BY MOUTH  DAILY 10/06/21  Yes [name], MD  Vitamin D, Ergocalciferol, (DRISDOL) 1.25 MG (50000 UNIT) CAPS capsule Take 1 capsule (50,000 Units total)  by mouth every 7 (seven) days. 09/01/20  Yes [name], MD  ACCU-CHEK AVIVA PLUS test strip USE TO CHECK BLOOD GLUCOSE  3 TIMES DAILY 02/28/22   Dutch Quint B, FNP  Accu-Chek Softclix Lancets lancets Use as instructedUSE TO CHECK BLOOD GLUCOSE 3 TIMES DAILY 02/03/22   [name], MD  blood glucose meter kit and supplies KIT Dispense Accu-Chek Aviva plus. Check once daily as directed. For ICD 10 code E11.9. 12/14/16   [name], MD  Continuous Blood Gluc Receiver (FREESTYLE LIBRE 2 READER) DEVI Use at least every 8 hours to read your blood glucose 08/09/21   [name], MD  Continuous Blood Gluc Sensor (FREESTYLE LIBRE 14 DAY SENSOR) MISC Apply every 14 days, used to check glucose at least 3 times a day, diagnosis code E11.9 08/03/21   [name],  MD  Dulaglutide (TRULICITY) 5.27 PO/2.4MP SOPN Inject 0.75 mg into the skin once a week. 05/19/22   [name], MD  Insulin Syringe-Needle U-100 (BD INSULIN SYRINGE U/F) 31G X 5/16" 0.3 ML MISC USE TWICE DAILY WITH  INSULIN 02/28/22   Dutch Quint B, FNP  triamcinolone cream (KENALOG) 0.1 % APPLY CREAM EXTERNALLY 2  TIMES DAILY AS NEEDED 09/17/19   [name], MD      VITAL SIGNS:  Blood pressure 129/84, pulse 76, temperature 98.1 F (36.7 C), temperature source Oral, resp. rate 16, height 5' 8"  (1.727 m), weight 90.7 kg, SpO2 97 %.  PHYSICAL EXAMINATION:  Physical Exam  GENERAL:  81 y.o.-year-old Caucasian male patient lying in the bed with no acute distress.  EYES: Pupils equal, round, reactive to light and accommodation. No scleral icterus. Extraocular muscles intact.  HEENT: Head atraumatic, normocephalic. Oropharynx and nasopharynx clear.  NECK:  Supple, no jugular venous distention. No thyroid enlargement, no tenderness.  LUNGS: Normal breath sounds bilaterally, no wheezing, rales,rhonchi or crepitation. No use of accessory muscles of respiration.  CARDIOVASCULAR: Regular rate and rhythm, S1, S2  normal. No murmurs, rubs, or gallops.  ABDOMEN: Soft, nondistended, nontender. Bowel sounds present. No organomegaly or mass.  EXTREMITIES: No pedal edema, cyanosis, or clubbing.  NEUROLOGIC: Cranial nerves II through XII are intact. Muscle strength 5/5 in all extremities. Sensation intact. Gait not checked.  He had mild nystagmus with head rotation. PSYCHIATRIC: The patient is alert and oriented x 3.  Normal affect and good eye contact. SKIN: No obvious rash, lesion, or ulcer.   LABORATORY PANEL:   CBC Recent Labs  Lab 06/25/22 0350  WBC 9.8  HGB 15.0  HCT 45.0  PLT 233   ------------------------------------------------------------------------------------------------------------------  Chemistries  Recent Labs  Lab 06/24/22 1117 06/25/22 0120 06/25/22 0350  NA 139  --  139  K 3.4*  --  3.4*  CL 104  --  105  CO2 25  --  24  GLUCOSE 205*  --  148*  BUN 24*  --  24*  CREATININE 0.81   < > 0.91  CALCIUM 8.9  --  9.2  MG 2.3  --   --   AST 19  --   --   ALT 15  --   --   ALKPHOS 71  --   --   BILITOT 0.9  --   --    < > = values in this interval not displayed.   ------------------------------------------------------------------------------------------------------------------  Cardiac Enzymes No results for input(s): "TROPONINI" in the last 168 hours. ------------------------------------------------------------------------------------------------------------------  RADIOLOGY:  MR BRAIN WO CONTRAST  Result Date: 06/24/2022 CLINICAL DATA:  Follow-up examination for stroke. EXAM: MRI HEAD WITHOUT CONTRAST TECHNIQUE: Multiplanar, multiecho pulse sequences of the brain and surrounding structures were obtained without intravenous contrast. COMPARISON:  Prior CT from earlier the same day. 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 No acute or subacute infarct. Gray-white matter differentiation maintained. No areas of chronic  cortical infarction. No acute or chronic intracranial blood products. No mass lesion, midline shift or mass effect no hydrocephalus or extra-axial fluid collection. Pituitary gland and suprasellar region within normal limits. Vascular: Major intracranial vascular flow voids are maintained. Skull and upper cervical spine: Craniocervical junction normal. Bone marrow signal intensity normal. No scalp soft tissue abnormality. Sinuses/Orbits: Globes orbital soft tissues within normal limits. Paranasal sinuses and mastoid air cells are clear. Other: None. IMPRESSION: 1. No acute intracranial abnormality. 2. Mild age-related  cerebral atrophy with chronic small vessel ischemic disease. Electronically Signed   By: Jeannine Boga M.D.   On: 06/24/2022 23:23   CT ANGIO HEAD NECK W WO CM  Result Date: 06/24/2022 CLINICAL DATA:  81 year old male with dizziness. EXAM: CT ANGIOGRAPHY HEAD AND NECK TECHNIQUE: Multidetector CT imaging of the head and neck was performed using the standard protocol during bolus administration of intravenous contrast. Multiplanar CT image reconstructions and MIPs were obtained to evaluate the vascular anatomy. Carotid stenosis measurements (when applicable) are obtained utilizing NASCET criteria, using the distal internal carotid diameter as the denominator. RADIATION DOSE REDUCTION: This exam was performed according to the departmental dose-optimization program which includes automated exposure control, adjustment of the mA and/or kV according to patient size and/or use of iterative reconstruction technique. CONTRAST:  63m OMNIPAQUE IOHEXOL 350 MG/ML SOLN COMPARISON:  Report of head CT 03/27/2010 (no images available). FINDINGS: CT HEAD Brain: Cavum septum pellucidum, normal variant. Cerebral volume is probably within normal limits for age. No midline shift, ventriculomegaly, mass effect, evidence of mass lesion, intracranial hemorrhage or evidence of cortically based acute infarction.  Patchy bilateral cerebral white matter hypodensity is mild for age. No cortical encephalomalacia identified. Calvarium and skull base: No acute osseous abnormality identified. Paranasal sinuses: Visualized paranasal sinuses and mastoids are clear. Tympanic cavities appear clear. Orbits: Visualized orbits and scalp soft tissues are within normal limits. No acute osseous abnormality identified. CTA NECK Skeleton: Exaggerated cervical lordosis. Multilevel upper thoracic ankylosis related to flowing endplate osteophytes or syndesmophytes. No acute osseous abnormality identified. Upper chest: Increased AP dimension to the chest with mild dependent atelectasis along the major fissures. No superior mediastinal lymphadenopathy. Other neck: Negative. Aortic arch: Calcified aortic atherosclerosis. Four vessel arch configuration, the left vertebral artery arises directly from the arch on series 9, image 155. Right carotid system: Mild brachiocephalic artery plaque. Mild to moderate right CCA calcified plaque without stenosis. Soft plaque in the ventral vessel at the level of the hyoid, no significant stenosis. Calcified plaque at the right ICA origin and bulb. Less than 50 % stenosis with respect to the distal vessel. Left carotid system: Similar left CCA atherosclerosis without significant stenosis. Bulky calcified and soft atherosclerosis of the left ICA origin and especially the bulb. Up to 50 % stenosis with respect to the distal vessel at the distal bulb. Tortuous left ICA with additional calcified plaque just below the skull base. Vertebral arteries: Proximal right subclavian and right vertebral artery origin plaque without stenosis. Right vertebral artery otherwise patent and normal to the skull base. Left vertebral artery arises directly from the arch with calcified plaque at its origin but only mild origin stenosis. Fairly codominant left vertebral artery than is patent to the skull base with no additional plaque or  stenosis. CTA HEAD Posterior circulation: Codominant vertebral V4 segments are patent to the vertebrobasilar junction. There is mild left V3 calcified plaque but no stenosis. Patent PICA origins. Patent basilar artery without stenosis. Patent SCA origins. Fetal type bilateral PCA origins. Bilateral PCA branches are patent. There is mild right P2 irregularity and stenosis. Anterior circulation: Both ICA siphons are patent. Extensive cavernous and supraclinoid segment left siphon calcified plaque but only mild left siphon stenosis. Normal left ophthalmic and posterior communicating artery origins. Similar extensive right siphon calcified plaque with mild supraclinoid stenosis. Normal right posterior communicating artery origin. Patent carotid termini, MCA and ACA origins. Right ACA A1 is dominant. Normal anterior communicating artery. Bilateral ACA branches are tortuous and otherwise within normal limits.  Left MCA M1 segment and bifurcation are patent without stenosis. Right MCA M1 segment is mildly tortuous and irregular but without stenosis to the bifurcation. Bilateral MCA branches are within normal limits. Venous sinuses: Early contrast timing, not well evaluated. Anatomic variants: Fetal type bilateral PCA origins. Dominant right ACA A1. Left vertebral artery arises directly from the arch. Review of the MIP images confirms the above findings IMPRESSION: 1. Negative for large vessel occlusion. 2. 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 But no stenosis greater than 50% is identified. 3. Less pronounced posterior circulation atherosclerosis. The left vertebral artery arises directly from the arch. 4. No acute intracranial abnormality by CT. Mild for age nonspecific cerebral white matter changes. 5. Aortic Atherosclerosis (ICD10-I70.0). Electronically Signed   By: Genevie Ann M.D.   On: 06/24/2022 13:08      IMPRESSION AND PLAN:  Assessment and Plan: * Dizziness - This is  associated with ataxia. - The patient will be admitted to an observation medical telemetry bed. - We will follow neurochecks every 4 hours for 24 hours. - We will place him on as needed Antivert the possibility of benign positional vertigo. - PT consult will be obtained to assist with his ambulation. - Neurology consult will be obtained. - I notified Dr. Cheral Marker about the patient.  Acute lower UTI - He will be placed on IV Rocephin and we will follow urine culture and sensitivity.  Type 2 diabetes mellitus without complications (HCC) - We will place the patient on supplement coverage with NovoLog. - We will continue his basal coverage. - We will hold off metformin and continue Jardiance.  Dyslipidemia - We will continue to statin therapy and fish oil.   DVT prophylaxis: Lovenox.  Advanced Care Planning:  Code Status: full code.  Family Communication:  The plan of care was discussed in details with the patient (and family). I answered all questions. The patient agreed to proceed with the above mentioned plan. Further management will depend upon hospital course. Disposition Plan: Back to previous home environment Consults called: none.  All the records are reviewed and case discussed with ED provider.  Status is: Observation   I certify that at the time of admission, it is my clinical judgment that the patient will require inpatient hospital care extending more than 2 midnights.                            Dispo: The patient is from: Home              Anticipated d/c is to: Home              Patient currently is not medically stable to d/c.              Difficult to place patient: No  Christel Mormon M.D on 06/25/2022 at 4:13 AM  [facility]   From 7 PM-7 AM, contact night-coverage [email or web address]  CC: Primary care physician; [name], MD

## 2022-06-25 NOTE — Assessment & Plan Note (Signed)
-   We will place the patient on supplement coverage with NovoLog. - We will continue his basal coverage. - We will hold off metformin and continue Jardiance.

## 2022-06-25 NOTE — ED Notes (Signed)
Rn to bedside to introduce self to pt. Pt awake and alert and in no distress.  °

## 2022-06-25 NOTE — Progress Notes (Signed)
Same day note  Patient seen and examined at bedside.  Patient was admitted to the hospital for continuous with ataxia.  At the time of my evaluation, patient complains of floor  moving back and forth.  Physical examination reveals elderly male not in obvious distress.  Lying supine in bed.  Laboratory data and imaging was reviewed  Assessment and Plan.  * Dizziness associated with ataxia. Patient does have positional symptoms.  Possibility of benign positional vertigo.  We will get PT evaluation.  Neurology was notified from the ED.  Continue neurochecks for 24 hours.  MRI of the brain without any acute stroke continue meclizine.  Might need vestibular PT.  TSH of 3.2.  LFTs within normal limits.  WBC without leukocytosis.  Mild hypokalemia.  Will replace orally.  Check levels in AM.  Acute lower UTI Has mild symptoms of dysuria.  Continue IV Rocephin and we will follow urine culture and sensitivity.  Type 2 diabetes mellitus with hyperglycemia (HCC) Continue basal and sliding scale insulin.  Hold off with metformin and Jardiance  Dyslipidemia Continue statin therapy and fish oil.  No Charge  Signed,  Tenny Craw, MD [facility]

## 2022-06-26 DIAGNOSIS — N390 Urinary tract infection, site not specified: Secondary | ICD-10-CM | POA: Diagnosis not present

## 2022-06-26 DIAGNOSIS — H8110 Benign paroxysmal vertigo, unspecified ear: Secondary | ICD-10-CM | POA: Diagnosis not present

## 2022-06-26 DIAGNOSIS — E785 Hyperlipidemia, unspecified: Secondary | ICD-10-CM | POA: Diagnosis not present

## 2022-06-26 DIAGNOSIS — R42 Dizziness and giddiness: Secondary | ICD-10-CM | POA: Diagnosis not present

## 2022-06-26 LAB — GLUCOSE, CAPILLARY
Glucose-Capillary: 167 mg/dL — ABNORMAL HIGH (ref 70–99)
Glucose-Capillary: 249 mg/dL — ABNORMAL HIGH (ref 70–99)
Glucose-Capillary: 186 mg/dL — ABNORMAL HIGH (ref 70–99)
Glucose-Capillary: 213 mg/dL — ABNORMAL HIGH (ref 70–99)

## 2022-06-26 LAB — BASIC METABOLIC PANEL
Anion gap: 10 (ref 5–15)
BUN: 20 mg/dL (ref 8–23)
CO2: 23 mmol/L (ref 22–32)
Calcium: 8.9 mg/dL (ref 8.9–10.3)
Chloride: 105 mmol/L (ref 98–111)
Creatinine, Ser: 0.87 mg/dL (ref 0.61–1.24)
GFR, Estimated: >60 mL/min (ref >60)
Glucose, Bld: 149 mg/dL — ABNORMAL HIGH (ref 70–99)
Potassium: 3.8 mmol/L (ref 3.5–5.1)
Sodium: 138 mmol/L (ref 135–145)

## 2022-06-26 LAB — URINE CULTURE: Culture: <10000 — AB

## 2022-06-26 LAB — SALICYLATE LEVEL: Salicylate Lvl: <7 mg/dL — ABNORMAL LOW (ref 7.0–30.0)

## 2022-06-26 LAB — HEMOGLOBIN AND HEMATOCRIT, BLOOD
HCT: 43.8 % (ref 39.0–52.0)
Hemoglobin: 14.8 g/dL (ref 13.0–17.0)

## 2022-06-26 MED ORDER — PHENAZOPYRIDINE HCL 100 MG PO TABS
100.0000 mg | ORAL_TABLET | Freq: Three times a day (TID) | ORAL | Status: AC
Start: 2022-06-26 — End: 2022-06-28
  Administered 2022-06-26 – 2022-06-28 (×10): 100 mg via ORAL
  Filled 2022-06-26 (×10): qty 1

## 2022-06-26 MED ORDER — POTASSIUM CHLORIDE CRYS ER 20 MEQ PO TBCR
40.0000 meq | EXTENDED_RELEASE_TABLET | Freq: Once | ORAL | Status: AC
  Administered 2022-06-26: 40 meq via ORAL
  Filled 2022-06-26: qty 2

## 2022-06-26 NOTE — Evaluation (Signed)
Occupational Therapy Evaluation Patient Details Name: Patrick Patel MRN: [id number] DOB: Mar 22, 1941 Today's Date: 06/26/2022   History of Present Illness Pt is a 81 y.o. male with PMH including DM, HTN. Pt presented to ED via EMS with chief concern of dizziness.   Clinical Impression   Patrick Patel presents with generalized weakness, limited endurance, and impaired balance. He lives with his sister in a single-story home, 1 STE, and PTA has been Mod I in all fxl mobility and IADL. He drives, does his own shopping and housecleaning. 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 He reports ambulating in community with a SPC and, until ~ 2 months ago, had not used DME in his home, but states that in recent months he has been electing on occasion to use the cane within his home, 2/2 to feeling less stable in standing/ambulation. He denies any falls in previous year. He reports he rarely wears socks, 2/2 difficulties with donning/doffing. During today's evaluation, pt denies pain, demonstrates slightly limited ROM at R shoulder and at R knee and ankle, which he describes as chronic and waxing/waning. Pt reports that he came to ED with significant dizziness, but that this is much reduced at present, now occurring only when he moves his head quickly. He continues to endorse impaired, but improving, visual disturbance, with static objects appearing to be drifting slowly to left. Pt demonstrates L beating horizontal nystagmus during L/R smooth pursuits and oblique nystagmus up and to the L with upward gaze. Pt is able to perform bed mobility with Mod I, requires Min A for sit<>stand transfers from bed and toilet, Mod A for LB dressing, Min A with 1-handed UE support for grooming in standing, and close SUPV for safety during all OOB mobility, 2/2 impaired balance. Pt will benefit from ongoing OT while hospitalized, with opportunity to trial AE for LB dressing. Given pt's impaired balance and  vision at present, recommend DC to SNF, although pt states his strong desire to continue his improvement in hospital and to be able to DC to his home. Will reassess DC recs at next session.    Recommendations for follow up therapy are one component of a multi-disciplinary discharge planning process, led by the attending physician.  Recommendations may be updated based on patient status, additional functional criteria and insurance authorization.   Follow Up Recommendations  Skilled nursing-short term rehab (<3 hours/day)    Assistance Recommended at Discharge Intermittent Supervision/Assistance  Patient can return home with the following A little help with walking and/or transfers;A little help with bathing/dressing/bathroom;Help with stairs or ramp for entrance;Assistance with cooking/housework;Assist for transportation    Functional Status Assessment  Patient has had a recent decline in their functional status and demonstrates the ability to make significant improvements in function in a reasonable and predictable amount of time.  Equipment Recommendations       Recommendations for Other Services       Precautions / Restrictions Precautions Precautions: Fall Restrictions Weight Bearing Restrictions: No      Mobility Bed Mobility Overal bed mobility: Modified Independent                  Transfers Overall transfer level: Needs assistance Equipment used: Rolling walker (2 wheels) Transfers: Sit to/from Stand, Bed to chair/wheelchair/BSC Sit to Stand: Min assist     Step pivot transfers: Min assist     General transfer comment: Min A to stand from elevated height and using body momentum to  come up into standing      Balance Overall balance assessment: Needs assistance Sitting-balance support: No upper extremity supported, Feet supported Sitting balance-Leahy Scale: Good     Standing balance support: Bilateral upper extremity supported, During functional  activity, Single extremity supported Standing balance-Leahy Scale: Fair Standing balance comment: Able to perform grooming in standing at sink, requiring 1 UE on cabinet at all times for maintaining steady standing balance                           ADL either performed or assessed with clinical judgement   ADL Overall ADL's : Needs assistance/impaired Eating/Feeding: Modified independent   Grooming: Wash/dry hands;Wash/dry face;Oral care;Min guard Grooming Details (indicate cue type and reason): close SUPV for safety             Lower Body Dressing: Moderate assistance Lower Body Dressing Details (indicate cue type and reason): donning socks/shoes Toilet Transfer: Minimal assistance;Regular Toilet;Grab bars;Rolling walker (2 wheels)   Toileting- Clothing Manipulation and Hygiene: Min guard;Sit to/from stand               Vision Patient Visual Report: Other (comment) Additional Comments: L beating nystagmus; pt reports objects intermittently appear to be drifting to left     Perception     Praxis      Pertinent Vitals/Pain Pain Assessment Pain Assessment: No/denies pain Faces Pain Scale: Hurts a little bit Pain Location: R knee/ankle Pain Descriptors / Indicators: Aching, Discomfort Pain Intervention(s): Repositioned, Monitored during session     Hand Dominance Right   Extremity/Trunk Assessment Upper Extremity Assessment Upper Extremity Assessment: Overall WFL for tasks assessed;RUE deficits/detail RUE Deficits / Details: intermittent limited ROM at R shoulder   Lower Extremity Assessment Lower Extremity Assessment: Overall WFL for tasks assessed;RLE deficits/detail RLE Deficits / Details: pain, mild swelling, limited  ROM in R knee   Cervical / Trunk Assessment Cervical / Trunk Assessment: Normal   Communication Communication Communication: No difficulties   Cognition Arousal/Alertness: Awake/alert Behavior During Therapy: WFL for tasks  assessed/performed Overall Cognitive Status: Within Functional Limits for tasks assessed                                       General Comments       Exercises Other Exercises Other Exercises: Smooth pursuits horizontally caused bilat L beating horiztontal nystagmus. Other Exercises: Educ re: AE for LB dressing, safe use of RW   Shoulder Instructions      Home Living Family/patient expects to be discharged to:: Private residence Living Arrangements: Other relatives (sister) Available Help at Discharge: Other (Comment) (no help available at DC) Type of Home: House Home Access: Stairs to enter [employer] of Steps: 1 Entrance Stairs-Rails: None Home Layout: One level     Bathroom Shower/Tub: Chief Strategy Officer: Standard     Home Equipment: Cane - single point          Prior Functioning/Environment Prior Level of Function : Independent/Modified Independent             Mobility Comments: Pt ambulates with SPC in the community for the past 2 years and occasionally utilizes the cane in his home. ADLs Comments: Modified independent with bathing, feeding, dressing. Pt reported he can't get his shoes on and has been looking for slip-on type shoes.  OT Problem List: Impaired balance (sitting and/or standing);Decreased activity tolerance;Decreased knowledge of use of DME or AE      OT Treatment/Interventions: Self-care/ADL training;Therapeutic exercise;Patient/family education;Balance training;Energy conservation;Therapeutic activities;DME and/or AE instruction    OT Goals(Current goals can be found in the care plan section) Acute Rehab OT Goals Patient Stated Goal: to get back to being able to do everything for himself OT Goal Formulation: With patient Time For Goal Achievement: 07/10/22 Potential to Achieve Goals: Good ADL Goals Pt Will Perform Grooming: with modified independence (in standing at sink, w/o UE support  and w/o LOB) Pt Will Perform Lower Body Dressing: with modified independence;sitting/lateral leans (using AE as appropriate) Pt Will Transfer to Toilet: with modified independence;stand pivot transfer;ambulating;regular height toilet  OT Frequency: Min 2X/week    Co-evaluation              AM-PAC OT "6 Clicks" Daily Activity     Outcome Measure Help from another person eating meals?: None Help from another person taking care of personal grooming?: A Little Help from another person toileting, which includes using toliet, bedpan, or urinal?: A Little Help from another person bathing (including washing, rinsing, drying)?: A Little Help from another person to put on and taking off regular upper body clothing?: A Little Help from another person to put on and taking off regular lower body clothing?: A Lot 6 Click Score: 18   End of Session Equipment Utilized During Treatment: Rolling walker (2 wheels)  Activity Tolerance: Patient tolerated treatment well Patient left: in bed;with call bell/phone within reach  OT Visit Diagnosis: Unsteadiness on feet (R26.81);Muscle weakness (generalized) (M62.81)                Time: 8309-4076 OT Time Calculation (min): 39 min Charges:  OT General Charges $OT Visit: 1 Visit OT Evaluation $OT Eval Moderate Complexity: 1 Mod OT Treatments $Self Care/Home Management : 38-52 mins Latina Craver, PhD, MS, OTR/L 06/26/22, 3:02 PM

## 2022-06-26 NOTE — Progress Notes (Addendum)
PROGRESS NOTE    Patrick Patel  [id number] DOB: 26-Dec-1940 DOA: 06/24/2022 PCP: Glori Luis, MD    Brief Narrative:   Patrick Patel is a 81 y.o. Caucasian male with medical history significant for type 2 diabetes mellitus, hypertension and dyslipidemia, presented to the emergency room with acute onset of dizziness with ataxia.  He was seen for right lower extremity pain at his PCPs doctor and was prescribed meloxicam.  After meloxicam he indicated this symptoms.  Of note patient was taking significant amount of aspirin at home and was told not to take by his primary care provider.  In the ED, BP was 153/67 with otherwise normal vital signs.  Labs revealed a CMP remarkable for mild hypokalemia of 3.4 and hyperglycemia of 205 with a BUN of 24.  High sensitive troponin I was 9 and later 10. CBC was within normal.  UA showed more than 500 glucose, 5 ketones and trace leukocytes with 6-10 WBCs and many bacteria.  EKG showed atrial fibrillation with controlled ventricular response.  CTA of the head and neck showed negative findings for large vessel occlusion.  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 The patient was given 25 mg of p.o. meclizine and 50 mg of p.o. tramadol and was admitted to hospital for further evaluation and treatment.    Assessment and Plan:  Principal Problem:   Dizziness Active Problems:   Acute lower UTI   Type 2 diabetes mellitus without complications (HCC)   Dyslipidemia   Dizziness associated with ataxia.   Patient does have positional symptoms with constant nystagmus and gait dysfunction.[name]  Possibility of vestibular neuronitis.[name]  Physical therapy on board recommended skilled nursing facility at this time.  MRI of the brain without any acute stroke continue meclizine. .  TSH of 3.2.  LFTs within normal limits.  WBC without leukocytosis.  Communicated with Dr. Elenore Rota ENT regarding this.    Mild hypokalemia.  Improved after replacement.  Latest potassium  of 3.8.   Acute lower UTI Has symptoms of dysuria, slightly improved..  Continue IV Rocephin urine culture with insignificant growth    Type 2 diabetes mellitus with hyperglycemia (HCC) Continue basal and sliding scale insulin.  Hold off with metformin and Jardiance for now.   Dyslipidemia Continue statin therapy and fish oil.   Deconditioning, debility, generalized weakness.  Physical therapy has recommended skilled nursing facility placement at this time   DVT prophylaxis:   Lovenox subcu   Code Status:     Code Status: Full Code  Disposition: Skilled nursing facility as per PT recommendation.  Patient is willing to go to rehabilitation   Status is: Inpatient  Remains inpatient appropriate because: Need for rehabilitation   Family Communication:  I tried to reach the patient's sister on the listed phone number at home and cell phone but was unable to reach her.  Consultants:  ENT  Procedures:  None  Antimicrobials:  Rocephin IV 06/25/2022  Anti-infectives (From admission, onward)    Start     Dose/Rate Route Frequency Ordered Stop   06/25/22 0400  cefTRIAXone (ROCEPHIN) 1 g in sodium chloride 0.9 % 100 mL IVPB        1 g 200 mL/hr over 30 Minutes Intravenous Every 24 hours 06/25/22 0325          Subjective: Today, patient was seen and examined at bedside.  Patient states that he feels a little better with his dizziness lightheadedness or off-balance but has generalized weakness.  Objective: Vitals:  06/26/22 0038 06/26/22 0550 06/26/22 0841 06/26/22 1640  BP: 122/63 (!) 129/59 (!) 141/67 (!) 128/57  Pulse: 73 75 87 91  Resp: 18 18 20 20   Temp: 98.4 F (36.9 C) (!) 97.4 F (36.3 C) 99 F (37.2 C) 98.4 F (36.9 C)  TempSrc:    Oral  SpO2: 95% 99% 97% 98%  Weight:      Height:        Intake/Output Summary (Last 24 hours) at 06/26/2022 1729 Last data filed at 06/26/2022 1520 Gross per 24 hour  Intake 2052.76 ml  Output 2050 ml  Net 2.76 ml    Filed Weights   06/24/22 1104  Weight: 90.7 kg    Physical Examination: Body mass index is 30.41 kg/m.   General: Obese built, not in obvious distress HENT:   No scleral pallor or icterus noted. Oral mucosa is moist.  Horizontal nystagmus noted. Chest:  Clear breath sounds.  Diminished breath sounds bilaterally. No crackles or wheezes.  CVS: S1 &S2 heard. No murmur.  Regular rate and rhythm. Abdomen: Soft, nontender, nondistended.  Bowel sounds are heard.   Extremities: No cyanosis, clubbing or edema.  Peripheral pulses are palpable. Psych: Alert, awake and oriented, normal mood CNS:  No cranial nerve deficits.  Power equal in all extremities.  Gait could not be tested. Skin: Warm and dry.  No rashes noted.  Data Reviewed:   CBC: Recent Labs  Lab 06/24/22 1117 06/25/22 0120 06/25/22 0350 06/26/22 0342  WBC 10.1 10.1 9.8  --   NEUTROABS 7.1  --   --   --   HGB 14.9 15.3 15.0 14.8  HCT 44.7 45.1 45.0 43.8  MCV 88.7 88.3 89.6  --   PLT 215 228 233  --     Basic Metabolic Panel: Recent Labs  Lab 06/24/22 1117 06/25/22 0120 06/25/22 0350 06/26/22 0342  NA 139  --  139 138  K 3.4*  --  3.4* 3.8  CL 104  --  105 105  CO2 25  --  24 23  GLUCOSE 205*  --  148* 149*  BUN 24*  --  24* 20  CREATININE 0.81 0.89 0.91 0.87  CALCIUM 8.9  --  9.2 8.9  MG 2.3  --   --   --     Liver Function Tests: Recent Labs  Lab 06/24/22 1117  AST 19  ALT 15  ALKPHOS 71  BILITOT 0.9  PROT 7.2  ALBUMIN 3.6     Radiology Studies: MR BRAIN WO CONTRAST  Result Date: 06/24/2022 CLINICAL DATA:  Follow-up examination for stroke. EXAM: MRI HEAD WITHOUT CONTRAST TECHNIQUE: Multiplanar, multiecho pulse sequences of the brain and surrounding structures were obtained without intravenous contrast. COMPARISON:  Prior CT from earlier the same day. 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 No acute or subacute infarct. Gray-white matter  differentiation maintained. No areas of chronic cortical infarction. No acute or chronic intracranial blood products. No mass lesion, midline shift or mass effect no hydrocephalus or extra-axial fluid collection. Pituitary gland and suprasellar region within normal limits. Vascular: Major intracranial vascular flow voids are maintained. Skull and upper cervical spine: Craniocervical junction normal. Bone marrow signal intensity normal. No scalp soft tissue abnormality. Sinuses/Orbits: Globes orbital soft tissues within normal limits. Paranasal sinuses and mastoid air cells are clear. Other: None. IMPRESSION: 1. No acute intracranial abnormality. 2. 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 Electronically Signed   By: 06/26/2022  Phill Myron M.D.   On: 06/24/2022 23:23      LOS: 1 day    Joycelyn Das, MD [facility] Available via Epic secure chat 7am-7pm After these hours, please refer to coverage provider listed on [email or web address] 06/26/2022, 5:29 PM

## 2022-06-26 NOTE — Plan of Care (Signed)

## 2022-06-26 NOTE — Consult Note (Signed)
Patrick Patel, Patrick Patel [id number] 05/05/1941 Joycelyn Das, MD  Reason for Consult: Evaluate for acute dizziness  HPI: Patient is a 81 year old white male who noted 3 days ago to feel little bit off.  He felt like his eyes were moving a little bit and he was not seen straight.  He went to bed and thought he might be better but actually was worse the next morning.  He did not feel like he could walk and called 911 and came to the hospital.  He has not had any change in his hearing but feels dizzy.  He has not had any falls or head injuries.  He has not had any upper respiratory infections in a long time.  He had a CT scan that showed no intracranial bleed or signs of infection.  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  His dizziness has improved a little bit in the last couple of days such that he is not quite so dizzy and is able to walk a little bit with a walker. He also has been found to have a urinary tract infection is being treated on antibiotics for that.  He was mildly hypokalemic and that is being corrected as well.  Allergies:  Allergies  Allergen Reactions   Bactrim [Sulfamethoxazole-Trimethoprim] Swelling and Rash   Pioglitazone Swelling   Rosiglitazone Other (See Comments)    Ankle swelling    ROS: Review of systems normal other than 12 systems except per HPI.  PMH:  Past Medical History:  Diagnosis Date   Diabetes (HCC)    Hyperlipidemia    Hypertension     FH:  Family History  Problem Relation Age of Onset   Breast cancer Mother    Thyroid cancer Mother     SH:  Social History   Socioeconomic History   Marital status: Single    Spouse name: Not on file   Number of children: Not on file   Years of education: Not on file   Highest education level: Not on file  Occupational History   Not on file  Tobacco Use   Smoking status: Former   Smokeless tobacco: Current    Types: Snuff  Substance  and Sexual Activity   Alcohol use: No    Alcohol/week: 0.0 standard drinks of alcohol   Drug use: No   Sexual activity: Not on file  Other Topics Concern   Not on file  Social History Narrative   Not on file   Social Determinants of Health   Financial Resource Strain: Not on file  Food Insecurity: Not on file  Transportation Needs: Not on file  Physical Activity: Not on file  Stress: Not on file  Social Connections: Not on file  Intimate Partner Violence: Not on file    PSH: History reviewed. No pertinent surgical history.  Physical  Exam: The patient's ear canals are relatively clear.  A little more wax on his right side.  The eardrums do not show any inflammation or signs of fluid in the middle ear.  His eyes show just the slightest bit of movement which may be nystagmus.  His nose is open and clear.  His oropharynx shows no oral lesions and is healthy.    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  The symptoms are improving and likely will continue to improve with time.  He needs a VNG to evaluate his vestibular system to see if there  is a unilateral weakness.  This will also test his ocular mobility to see if there is a problem there which is a more central finding.  He also needs an ophthalmology evaluation and should get a office visit scheduled when he is discharged home, to get a thorough exam done by them.  We will get a VNG/audiogram scheduled to be done after he is discharged and then plan to see him in the office to go over this and see if he needs any further testing or treatment.  His symptoms are lessening such that he probably does not need meclizine as much anymore and can slowly get around with a walker or some assistance.  He will need to be off meclizine for several days prior to getting a balance test done.    Beverly Sessions Daymen Hassebrock 06/26/2022 7:22 PM

## 2022-06-26 NOTE — Progress Notes (Signed)
Physical Therapy Treatment Patient Details Name: Patrick Patel MRN: [id number] DOB: 11-Apr-1941 Today's Date: 06/26/2022   History of Present Illness Pt is a 81 y.o. male with PMH including DM, HTN. Pt presented to ED via EMS with chief concern of dizziness.   PT Comments    Pt agreeable to PT this date, received and left sitting EOB. Pt denies aural fullness, tinnitus,or a recent illness. Pt continues to demonstrate L beating horizontal nystagmus with smooth pursuits and states the room is not spinning, but moving. Pt ambulated >80 feet with RW and CGA with occasional R/L staggering and unsteadiness. BP taken in standing after ambulation 141/60 with HR at 108 bpm. Pt then completed x5 sit-to-stands from EOB with min A to CGA with L/R head turns during each standing phase. Although attempting to reproduce symptoms with this activity for adaptation, pt reported symptoms as 5.5/10 prior to sit-to-stands with head turns, and 3.5/10 after treatment. Unknown etiology of nystagmus and dizziness. Continue to recommend STR at discharge.   Recommendations for follow up therapy are one component of a multi-disciplinary discharge planning process, led by the attending physician.  Recommendations may be updated based on patient status, additional functional criteria and insurance authorization.  Follow Up Recommendations  Skilled nursing-short term rehab (<3 hours/day) Can patient physically be transported by private vehicle: Yes   Assistance Recommended at Discharge Intermittent Supervision/Assistance  Patient can return home with the following A little help with walking and/or transfers;A little help with bathing/dressing/bathroom;Assistance with cooking/housework;Assist for transportation;Help with stairs or ramp for entrance   Equipment Recommendations  Rolling walker (2 wheels)    Recommendations for Other Services       Precautions / Restrictions Precautions Precautions:  Fall Restrictions Weight Bearing Restrictions: No     Mobility  Bed Mobility Overal bed mobility: Modified Independent             General bed mobility comments: increased time/effort    Transfers Overall transfer level: Needs assistance Equipment used: Rolling walker (2 wheels) Transfers: Sit to/from Stand Sit to Stand: Min assist, Min guard           General transfer comment: Min A to stand initially progressing to CGA with pt utilizing momentum with forward trunk lean    Ambulation/Gait Ambulation/Gait assistance: Min guard Gait Distance (Feet):  (>80 feet) Assistive device: Rolling walker (2 wheels) Gait Pattern/deviations: Step-through pattern, Wide base of support, Staggering left, Staggering right Gait velocity: decreased     General Gait Details: Pt ambulates with step-through, continous gait pattern with excessive hip ER with occasional staggering L/R.   Stairs             Wheelchair Mobility    Modified Rankin (Stroke Patients Only)       Balance Overall balance assessment: Needs assistance Sitting-balance support: No upper extremity supported, Feet supported Sitting balance-Leahy Scale: Good Sitting balance - Comments: static sitting balance EOB   Standing balance support: Bilateral upper extremity supported, During functional activity, Single extremity supported Standing balance-Leahy Scale: Fair Standing balance comment: pt able to stand at bedside with 1 UE on bed rail to maintain balance while BP taken; bilat UE support on RW to ambulate                            Cognition Arousal/Alertness: Awake/alert Behavior During Therapy: WFL for tasks assessed/performed Overall Cognitive Status: Within Functional Limits for tasks assessed  General Comments: A&Ox4        Exercises Other Exercises Other Exercises: Smooth pursuits horizontally caused bilat L beating horiztontal  nystagmus. Other Exercises: 5x sit-to-stand with 2x ea R/L head turns during ea standing phase.    General Comments General comments (skin integrity, edema, etc.): BP taken in standing post-ambulation: 141/60 HR 108      Pertinent Vitals/Pain Pain Assessment Pain Assessment: No/denies pain    Home Living Family/patient expects to be discharged to:: Private residence Living Arrangements: Other relatives (sister) Available Help at Discharge: Other (Comment) (no help available at DC) Type of Home: House Home Access: Stairs to enter Entrance Stairs-Rails: None Entrance Stairs-Number of Steps: 1   Home Layout: One level Home Equipment: Cane - single point      Prior Function            PT Goals (current goals can now be found in the care plan section) Acute Rehab PT Goals Patient Stated Goal: to get better PT Goal Formulation: With patient Time For Goal Achievement: 07/09/22 Potential to Achieve Goals: Good Progress towards PT goals: Progressing toward goals    Frequency    Min 2X/week      PT Plan Current plan remains appropriate    Co-evaluation              AM-PAC PT "6 Clicks" Mobility   Outcome Measure  Help needed turning from your back to your side while in a flat bed without using bedrails?: A Little Help needed moving from lying on your back to sitting on the side of a flat bed without using bedrails?: A Little Help needed moving to and from a bed to a chair (including a wheelchair)?: A Little Help needed standing up from a chair using your arms (e.g., wheelchair or bedside chair)?: A Little Help needed to walk in hospital room?: A Little Help needed climbing 3-5 steps with a railing? : A Lot 6 Click Score: 17    End of Session Equipment Utilized During Treatment: Gait belt Activity Tolerance: Patient tolerated treatment well Patient left: in bed;with call bell/phone within reach Nurse Communication: Mobility status PT Visit Diagnosis:  Unsteadiness on feet (R26.81);Other abnormalities of gait and mobility (R26.89);Dizziness and giddiness (R42)     Time: 6144-3154 PT Time Calculation (min) (ACUTE ONLY): 36 min  Charges:                       Bobbye Petti, SPT  Kenard Morawski 06/26/2022, 1:09 PM

## 2022-06-27 ENCOUNTER — Telehealth: Payer: Self-pay | Admitting: Family Medicine

## 2022-06-27 DIAGNOSIS — H8120 Vestibular neuronitis, unspecified ear: Secondary | ICD-10-CM

## 2022-06-27 DIAGNOSIS — H8110 Benign paroxysmal vertigo, unspecified ear: Secondary | ICD-10-CM | POA: Diagnosis not present

## 2022-06-27 DIAGNOSIS — N390 Urinary tract infection, site not specified: Secondary | ICD-10-CM | POA: Diagnosis not present

## 2022-06-27 DIAGNOSIS — R42 Dizziness and giddiness: Secondary | ICD-10-CM | POA: Diagnosis not present

## 2022-06-27 DIAGNOSIS — E785 Hyperlipidemia, unspecified: Secondary | ICD-10-CM | POA: Diagnosis not present

## 2022-06-27 LAB — BASIC METABOLIC PANEL
Anion gap: 10 (ref 5–15)
BUN: 21 mg/dL (ref 8–23)
CO2: 23 mmol/L (ref 22–32)
Calcium: 9 mg/dL (ref 8.9–10.3)
Chloride: 106 mmol/L (ref 98–111)
Creatinine, Ser: 0.91 mg/dL (ref 0.61–1.24)
GFR, Estimated: >60 mL/min (ref >60)
Glucose, Bld: 161 mg/dL — ABNORMAL HIGH (ref 70–99)
Potassium: 3.5 mmol/L (ref 3.5–5.1)
Sodium: 139 mmol/L (ref 135–145)

## 2022-06-27 LAB — GLUCOSE, CAPILLARY
Glucose-Capillary: 248 mg/dL — ABNORMAL HIGH (ref 70–99)
Glucose-Capillary: 247 mg/dL — ABNORMAL HIGH (ref 70–99)
Glucose-Capillary: 237 mg/dL — ABNORMAL HIGH (ref 70–99)
Glucose-Capillary: 277 mg/dL — ABNORMAL HIGH (ref 70–99)

## 2022-06-27 MED ORDER — CEFTRIAXONE SODIUM 1 G IJ SOLR
1.0000 g | INTRAMUSCULAR | Status: AC
  Administered 2022-06-28 – 2022-06-29 (×2): 1 g via INTRAMUSCULAR
  Filled 2022-06-27 (×2): qty 10

## 2022-06-27 MED ORDER — POTASSIUM CHLORIDE CRYS ER 20 MEQ PO TBCR
40.0000 meq | EXTENDED_RELEASE_TABLET | Freq: Once | ORAL | Status: AC
  Administered 2022-06-27: 40 meq via ORAL
  Filled 2022-06-27: qty 2

## 2022-06-27 MED ORDER — LOPERAMIDE HCL 2 MG PO CAPS
4.0000 mg | ORAL_CAPSULE | Freq: Once | ORAL | Status: AC
  Administered 2022-06-27: 4 mg via ORAL
  Filled 2022-06-27: qty 2

## 2022-06-27 NOTE — Plan of Care (Signed)

## 2022-06-27 NOTE — Telephone Encounter (Signed)
Pt called in stating that he just wanted Dr. Birdie Sons to know that he been in the hospital since Saturday... Pt stated if Dr. Birdie Sons needs to call him to call this number [phone].Patrick KitchenMarland Patel

## 2022-06-27 NOTE — TOC Initial Note (Addendum)
Transition of Care [facility]) - Initial/Assessment Note    Patient Details  Name: Patrick Patel MRN: [id number] Date of Birth: 04-Feb-1941  [facility]) CM/SW Contact:    Maree Krabbe, LCSW Phone Number: 06/27/2022, 2:10 PM  Clinical Narrative:    CSW spoke with pt regarding dc plan. Pt would not like to go to SNF would rather get therapy here until dc and then get outpatient. Pt states he has already improved so much he doesn't think he will need SNF at d/c.     Pt is agreeable to outpatient. Pt states he has a cane at home and suspects he will be good enough to use that when he dc home.           Expected Discharge Plan: Home/Self Care Barriers to Discharge: Continued Medical Work up   Patient Goals and CMS Choice Patient states their goals for this hospitalization and ongoing recovery are:: to go home      Expected Discharge Plan and Services Expected Discharge Plan: Home/Self Care In-house Referral: Clinical Social Work   Post Acute Care Choice: NA Living arrangements for the past 2 months: Single Family Home                                      Prior Living Arrangements/Services Living arrangements for the past 2 months: Single Family Home Lives with:: Self Patient language and need for interpreter reviewed:: Yes Do you feel safe going back to the place where you live?: Yes      Need for Family Participation in Patient Care: Yes (Comment) Care giver support system in place?: Yes (comment)   Criminal Activity/Legal Involvement Pertinent to Current Situation/Hospitalization: No - Comment as needed  Activities of Daily Living Home Assistive Devices/Equipment: Cane (specify quad or straight) ADL Screening (condition at time of admission) Patient's cognitive ability adequate to safely complete daily activities?: Yes Is the patient deaf or have difficulty hearing?: No Does the patient have difficulty seeing, even when wearing glasses/contacts?: No Does  the patient have difficulty concentrating, remembering, or making decisions?: No Patient able to express need for assistance with ADLs?: Yes Does the patient have difficulty dressing or bathing?: Yes Independently performs ADLs?: No Communication: Independent Dressing (OT): Needs assistance Is this a change from baseline?: Change from baseline, expected to last <3days Grooming: Independent Feeding: Independent Bathing: Needs assistance Is this a change from baseline?: Change from baseline, expected to last <3 days Toileting: Independent In/Out Bed: Needs assistance Is this a change from baseline?: Change from baseline, expected to last <3 days Walks in Home: Needs assistance Is this a change from baseline?: Change from baseline, expected to last <3 days Does the patient have difficulty walking or climbing stairs?: Yes Weakness of Legs: Right Weakness of Arms/Hands: None  Permission Sought/Granted Permission sought to share information with : Family Supports    Share Information with NAME: minnie     Permission granted to share info w Relationship: sister     Emotional Assessment Appearance:: Appears stated age Attitude/Demeanor/Rapport: Engaged Affect (typically observed): Accepting Orientation: : Oriented to Self, Oriented to Place, Oriented to  Time, Oriented to Situation Alcohol / Substance Use: Not Applicable Psych Involvement: No (comment)  Admission diagnosis:  Dizziness [R42] Patient Active Problem List   Diagnosis Date Noted   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   Dizziness 06/25/2022   Type 2 diabetes mellitus without complications (HCC)  06/25/2022   Dyslipidemia 06/25/2022   Acute lower UTI 06/25/2022   Neck nodule 05/10/2022   Leg weakness, bilateral 08/03/2021   Bilateral leg pain 04/22/2021   Chronic fatigue 08/31/2020   Low back pain 05/26/2020   Abdominal pain 05/26/2020   Abrasion of toe of right foot 05/26/2020   Onychomycosis 05/26/2020   Pedal edema  05/26/2020   De Quervain's tenosynovitis, left 05/26/2020   Trigger finger 02/23/2020   GERD (gastroesophageal reflux disease) 11/20/2019   Hyperpigmented skin lesion 02/06/2019   Hematuria 02/04/2019   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   Contusion of toenail of right foot 10/25/2018   Decreased pedal pulses 03/19/2018   Lump in throat 12/17/2017   Diarrhea 02/20/2017   Dysuria 02/16/2017   Rotator cuff impingement syndrome 11/17/2016   Foreskin problem 11/17/2016   Essential hypertension 11/17/2016   CAD (coronary artery disease) 05/04/2016   Type 2 diabetes mellitus with diabetic neuropathy, unspecified (HCC) 04/13/2016   Hyperlipidemia 04/13/2016   Chest pain 03/12/2016   BPH (benign prostatic hyperplasia) 03/12/2016   Venous stasis of both lower extremities 03/12/2016   Osteoarthritis 03/09/2015   PCP:  Glori Luis, MD Pharmacy:   [facility]) - [place], [place] - [address] [facility] [address] [place] [address] [place] [address] Phone: [phone] Fax: [phone]  [facility] [address] [place] - [address] [place] [address] Phone: [phone] Fax: [phone]  [facility] ([facility] ) - [place], [place] - [address] [place] [address] Phone: [phone] Fax: [phone]     Social Determinants of Health (SDOH) Interventions    Readmission Risk Interventions     No data to display

## 2022-06-27 NOTE — Progress Notes (Signed)
PROGRESS NOTE    Patrick Patel  RSW:[id number] DOB: 23-Sep-1941 DOA: 06/24/2022 PCP: Glori Luis, MD    Brief Narrative:   Patrick Patel is a 81 y.o. Caucasian male with medical history significant for type 2 diabetes mellitus, hypertension and dyslipidemia, presented to the emergency room with acute onset of dizziness with ataxia.  He was seen for right lower extremity pain at his PCPs doctor and was prescribed meloxicam.  After meloxicam, he indicated these symptoms.  Of note patient was taking significant amount of aspirin at home and was told not to take by his primary care provider.  In the ED, BP was 153/67 with otherwise normal vital signs.  Labs revealed a CMP remarkable for mild hypokalemia of 3.4 and hyperglycemia of 205 with a BUN of 24.  High sensitive troponin I was 9 and later 10. CBC was within normal.  UA showed more than 500 glucose, 5 ketones and trace leukocytes with 6-10 WBCs and many bacteria.  EKG showed atrial fibrillation with controlled ventricular response.  CTA of the head and neck showed negative findings for large vessel occlusion.  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 The patient was given 25 mg of p.o. meclizine and 50 mg of p.o. tramadol and was admitted to hospital for further evaluation and treatment.  During hospitalization, patient continued to have some dizziness so meclizine was continued.  ENT was consulted with an impression of vestibular neuronitis.  Patient has been recommended outpatient ENT follow-up after discharge.  Physical therapy has seen the patient in recommend skilled nursing facility at this time.    Assessment and Plan:  Principal Problem:   Vestibular neuronitis Active Problems:   Dizziness   Acute lower UTI   Type 2 diabetes mellitus without complications (HCC)   Dyslipidemia   Dizziness associated with ataxia.   Patient had horizontal nystagmus, gait dysfunction dizziness with concern for vestibular neuronitis.[name]   Physical therapy on board recommended skilled nursing facility at this time.  MRI of the brain without any acute stroke continue meclizine. .  TSH of 3.2.  LFTs within normal limits.  WBC without leukocytosis.  ENT has seen the patient at this time.  Continue meclizine.  Will need outpatient ENT follow-up after discharge.  Salicylate level was less than 7.  Patient feels much better today in terms of his dizziness.   Mild hypokalemia.  Improved after replacement.  Latest potassium of 3.5.  We will continue to replace.   Acute lower UTI with hematuria Did have symptomatic UTI.  Dysuria and burning sensation..  Received IV Rocephin, urine culture with insignificant growth patient does not wish IV but okay with IM Rocephin.  Will change.  He strongly wishes parenteral injection.  Continue Pyridium   Type 2 diabetes mellitus with hyperglycemia (HCC) Continue basal and sliding scale insulin, carb controlled diet..  Hold off with metformin and Jardiance for now.  Latest POC glucose of 213.   Dyslipidemia Continue statin therapy and fish oil.   Deconditioning, debility, generalized weakness.  Physical therapy has recommended skilled nursing facility placement .  Patient wishes to go to rehabilitation.  TOC has been consulted.   DVT prophylaxis:   Lovenox subcu   Code Status:     Code Status: Full Code  Disposition: Skilled nursing facility as per PT recommendation.    Status is: Inpatient  Remains inpatient appropriate because: Need for rehabilitation vestibular neuritis   Family Communication:  I again tried to reach the patient's sister on the  home and cell phone but was unable to reach her.   Consultants:  ENT  Procedures:  None  Antimicrobials:  Rocephin IV 06/25/2022>  Anti-infectives (From admission, onward)    Start     Dose/Rate Route Frequency Ordered Stop   06/27/22 1345  cefTRIAXone (ROCEPHIN) injection 1 g        1 g Intramuscular Every 24 hours 06/27/22 1257      06/25/22 0400  cefTRIAXone (ROCEPHIN) 1 g in sodium chloride 0.9 % 100 mL IVPB  Status:  Discontinued        1 g 200 mL/hr over 30 Minutes Intravenous Every 24 hours 06/25/22 0325 06/27/22 1257       Subjective: Today, patient was seen and examined at bedside.  Patient states that he has had dizziness and visual disturbance that has improved.  Does not wish IV Rocephin but wants IM Rocephin and states that his UTI has not subsided yet.  Complains of sandpaper like feeling when he urinates and has some hematuria.   Objective: Vitals:   06/26/22 2057 06/27/22 0419 06/27/22 0936 06/27/22 1015  BP: (!) 142/72 132/70 (!) 123/56   Pulse: 96 85 95   Resp: 18 18 18    Temp: 98.6 F (37 C) 98.2 F (36.8 C) 98.2 F (36.8 C)   TempSrc: Oral Oral Oral   SpO2: 97% 95% 93% 93%  Weight:      Height:        Intake/Output Summary (Last 24 hours) at 06/27/2022 1258 Last data filed at 06/27/2022 1144 Gross per 24 hour  Intake 1477.81 ml  Output 1700 ml  Net -222.19 ml   Filed Weights   06/24/22 1104  Weight: 90.7 kg    Physical Examination: Body mass index is 30.41 kg/m.   General: Obese built, not in obvious distress HENT:   No scleral pallor or icterus noted. Oral mucosa is moist.  Horizontal nystagmus noted less than yesterday. Chest:  Clear breath sounds.  Diminished breath sounds bilaterally. No crackles or wheezes.  CVS: S1 &S2 heard. No murmur.  Regular rate and rhythm. Abdomen: Soft, nontender, nondistended.  Bowel sounds are heard.   Extremities: No cyanosis, clubbing or edema.  Peripheral pulses are palpable. Psych: Alert, awake and oriented, normal mood CNS:  No cranial nerve deficits.  Power equal in all extremities.  Did not test  gait. Skin: Warm and dry.  No rashes noted.   Data Reviewed:   CBC: Recent Labs  Lab 06/24/22 1117 06/25/22 0120 06/25/22 0350 06/26/22 0342  WBC 10.1 10.1 9.8  --   NEUTROABS 7.1  --   --   --   HGB 14.9 15.3 15.0 14.8  HCT 44.7  45.1 45.0 43.8  MCV 88.7 88.3 89.6  --   PLT 215 228 233  --     Basic Metabolic Panel: Recent Labs  Lab 06/24/22 1117 06/25/22 0120 06/25/22 0350 06/26/22 0342 06/27/22 0356  NA 139  --  139 138 139  K 3.4*  --  3.4* 3.8 3.5  CL 104  --  105 105 106  CO2 25  --  24 23 23   GLUCOSE 205*  --  148* 149* 161*  BUN 24*  --  24* 20 21  CREATININE 0.81 0.89 0.91 0.87 0.91  CALCIUM 8.9  --  9.2 8.9 9.0  MG 2.3  --   --   --   --     Liver Function Tests: Recent Labs  Lab 06/24/22 1117  AST 19  ALT 15  ALKPHOS 71  BILITOT 0.9  PROT 7.2  ALBUMIN 3.6     Radiology Studies: No results found.    LOS: 2 days    Joycelyn Das, MD [facility] Available via Epic secure chat 7am-7pm After these hours, please refer to coverage provider listed on [email or web address] 06/27/2022, 12:58 PM

## 2022-06-27 NOTE — Telephone Encounter (Signed)
Noted. I can see him after discharge for hospital follow-up.

## 2022-06-28 ENCOUNTER — Inpatient Hospital Stay: Payer: Medicare Other

## 2022-06-28 DIAGNOSIS — M79605 Pain in left leg: Secondary | ICD-10-CM

## 2022-06-28 DIAGNOSIS — H8120 Vestibular neuronitis, unspecified ear: Secondary | ICD-10-CM | POA: Diagnosis not present

## 2022-06-28 DIAGNOSIS — M79604 Pain in right leg: Secondary | ICD-10-CM

## 2022-06-28 DIAGNOSIS — E785 Hyperlipidemia, unspecified: Secondary | ICD-10-CM | POA: Diagnosis not present

## 2022-06-28 DIAGNOSIS — N390 Urinary tract infection, site not specified: Secondary | ICD-10-CM | POA: Diagnosis not present

## 2022-06-28 DIAGNOSIS — R42 Dizziness and giddiness: Secondary | ICD-10-CM | POA: Diagnosis not present

## 2022-06-28 LAB — CBC
HCT: 42.7 % (ref 39.0–52.0)
Hemoglobin: 14.1 g/dL (ref 13.0–17.0)
MCH: 29.3 pg (ref 26.0–34.0)
MCHC: 33 g/dL (ref 30.0–36.0)
MCV: 88.6 fL (ref 80.0–100.0)
Platelets: 182 10*3/uL (ref 150–400)
RBC: 4.82 MIL/uL (ref 4.22–5.81)
RDW: 14 % (ref 11.5–15.5)
WBC: 8.7 10*3/uL (ref 4.0–10.5)
nRBC: 0 % (ref 0.0–0.2)

## 2022-06-28 LAB — BASIC METABOLIC PANEL
Anion gap: 9 (ref 5–15)
BUN: 27 mg/dL — ABNORMAL HIGH (ref 8–23)
CO2: 27 mmol/L (ref 22–32)
Calcium: 8.9 mg/dL (ref 8.9–10.3)
Chloride: 101 mmol/L (ref 98–111)
Creatinine, Ser: 1.04 mg/dL (ref 0.61–1.24)
GFR, Estimated: >60 mL/min (ref >60)
Glucose, Bld: 261 mg/dL — ABNORMAL HIGH (ref 70–99)
Potassium: 3.9 mmol/L (ref 3.5–5.1)
Sodium: 137 mmol/L (ref 135–145)

## 2022-06-28 LAB — MAGNESIUM: Magnesium: 2.2 mg/dL (ref 1.7–2.4)

## 2022-06-28 LAB — GLUCOSE, CAPILLARY
Glucose-Capillary: 253 mg/dL — ABNORMAL HIGH (ref 70–99)
Glucose-Capillary: 357 mg/dL — ABNORMAL HIGH (ref 70–99)
Glucose-Capillary: 301 mg/dL — ABNORMAL HIGH (ref 70–99)
Glucose-Capillary: 240 mg/dL — ABNORMAL HIGH (ref 70–99)

## 2022-06-28 MED ORDER — STERILE WATER FOR INJECTION IJ SOLN
INTRAMUSCULAR | Status: AC
  Administered 2022-06-28: 2.1 mL
  Filled 2022-06-28: qty 10

## 2022-06-28 MED ORDER — LOPERAMIDE HCL 2 MG PO CAPS
2.0000 mg | ORAL_CAPSULE | ORAL | Status: DC | PRN
Start: 2022-06-28 — End: 2022-06-29
  Administered 2022-06-28 – 2022-06-29 (×2): 2 mg via ORAL
  Filled 2022-06-28 (×2): qty 1

## 2022-06-28 MED ORDER — INSULIN DETEMIR 100 UNIT/ML ~~LOC~~ SOLN
10.0000 [IU] | Freq: Every day | SUBCUTANEOUS | Status: DC
  Administered 2022-06-28: 10 [IU] via SUBCUTANEOUS
  Filled 2022-06-28: qty 0.1

## 2022-06-28 MED ORDER — INSULIN DETEMIR 100 UNIT/ML ~~LOC~~ SOLN
10.0000 [IU] | Freq: Two times a day (BID) | SUBCUTANEOUS | Status: DC
  Administered 2022-06-28 – 2022-06-29 (×2): 10 [IU] via SUBCUTANEOUS
  Filled 2022-06-28 (×2): qty 0.1

## 2022-06-28 MED ORDER — INSULIN ASPART 100 UNIT/ML IJ SOLN
0.0000 [IU] | Freq: Three times a day (TID) | INTRAMUSCULAR | Status: DC
  Administered 2022-06-28: 15 [IU] via SUBCUTANEOUS
  Administered 2022-06-28: 11 [IU] via SUBCUTANEOUS
  Administered 2022-06-29: 15 [IU] via SUBCUTANEOUS
  Administered 2022-06-29: 5 [IU] via SUBCUTANEOUS
  Filled 2022-06-28 (×4): qty 1

## 2022-06-28 NOTE — Telephone Encounter (Signed)
Patient called back today requesting a recommendation for rehab once he is discharged.

## 2022-06-28 NOTE — Progress Notes (Signed)
Physical Therapy Treatment Patient Details Name: Patrick Patel MRN: [id number] DOB: 10-Sep-1941 Today's Date: 06/28/2022   History of Present Illness Pt is a 81 y.o. male with PMH including DM, HTN. Pt presented to ED via EMS with chief concern of dizziness.    PT Comments    Pt is making progress with mobility reporting no dizziness with ambulation, dynamic standing with RW, or with transfers.  Pt progressed walking distance to 160' at min guard level.  Educated pt on walker recommendation for safety.  Pt performs functional mobility at overall min guard to supervision level. Current recommendation for d/c is still appropriate.   Recommendations for follow up therapy are one component of a multi-disciplinary discharge planning process, led by the attending physician.  Recommendations may be updated based on patient status, additional functional criteria and insurance authorization.  Follow Up Recommendations  Skilled nursing-short term rehab (<3 hours/day) Can patient physically be transported by private vehicle: Yes   Assistance Recommended at Discharge Intermittent Supervision/Assistance  Patient can return home with the following A little help with walking and/or transfers;A little help with bathing/dressing/bathroom;Assistance with cooking/housework;Assist for transportation;Help with stairs or ramp for entrance   Equipment Recommendations  Rolling walker (2 wheels)    Recommendations for Other Services       Precautions / Restrictions Precautions Precautions: Fall Restrictions Weight Bearing Restrictions: No     Mobility  Bed Mobility Overal bed mobility: Modified Independent             General bed mobility comments: increased time/effort    Transfers Overall transfer level: Needs assistance Equipment used: Rolling walker (2 wheels) Transfers: Sit to/from Stand, Bed to chair/wheelchair/BSC Sit to Stand: Min assist   Step pivot transfers: Min guard        General transfer comment: min guard to stand from lowered surface cues for body momentum to come up into standing    Ambulation/Gait Ambulation/Gait assistance: Min guard   Assistive device: Rolling walker (2 wheels) Gait Pattern/deviations: Step-through pattern, Wide base of support Gait velocity: decreased     General Gait Details: Pt ambulates with step-through, continous gait pattern pt had no staggering this PT session and no reports of dizziness.   Stairs             Wheelchair Mobility    Modified Rankin (Stroke Patients Only)       Balance Overall balance assessment: Needs assistance Sitting-balance support: No upper extremity supported, Feet supported Sitting balance-Leahy Scale: Good Sitting balance - Comments: static sitting balance EOB   Standing balance support: Bilateral upper extremity supported, During functional activity, Single extremity supported Standing balance-Leahy Scale: Fair Standing balance comment: Pt performed dynamic stepping with RW at supervision level with 2 UE support.                            Cognition Arousal/Alertness: Awake/alert Behavior During Therapy: WFL for tasks assessed/performed Overall Cognitive Status: Within Functional Limits for tasks assessed                                 General Comments: Pt does demonstrate memory issues with conflicting info from RN.        Exercises      General Comments        Pertinent Vitals/Pain Pain Assessment Pain Assessment: No/denies pain    Home Living  Prior Function            PT Goals (current goals can now be found in the care plan section) Acute Rehab PT Goals Patient Stated Goal: to get better PT Goal Formulation: With patient Time For Goal Achievement: 07/09/22 Potential to Achieve Goals: Good Progress towards PT goals: Progressing toward goals    Frequency    Min 2X/week      PT  Plan Current plan remains appropriate    Co-evaluation              AM-PAC PT "6 Clicks" Mobility   Outcome Measure  Help needed turning from your back to your side while in a flat bed without using bedrails?: A Little Help needed moving from lying on your back to sitting on the side of a flat bed without using bedrails?: A Little Help needed moving to and from a bed to a chair (including a wheelchair)?: A Little Help needed standing up from a chair using your arms (e.g., wheelchair or bedside chair)?: A Little Help needed to walk in hospital room?: A Little Help needed climbing 3-5 steps with a railing? : A Lot 6 Click Score: 17    End of Session Equipment Utilized During Treatment: Gait belt Activity Tolerance: Patient tolerated treatment well Patient left: in bed;with call bell/phone within reach Nurse Communication: Mobility status PT Visit Diagnosis: Unsteadiness on feet (R26.81);Other abnormalities of gait and mobility (R26.89);Dizziness and giddiness (R42)     Time: 5631-4970 PT Time Calculation (min) (ACUTE ONLY): 32 min  Charges:  $Gait Training: 8-22 mins $Therapeutic Activity: 8-22 mins                    Hortencia Conradi, PTA  06/28/22, 12:55 PM

## 2022-06-28 NOTE — Progress Notes (Addendum)
Inpatient Diabetes Program Recommendations  AACE/ADA: New Consensus Statement on Inpatient Glycemic Control (2015)  Target Ranges:  Prepandial:   less than 140 mg/dL      Peak postprandial:   less than 180 mg/dL (1-2 hours)      Critically ill patients:  140 - 180 mg/dL   Lab Results  Component Value Date   GLUCAP 253 (H) 06/28/2022   HGBA1C 7.1 (H) 05/19/2022    Review of Glycemic Control  Latest Reference Range & Units 06/26/22 20:20 06/27/22 08:47 06/27/22 12:17 06/27/22 16:27 06/27/22 20:00 06/28/22 07:37  Glucose-Capillary 70 - 99 mg/dL 681 (H) 594 (H) 707 (H) 237 (H) 277 (H) 253 (H)   Diabetes history: DM 2 Outpatient Diabetes medications:  Trulicity 0.75 mg weekly, Glucotrol 20 mg daily, NPH 20 units in the AM/ NPH 15 units q PM, Jardiance 25 mg daily Current orders for Inpatient glycemic control:  Novolog 0-15 units tid with meals Inpatient Diabetes Program Recommendations:   Please add Levemir 10 units bid while in the hospital.   Thanks,  Beryl Meager, RN, BC-ADM Inpatient Diabetes Coordinator Pager [phone]  (8a-5p)

## 2022-06-28 NOTE — Telephone Encounter (Signed)
Is he requesting a referral for PT? Or is this for a SNF? It looks like he declined discharge to a SNF and may just need PT at discharge based on review of his notes.

## 2022-06-28 NOTE — Care Management Important Message (Signed)
Important Message  Patient Details  Name: Patrick Patel MRN: [id number] Date of Birth: Aug 03, 1941   Medicare Important Message Given:  N/A - LOS <3 / Initial given by admissions     [name] [name] 06/28/2022, 9:38 AM

## 2022-06-28 NOTE — Progress Notes (Signed)
PROGRESS NOTE    Patrick Patel  LTJ:[id number] DOB: 03-May-1941 DOA: 06/24/2022 PCP: Glori Luis, MD   Chief Complaint  Patient presents with   Dizziness    Brief Narrative:  Patrick Patel is a 81 y.o. Caucasian male with medical history significant for type 2 diabetes mellitus, hypertension and dyslipidemia, presented to the emergency room with acute onset of dizziness with ataxia.  He was seen for right lower extremity pain at his PCPs doctor and was prescribed meloxicam.  After meloxicam, he indicated these symptoms.  Of note patient was taking significant amount of aspirin at home and was told not to take by his primary care provider.  In the ED, BP was 153/67 with otherwise normal vital signs.  Labs revealed a CMP remarkable for mild hypokalemia of 3.4 and hyperglycemia of 205 with a BUN of 24.  High sensitive troponin I was 9 and later 10. CBC was within normal.  UA showed more than 500 glucose, 5 ketones and trace leukocytes with 6-10 WBCs and many bacteria.  EKG showed atrial fibrillation with controlled ventricular response.  CTA of the head and neck showed negative findings for large vessel occlusion.  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 The patient was given 25 mg of p.o. meclizine and 50 mg of p.o. tramadol and was admitted to hospital for further evaluation and treatment.   During hospitalization, patient continued to have some dizziness so meclizine was continued.  ENT was consulted with an impression of vestibular neuronitis.  Patient has been recommended outpatient ENT follow-up after discharge.  Physical therapy has seen the patient in recommend skilled nursing facility at this time.   Assessment & Plan:   Principal Problem:   Vestibular neuronitis Active Problems:   Dizziness   Acute lower UTI   Type 2 diabetes mellitus without complications (HCC)   Dyslipidemia   Lower extremity pain, bilateral  #1 dizziness/ataxia -Patient presented with  dizziness, ataxia, horizontal nystagmus, gait dysfunction concerning for vestibular neuronitis. -MRI brain done with no acute abnormalities/or acute stroke noted. -TSH within normal limits. -LFTs within normal limits. -CT angiogram head and neck done with no acute significant abnormalities. -Patient assessed by ENT who feel patient likely has a vestibular neuronitis. -Patient with significant clinical improvement. -Patient refusing SNF at this time, refusing home health therapies as he states there is no way to do therapy in his home in his current living situation and would prefer outpatient therapies. -Continue meclizine as needed. -Outpatient PT/OT.  2.  Bilateral lower extremity pain -Patient with complaints of bilateral knee and lower extremity pain ongoing for several months and requesting CT scan of both lower extremities. -Check plain films of bilateral knees. -Check lower extremity Dopplers to rule out DVT. -Supportive care.  3.  Hypokalemia -Repleted.  4.  Acute UTI with hematuria -Patient with symptomatic UTI, dysuria and burning sensation. -Urine cultures done with insignificant growth. -Currently on IV Rocephin and Pyridium. -We will treat empirically for 5 days, discontinue Pyridium after day 3. -Supportive care.  5.  Diabetes mellitus type 2 with hyperglycemia -Hemoglobin A1c 7.1 (08/30/2022) -CBGs noted to be trending up. -Placed on Levemir 10 units twice daily. -Change sliding scale insulin to moderate scale. -Hold home regimen oral hypoglycemia agents hold Glucotrol, Trulicity, NPH, Jardiance. -Diabetes coordinator following.  6.  Hyperlipidemia -Continue statin.  7.  Deconditioning/debility/generalized weakness -Patient assessed by PT and refusing SNF placement and repeat wishes to go to outpatient PT. -Supportive care.     DVT prophylaxis:  Lovenox Code Status: Full Family Communication: updated patient. No family at bedside Disposition: Home  hopefully in 24 hours.   Status is: Inpatient Remains inpatient appropriate because: Severity pf illness   Consultants:  ENT: Dr. Elenore Rota 06/26/2022 Neurology: Dr. Otelia Limes 06/24/2022  Procedures:  CT angiogram head and neck 06/24/2022 MRI brain 06/24/2022   Antimicrobials:  IV Rocephin 06/25/2022>>>> 06/27/2022   Subjective: Sitting up at the side of the bed getting ready to eat his lunch.  No chest pain.  No shortness of breath.  Patient states dizziness and horizontal nystagmus resolved.  Complaining of bilateral knee and lower extremity pain requesting CT scan to be done of his lower extremities as well as Dopplers.  States pain has been ongoing for several months.  Does not want to go to SNF.  Refusing home health and wanting outpatient PT as he states due to current situation at home unable to have home health physical therapy do therapy anywhere.  Would prefer outpatient PT.  Objective: Vitals:   06/28/22 0341 06/28/22 0734 06/28/22 1111 06/28/22 1619  BP: 139/67 (!) 128/58 138/62 116/73  Pulse: 83 67 84 84  Resp:  17 16 19   Temp: 98.7 F (37.1 C) 98 F (36.7 C) 98.1 F (36.7 C) 98.7 F (37.1 C)  TempSrc:      SpO2: 96% 96% 95% 96%  Weight:      Height:        Intake/Output Summary (Last 24 hours) at 06/28/2022 1910 Last data filed at 06/28/2022 1907 Gross per 24 hour  Intake 960 ml  Output 400 ml  Net 560 ml   Filed Weights   06/24/22 1104  Weight: 90.7 kg    Examination:  General exam: Appears calm and comfortable  Respiratory system: Clear to auscultation.  No wheezes, no crackles, no rhonchi.  Fair air movement.  Respiratory effort normal. Cardiovascular system: S1 & S2 heard, RRR. No JVD, murmurs, rubs, gallops or clicks. No pedal edema. Gastrointestinal system: Abdomen is nondistended, soft and nontender. No organomegaly or masses felt. Normal bowel sounds heard. Central nervous system: Alert and oriented. No focal neurological deficits. Extremities:  Symmetric 5 x 5 power. Skin: No rashes, lesions or ulcers Psychiatry: Judgement and insight appear normal. Mood & affect appropriate.     Data Reviewed: I have personally reviewed following labs and imaging studies  CBC: Recent Labs  Lab 06/24/22 1117 06/25/22 0120 06/25/22 0350 06/26/22 0342 06/28/22 0514  WBC 10.1 10.1 9.8  --  8.7  NEUTROABS 7.1  --   --   --   --   HGB 14.9 15.3 15.0 14.8 14.1  HCT 44.7 45.1 45.0 43.8 42.7  MCV 88.7 88.3 89.6  --  88.6  PLT 215 228 233  --  182    Basic Metabolic Panel: Recent Labs  Lab 06/24/22 1117 06/25/22 0120 06/25/22 0350 06/26/22 0342 06/27/22 0356 06/28/22 0514  NA 139  --  139 138 139 137  K 3.4*  --  3.4* 3.8 3.5 3.9  CL 104  --  105 105 106 101  CO2 25  --  24 23 23 27   GLUCOSE 205*  --  148* 149* 161* 261*  BUN 24*  --  24* 20 21 27*  CREATININE 0.81 0.89 0.91 0.87 0.91 1.04  CALCIUM 8.9  --  9.2 8.9 9.0 8.9  MG 2.3  --   --   --   --  2.2    GFR: Estimated Creatinine Clearance: 63 mL/min (by  C-G formula based on SCr of 1.04 mg/dL).  Liver Function Tests: Recent Labs  Lab 06/24/22 1117  AST 19  ALT 15  ALKPHOS 71  BILITOT 0.9  PROT 7.2  ALBUMIN 3.6    CBG: Recent Labs  Lab 06/27/22 1627 06/27/22 2000 06/28/22 0737 06/28/22 1140 06/28/22 1633  GLUCAP 237* 277* 253* 357* 301*     Recent Results (from the past 240 hour(s))  Urine Culture     Status: Abnormal   Collection Time: 06/24/22 11:17 AM   Specimen: Urine, Clean Catch  Result Value Ref Range Status   Specimen Description   Final    URINE, CLEAN CATCH Performed at [facility], [address] [place], [place] [address]    Special Requests   Final    NONE Performed at [facility], [address] [place], [place] [address]    Culture (A)  Final    <10,000 COLONIES/mL INSIGNIFICANT GROWTH Performed at [facility], [address], [place], [place] [address]    Report Status 06/26/2022 FINAL  Final   SARS Coronavirus 2 by RT PCR (hospital order, performed in [facility] hospital lab) *cepheid single result test* Anterior Nasal Swab     Status: None   Collection Time: 06/25/22 12:26 AM   Specimen: Anterior Nasal Swab  Result Value Ref Range Status   SARS Coronavirus 2 by RT PCR NEGATIVE NEGATIVE Final    Comment: (NOTE) SARS-CoV-2 target nucleic acids are NOT DETECTED.  The SARS-CoV-2 RNA is generally detectable in upper and lower respiratory specimens during the acute phase of infection. The lowest concentration of SARS-CoV-2 viral copies this assay can detect is 250 copies / mL. A negative result does not preclude SARS-CoV-2 infection and should not be used as the sole basis for treatment or other patient management decisions.  A negative result may occur with improper specimen collection / handling, submission of specimen other than nasopharyngeal swab, presence of viral mutation(s) within the areas targeted by this assay, and inadequate number of viral copies (<250 copies / mL). A negative result must be combined with clinical observations, patient history, and epidemiological information.  Fact Sheet for Patients:   RoadLapTop.co.za  Fact Sheet for Healthcare Providers: http://kim-miller.com/  This test is not yet approved or  cleared by the [place] FDA and has been authorized for detection and/or diagnosis of SARS-CoV-2 by FDA under an Emergency Use Authorization (EUA).  This EUA will remain in effect (meaning this test can be used) for the duration of the COVID-19 declaration under Section 564(b)(1) of the Act, 21 U.S.C. section 360bbb-3(b)(1), unless the authorization is terminated or revoked sooner.  Performed at [facility], [address] [place], [place] [address]          Radiology Studies: No results found.      Scheduled Meds:  alfuzosin  10 mg Oral Q breakfast   calcium carbonate  1  tablet Oral Q breakfast   And   magnesium oxide  400 mg Oral Q breakfast   And   zinc sulfate  220 mg Oral Q breakfast   cefTRIAXone (ROCEPHIN) IM  1 g Intramuscular Q24H   chlorthalidone  50 mg Oral Daily   enoxaparin (LOVENOX) injection  0.5 mg/kg Subcutaneous Q24H   famotidine  20 mg Oral QHS   insulin aspart  0-15 Units Subcutaneous TID WC   insulin detemir  10 Units Subcutaneous BID   multivitamin with minerals  2 tablet Oral Daily  omega-3 acid ethyl esters  1 g Oral Daily   rosuvastatin  20 mg Oral Daily   Vitamin D (Ergocalciferol)  50,000 Units Oral Q7 days   Continuous Infusions:   LOS: 3 days    Time spent: 40 minutes    Patrick Harvest, MD [facility]   To contact the attending provider between 7A-7P or the covering provider during after hours 7P-7A, please log into the web site [email or web address] and access using universal [place] password for that web site. If you do not have the password, please call the hospital operator.  06/28/2022, 7:10 PM

## 2022-06-28 NOTE — Telephone Encounter (Signed)
Noted. We can refer him once he follows up after discharge so that we know exactly who to refer him to.

## 2022-06-28 NOTE — Progress Notes (Signed)
Occupational Therapy Treatment Patient Details Name: Patrick Patel MRN: [id number] DOB: 08-Sep-1941 Today's Date: 06/28/2022   History of present illness Pt is a 81 y.o. male with PMH including DM, HTN. Pt presented to ED via EMS with chief concern of dizziness.   OT comments  Pt seen for OT treatment on this date. Upon arrival to room pt awake and alert, seated EOB. Pt agreeable to tx focused on grooming tasks and functional mobility. Pt required CGA for STS - pt demonstrated good recall of safe hand placement. CGA to perform grooming tasks at the sink with intermittent single UE support for balance. MIN A for balance during simulated toilet t/f and ambulation in hallway with SPC ~158ft - several observed LOB, but able to correct. Pt verbalized ambulation did not feel at baseline, encouraged pt to use RW for safety. Pt making good progress toward goals. Pt continues to benefit from skilled OT services to maximize return to PLOF and minimize risk of future falls, injury. Will continue to follow POC. Discharge recommendation updated to [facility] with initial 24/7 assistance - if pt does not have assistance, recommend SNF.     Recommendations for follow up therapy are one component of a multi-disciplinary discharge planning process, led by the attending physician.  Recommendations may be updated based on patient status, additional functional criteria and insurance authorization.    Follow Up Recommendations  Home health OT    Assistance Recommended at Discharge Intermittent Supervision/Assistance  Patient can return home with the following  A little help with walking and/or transfers;A little help with bathing/dressing/bathroom;Help with stairs or ramp for entrance;Assistance with cooking/housework;Assist for transportation   Equipment Recommendations  BSC/3in1    Recommendations for Other Services      Precautions / Restrictions Precautions Precautions: Fall Restrictions Weight Bearing  Restrictions: No       Mobility Bed Mobility               General bed mobility comments: Pt seated EOB at start and end of session    Transfers Overall transfer level: Needs assistance Equipment used: Rolling walker (2 wheels), Straight cane Transfers: Sit to/from Stand Sit to Stand: Min guard                 Balance Overall balance assessment: Needs assistance Sitting-balance support: No upper extremity supported, Feet supported Sitting balance-Leahy Scale: Good     Standing balance support: Single extremity supported, During functional activity Standing balance-Leahy Scale: Fair                             ADL either performed or assessed with clinical judgement   ADL Overall ADL's : Needs assistance/impaired                                       General ADL Comments: CGA to perform grooming tasks at the sink with intermittent single UE support for balance. MIN A for balance during simulated toilet t/f and ambulation in hallway with SPC ~157ft - several observed LOB, but able to correct.      Cognition Arousal/Alertness: Awake/alert Behavior During Therapy: WFL for tasks assessed/performed Overall Cognitive Status: Within Functional Limits for tasks assessed  Pertinent Vitals/ Pain       Pain Assessment Pain Assessment: Faces Faces Pain Scale: No hurt   Frequency  Min 2X/week        Progress Toward Goals  OT Goals(current goals can now be found in the care plan section)  Progress towards OT goals: Progressing toward goals  Acute Rehab OT Goals Patient Stated Goal: to return PLOF OT Goal Formulation: With patient Time For Goal Achievement: 07/10/22 Potential to Achieve Goals: Good ADL Goals Pt Will Perform Grooming: with modified independence Pt Will Perform Lower Body Dressing: with modified independence;sitting/lateral leans Pt Will  Transfer to Toilet: with modified independence;stand pivot transfer;ambulating;regular height toilet  Plan Frequency remains appropriate;Discharge plan needs to be updated       AM-PAC OT "6 Clicks" Daily Activity     Outcome Measure   Help from another person eating meals?: None Help from another person taking care of personal grooming?: A Little Help from another person toileting, which includes using toliet, bedpan, or urinal?: A Little Help from another person bathing (including washing, rinsing, drying)?: A Little Help from another person to put on and taking off regular upper body clothing?: A Little Help from another person to put on and taking off regular lower body clothing?: A Lot 6 Click Score: 18    End of Session Equipment Utilized During Treatment: Rolling walker (2 wheels);Gait belt  OT Visit Diagnosis: Unsteadiness on feet (R26.81);Muscle weakness (generalized) (M62.81)   Activity Tolerance Patient tolerated treatment well   Patient Left in bed;with call bell/phone within reach;Other (comment) (seated EOB)   Nurse Communication          Time: 2263-3354 OT Time Calculation (min): 32 min  Charges: OT General Charges $OT Visit: 1 Visit OT Treatments $Self Care/Home Management : 23-37 mins  [employer], OTDS  [employer] 06/28/2022, 1:10 PM

## 2022-06-29 ENCOUNTER — Inpatient Hospital Stay: Payer: Medicare Other

## 2022-06-29 DIAGNOSIS — R29898 Other symptoms and signs involving the musculoskeletal system: Secondary | ICD-10-CM

## 2022-06-29 DIAGNOSIS — N390 Urinary tract infection, site not specified: Secondary | ICD-10-CM | POA: Diagnosis not present

## 2022-06-29 DIAGNOSIS — E785 Hyperlipidemia, unspecified: Secondary | ICD-10-CM | POA: Diagnosis not present

## 2022-06-29 DIAGNOSIS — R42 Dizziness and giddiness: Secondary | ICD-10-CM | POA: Diagnosis not present

## 2022-06-29 DIAGNOSIS — H8120 Vestibular neuronitis, unspecified ear: Secondary | ICD-10-CM | POA: Diagnosis not present

## 2022-06-29 LAB — BASIC METABOLIC PANEL
Anion gap: 8 (ref 5–15)
BUN: 29 mg/dL — ABNORMAL HIGH (ref 8–23)
CO2: 26 mmol/L (ref 22–32)
Calcium: 8.9 mg/dL (ref 8.9–10.3)
Chloride: 102 mmol/L (ref 98–111)
Creatinine, Ser: 0.9 mg/dL (ref 0.61–1.24)
GFR, Estimated: >60 mL/min (ref >60)
Glucose, Bld: 281 mg/dL — ABNORMAL HIGH (ref 70–99)
Potassium: 3.3 mmol/L — ABNORMAL LOW (ref 3.5–5.1)
Sodium: 136 mmol/L (ref 135–145)

## 2022-06-29 LAB — CBC
HCT: 42.5 % (ref 39.0–52.0)
Hemoglobin: 14.7 g/dL (ref 13.0–17.0)
MCH: 30.2 pg (ref 26.0–34.0)
MCHC: 34.6 g/dL (ref 30.0–36.0)
MCV: 87.4 fL (ref 80.0–100.0)
Platelets: 190 10*3/uL (ref 150–400)
RBC: 4.86 MIL/uL (ref 4.22–5.81)
RDW: 14 % (ref 11.5–15.5)
WBC: 8.4 10*3/uL (ref 4.0–10.5)
nRBC: 0 % (ref 0.0–0.2)

## 2022-06-29 LAB — GLUCOSE, CAPILLARY
Glucose-Capillary: 242 mg/dL — ABNORMAL HIGH (ref 70–99)
Glucose-Capillary: 355 mg/dL — ABNORMAL HIGH (ref 70–99)

## 2022-06-29 MED ORDER — INSULIN DETEMIR 100 UNIT/ML ~~LOC~~ SOLN
5.0000 [IU] | Freq: Once | SUBCUTANEOUS | Status: AC
  Administered 2022-06-29: 5 [IU] via SUBCUTANEOUS
  Filled 2022-06-29: qty 0.05

## 2022-06-29 MED ORDER — INSULIN DETEMIR 100 UNIT/ML ~~LOC~~ SOLN
15.0000 [IU] | Freq: Two times a day (BID) | SUBCUTANEOUS | Status: DC
  Filled 2022-06-29: qty 0.15

## 2022-06-29 MED ORDER — POTASSIUM CHLORIDE CRYS ER 20 MEQ PO TBCR
40.0000 meq | EXTENDED_RELEASE_TABLET | Freq: Once | ORAL | Status: AC
  Administered 2022-06-29: 40 meq via ORAL
  Filled 2022-06-29: qty 2

## 2022-06-29 MED ORDER — STERILE WATER FOR INJECTION IJ SOLN
INTRAMUSCULAR | Status: AC
  Administered 2022-06-29: 2.1 mL
  Filled 2022-06-29: qty 10

## 2022-06-29 MED ORDER — MECLIZINE HCL 12.5 MG PO TABS: 12.5000 mg | ORAL_TABLET | Freq: Three times a day (TID) | ORAL | 1 refills | Status: DC | PRN

## 2022-06-29 NOTE — TOC Progression Note (Signed)
Transition of Care [facility]) - Progression Note    Patient Details  Name: Patrick Patel MRN: [id number] Date of Birth: [birth year]  [facility] CM/SW Contact  Caryn Section, RN Phone Number: 06/29/2022, 3:02 PM  Clinical Narrative:   Dan Humphreys at bedside, patient discharging  He prefers OPPT, care team including therapy and nurse aware.  Refuses HomeHealth    Expected Discharge Plan: Home/Self Care Barriers to Discharge: Continued Medical Work up  Expected Discharge Plan and Services Expected Discharge Plan: Home/Self Care In-house Referral: Clinical Social Work   Post Acute Care Choice: NA Living arrangements for the past 2 months: Single Family Home Expected Discharge Date: 06/29/22                                     Social Determinants of Health (SDOH) Interventions    Readmission Risk Interventions     No data to display

## 2022-06-29 NOTE — Progress Notes (Signed)
Pt. Concerned discharging without antibiotics. MD, D. Janee Morn, notified. RN  instructed to inform patient that he has completed a full five day course antibiotics. WBC WNL, Pt remains Afebrile and culture showed insignificant growth. Information provided to pt. Pt states he will follow up with PCP. Pt belonging gathered and returned for d/c.

## 2022-06-29 NOTE — Care Management Important Message (Signed)
Important Message  Patient Details  Name: Patrick Patel MRN: [id number] Date of Birth: May 08, 1941   Medicare Important Message Given:  Yes     [name] A Sonakshi Rolland 06/29/2022, 11:20 AM

## 2022-06-29 NOTE — Discharge Summary (Signed)
Physician Discharge Summary  Patrick Patel GBE:[id number] DOB: 08-Dec-1941 DOA: 06/24/2022  PCP: [name], MD  Admit date: 06/24/2022 Discharge date: 06/29/2022  Time spent: 60 minutes  Recommendations for Outpatient Follow-up:  Follow-up with [name], MD in 2 weeks.  On follow-up patient will need a basic metabolic profile done to follow-up on electrolytes and renal function.  Patient's diabetes will need to be followed up upon. Follow-up with ENT, Dr.Juengel as scheduled.   Discharge Diagnoses:  Principal Problem:   Vestibular neuronitis Active Problems:   Dizziness   Acute lower UTI   Type 2 diabetes mellitus without complications (HCC)   Dyslipidemia   Lower extremity pain, bilateral   Discharge Condition: Stable and improved.  Diet recommendation: Carb modified  Filed Weights   06/24/22 1104  Weight: 90.7 kg    History of present illness:  HPI per Dr. Mamie Patel is a 81 y.o. Caucasian male with medical history significant for type 2 diabetes mellitus, hypertension and dyslipidemia, presented to the emergency room with acute onset of dizziness with ataxia.  He was seen for right lower extremity pain at his PCPs doctor and was prescribed meloxicam.  He took it around 5 PM and at 9 PM and went to bed.  He woke up at 10 PM with mild dizziness which has been worsening.[name]  He felt his eyes were darting back and forth which was making it difficult for him to ambulate.  No paresthesias or focal muscle weakness.  No nausea or vomiting or abdominal pain.  No chest pain or palpitations.  Due to pain in the right leg he feels its weak.  No recent trauma or falls.  No dysuria, oliguria or hematuria or flank pain.  No bleeding diathesis.   ED Course: When he came to the ER, BP was 153/67 with otherwise normal vital signs.  Labs revealed a CMP remarkable for mild hypokalemia of 3.4 and hyperglycemia of 205 with a BUN of 24.  High sensitive troponin I was 9 and  later 10. CBC was within normal.  UA showed more than 500 glucose, 5 ketones and trace leukocytes with 6-10 WBCs and many bacteria. EKG as reviewed by me : Initial EKG showed atrial fibrillation with controlled ventricular sponsor 68 with right bundle branch block.  Repeat EKG showed normal sinus rhythm with a rate of 80 with short PR and [place] and right bundle branch block Imaging: CTA of the head and neck revealed the following: 1. Negative for large vessel occlusion.   2. 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 But no stenosis greater than 50% is identified.   3. Less pronounced posterior circulation atherosclerosis. The left vertebral artery arises directly from the arch.   4. No acute intracranial abnormality by CT. Mild for age nonspecific cerebral white matter changes.   5. Aortic Atherosclerosis.  Brain MRI without contrast revealed mild age-related cerebral atrophy with chronic small vessel ischemic disease with no acute intracranial abnormalities.  The patient was given 25 mg of p.o. meclizine and 50 mg of p.o. tramadol.  The patient will be admitted to a medical telemetry observation bed for further evaluation and management.  Hospital Course:  #1 dizziness/ataxia -Patient presented with dizziness, ataxia, horizontal nystagmus, gait dysfunction concerning for vestibular neuronitis. -MRI brain done with no acute abnormalities/or acute stroke noted. -TSH within normal limits. -LFTs within normal limits. -CT angiogram head and neck done with no acute significant abnormalities. -Patient assessed by ENT who  feel patient likely has a vestibular neuronitis. -Patient with significant clinical improvement. -Patient refusing SNF at this time, refusing home health therapies as he states there is no way to do therapy in his home in his current living situation and would prefer outpatient therapies. -Patient maintained on meclizine as needed which  patient be discharged on.   -Outpatient follow-up with ENT for further evaluation and management as well as being set up for VNG/audiogram.  -Patient was discharged in stable and improved condition.   2.  Bilateral lower extremity pain -Patient with complaints of bilateral knee and lower extremity pain ongoing for several months and requesting CT scan of both lower extremities. -Plain films of bilateral knees negative for any acute abnormalities however consistent with degenerative arthritis.   -Bilateral lower extremity Dopplers negative for DVT.   -Outpatient follow-up with PCP.  3.  Hypokalemia -Repleted.  4.  Acute UTI with hematuria -Patient with symptomatic UTI, dysuria and burning sensation. -Urine cultures done with insignificant growth. -Patient received 5 days worth of IV Rocephin as well as 3 days of Pyridium.   -Patient remained afebrile, normal white count, no further antibiotics needed on discharge.   -Outpatient follow-up with PCP.   5.  Diabetes mellitus type 2 with hyperglycemia -Hemoglobin A1c 7.1 (08/30/2022) -CBGs noted to be trending up. -Placed on Levemir and dose uptitrated to 15 units twice daily, and patient maintained on sliding scale insulin.  -Home regimen oral hypoglycemia agents of Glucotrol, Trulicity, NPH, Jardiance were held during the hospitalization and will be resumed on discharge. -Patient was followed by diabetic coordinator during the hospitalization.  6.  Hyperlipidemia -Patient maintained on statin.  7.  Deconditioning/debility/generalized weakness -Patient assessed by PT and refused SNF placement and wishes to go to outpatient PT. Patient will be discharged home with rolling walker and will be set up for outpatient PT.     Procedures: CT angiogram head and neck 06/24/2022 MRI brain 06/24/2022 Lower extremity Dopplers 06/29/2022 Plain films of bilateral knees 06/28/2022  Consultations: ENT: Dr. Kathyrn Sheriff 06/26/2022 Neurology: Dr. Cheral Marker  06/24/2022    Discharge Exam: Vitals:   06/29/22 0501 06/29/22 0812  BP: 125/69 (!) 141/62  Pulse: 72 67  Resp: 17 16  Temp: 98.1 F (36.7 C) 98.5 F (36.9 C)  SpO2: 95% 96%    General: NAD Cardiovascular: Regular rate rhythm no murmurs rubs or gallops.  No JVD.  No lower extremity edema. Respiratory: Clear to auscultation bilaterally.  No wheezes, no crackles, no rhonchi.  Discharge Instructions   Discharge Instructions     Ambulatory referral to Occupational Therapy   Complete by: As directed    Ambulatory referral to Physical Therapy   Complete by: As directed    Diet Carb Modified   Complete by: As directed    Increase activity slowly   Complete by: As directed       Allergies as of 06/29/2022       Reactions   Bactrim [sulfamethoxazole-trimethoprim] Swelling, Rash   Pioglitazone Swelling   Rosiglitazone Other (See Comments)   Ankle swelling        Medication List     TAKE these medications    Accu-Chek Aviva Plus test strip Generic drug: glucose blood USE TO CHECK BLOOD GLUCOSE  3 TIMES DAILY   Accu-Chek Softclix Lancets lancets Use as instructedUSE TO CHECK BLOOD GLUCOSE 3 TIMES DAILY   acetaminophen 500 MG tablet Commonly known as: TYLENOL Take 1,000 mg by mouth 2 (two) times daily at 10 AM and  5 PM.   alfuzosin 10 MG 24 hr tablet Commonly known as: UROXATRAL TAKE 1 TABLET BY MOUTH  DAILY WITH BREAKFAST   aspirin EC 325 MG tablet Take 650 mg by mouth daily.   blood glucose meter kit and supplies Kit Dispense Accu-Chek Aviva plus. Check once daily as directed. For ICD 10 code E11.9.   Calcium-Magnesium-Zinc 167-83-8 MG Tabs Take 4 tablets by mouth daily.   chlorthalidone 50 MG tablet Commonly known as: HYGROTON TAKE 1 TABLET BY MOUTH  DAILY   Cranberry 125 MG Tabs Take 4 tablets by mouth daily.   diphenhydrAMINE 25 MG tablet Commonly known as: BENADRYL Take 50 mg by mouth at bedtime as needed for sleep.   famotidine 20 MG  tablet Commonly known as: PEPCID TAKE 1 TABLET BY MOUTH AT  BEDTIME   Fish Oil 1000 MG Cpdr Take 4 capsules by mouth every other day.   FreeStyle Libre 14 Day Sensor Misc Apply every 14 days, used to check glucose at least 3 times a day, diagnosis code E11.9   FreeStyle Libre 2 Reader Devi Use at least every 8 hours to read your blood glucose   glipiZIDE 10 MG tablet Commonly known as: GLUCOTROL TAKE 2 TABLETS BY MOUTH  TWICE DAILY   insulin NPH Human 100 UNIT/ML injection Commonly known as: HumuLIN N INJECT SUBCUTANEOUSLY 20  UNITS IN THE MORNING AND 15 UNITS AT BEDTIME   Insulin Syringe-Needle U-100 31G X 5/16" 0.3 ML Misc Commonly known as: BD Insulin Syringe U/F USE TWICE DAILY WITH  INSULIN   Jardiance 25 MG Tabs tablet Generic drug: empagliflozin TAKE 1 TABLET BY MOUTH  DAILY   meclizine 12.5 MG tablet Commonly known as: ANTIVERT Take 1 tablet (12.5 mg total) by mouth 3 (three) times daily as needed for dizziness (Ataxia and vertigo).   meloxicam 7.5 MG tablet Commonly known as: MOBIC Take 1 tablet (7.5 mg total) by mouth daily.   metFORMIN 500 MG 24 hr tablet Commonly known as: GLUCOPHAGE-XR TAKE 4 TABLETS BY MOUTH  ONCE DAILY WITH BREAKFAST   multivitamin with minerals tablet Take 2 tablets by mouth daily.   rosuvastatin 20 MG tablet Commonly known as: CRESTOR TAKE 1 TABLET BY MOUTH  DAILY   triamcinolone cream 0.1 % Commonly known as: KENALOG APPLY CREAM EXTERNALLY 2  TIMES DAILY AS NEEDED   Trulicity 3.41 PF/7.9KW Sopn Generic drug: Dulaglutide Inject 0.75 mg into the skin once a week.   Vitamin D (Ergocalciferol) 1.25 MG (50000 UNIT) Caps capsule Commonly known as: DRISDOL Take 1 capsule (50,000 Units total) by mouth every 7 (seven) days.               Durable Medical Equipment  (From admission, onward)           Start     Ordered   06/28/22 0826  For home use only DME Walker rolling  Once       Question Answer Comment  Walker:  With 5 Inch Wheels   Patient needs a walker to treat with the following condition Vestibular neuronitis      06/28/22 0825           Allergies  Allergen Reactions   Bactrim [Sulfamethoxazole-Trimethoprim] Swelling and Rash   Pioglitazone Swelling   Rosiglitazone Other (See Comments)    Ankle swelling    Follow-up Information     [name], MD. Schedule an appointment as soon as possible for a visit in 2 week(s).   Specialty: Family  Medicine Contact information: [address] [place] [address] [phone]         Margaretha Sheffield, MD Follow up.   Specialty: Otolaryngology Why: f/u as scheduled. Contact information: [place]. [place] [address] [phone]                  The results of significant diagnostics from this hospitalization (including imaging, microbiology, ancillary and laboratory) are listed below for reference.    Significant Diagnostic Studies: US Venous Img Lower Bilateral (DVT)  Result Date: 06/29/2022 CLINICAL DATA:  Bilateral lower extremity pain and edema for the past week. Evaluate for DVT. EXAM: BILATERAL LOWER EXTREMITY VENOUS DOPPLER ULTRASOUND TECHNIQUE: Gray-scale sonography with graded compression, as well as color Doppler and duplex ultrasound were performed to evaluate the lower extremity deep venous systems from the level of the common femoral vein and including the common femoral, femoral, profunda femoral, popliteal and calf veins including the posterior tibial, peroneal and gastrocnemius veins when visible. The superficial great saphenous vein was also interrogated. Spectral Doppler was utilized to evaluate flow at rest and with distal augmentation maneuvers in the common femoral, femoral and popliteal veins. COMPARISON:  None Available. FINDINGS: RIGHT LOWER EXTREMITY Common Femoral Vein: No evidence of thrombus. Normal compressibility, respiratory phasicity and response to augmentation.  Saphenofemoral Junction: No evidence of thrombus. Normal compressibility and flow on color Doppler imaging. Profunda Femoral Vein: No evidence of thrombus. Normal compressibility and flow on color Doppler imaging. Femoral Vein: No evidence of thrombus. Normal compressibility, respiratory phasicity and response to augmentation. Popliteal Vein: No evidence of thrombus. Normal compressibility, respiratory phasicity and response to augmentation. Calf Veins: No evidence of thrombus. Normal compressibility and flow on color Doppler imaging. Superficial Great Saphenous Vein: No evidence of thrombus. Normal compressibility. Other Findings:  None. LEFT LOWER EXTREMITY Common Femoral Vein: No evidence of thrombus. Normal compressibility, respiratory phasicity and response to augmentation. Saphenofemoral Junction: No evidence of thrombus. Normal compressibility and flow on color Doppler imaging. Profunda Femoral Vein: No evidence of thrombus. Normal compressibility and flow on color Doppler imaging. Femoral Vein: No evidence of thrombus. Normal compressibility, respiratory phasicity and response to augmentation. Popliteal Vein: No evidence of thrombus. Normal compressibility, respiratory phasicity and response to augmentation. Calf Veins: No evidence of thrombus. Normal compressibility and flow on color Doppler imaging. Superficial Great Saphenous Vein: No evidence of thrombus. Normal compressibility. Other Findings:  None. IMPRESSION: No evidence of DVT within either lower extremity. Electronically Signed   By: Sandi Mariscal M.D.   On: 06/29/2022 11:38   DG Knee Complete 4 Views Left  Result Date: 06/28/2022 CLINICAL DATA:  Pain both knees EXAM: LEFT KNEE - COMPLETE 4+ VIEW COMPARISON:  None Available. FINDINGS: No fracture or dislocation is seen. There is no significant effusion in suprapatellar bursa. Degenerative changes with small bony spurs are seen in medial, lateral and patellofemoral compartments. Degenerative  changes in the left knee are much milder in comparison to the right knee. Arterial calcifications are seen in soft tissues. IMPRESSION: No recent fracture or dislocation is seen in left knee. Degenerative changes are noted with small bony spurs in medial, lateral and patellofemoral compartments. Electronically Signed   By: Elmer Picker M.D.   On: 06/28/2022 20:41   DG Knee Complete 4 Views Right  Result Date: 06/28/2022 CLINICAL DATA:  Pain both knees EXAM: RIGHT KNEE - COMPLETE 4+ VIEW COMPARISON:  None Available. FINDINGS: No recent fracture or dislocation is seen. There is no significant effusion  in suprapatellar bursa. There are multiple smoothly marginated calcifications of varying sizes in the suprapatellar bursa, possibly loose bodies due to degenerative arthritis. Largest of these calcifications measures 2.3 cm in diameter. Degenerative changes are noted with bony spurs in medial, lateral and patellofemoral compartments. There is marked narrowing of joint space in the lateral compartment. Scattered arterial calcifications are seen in the soft tissues. IMPRESSION: No recent fracture or dislocation is seen in right knee. Degenerative changes are noted more severe in the lateral compartment. There are multiple smoothly marginated calcifications in the suprapatellar bursa suggesting loose bodies related to degenerative arthritis. Electronically Signed   By: Elmer Picker M.D.   On: 06/28/2022 20:40   MR BRAIN WO CONTRAST  Result Date: 06/24/2022 CLINICAL DATA:  Follow-up examination for stroke. EXAM: MRI HEAD WITHOUT CONTRAST TECHNIQUE: Multiplanar, multiecho pulse sequences of the brain and surrounding structures were obtained without intravenous contrast. COMPARISON:  Prior CT from earlier the same day. 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 No acute or subacute infarct. Gray-white matter differentiation maintained. No areas of  chronic cortical infarction. No acute or chronic intracranial blood products. No mass lesion, midline shift or mass effect no hydrocephalus or extra-axial fluid collection. Pituitary gland and suprasellar region within normal limits. Vascular: Major intracranial vascular flow voids are maintained. Skull and upper cervical spine: Craniocervical junction normal. Bone marrow signal intensity normal. No scalp soft tissue abnormality. Sinuses/Orbits: Globes orbital soft tissues within normal limits. Paranasal sinuses and mastoid air cells are clear. Other: None. IMPRESSION: 1. No acute intracranial abnormality. 2. 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 Electronically Signed   By: Jeannine Boga M.D.   On: 06/24/2022 23:23   CT ANGIO HEAD NECK W WO CM  Result Date: 06/24/2022 CLINICAL DATA:  81 year old male with dizziness. EXAM: CT ANGIOGRAPHY HEAD AND NECK TECHNIQUE: Multidetector CT imaging of the head and neck was performed using the standard protocol during bolus administration of intravenous contrast. Multiplanar CT image reconstructions and MIPs were obtained to evaluate the vascular anatomy. Carotid stenosis measurements (when applicable) are obtained utilizing NASCET criteria, using the distal internal carotid diameter as the denominator. RADIATION DOSE REDUCTION: This exam was performed according to the departmental dose-optimization program which includes automated exposure control, adjustment of the mA and/or kV according to patient size and/or use of iterative reconstruction technique. CONTRAST:  46m OMNIPAQUE IOHEXOL 350 MG/ML SOLN COMPARISON:  Report of head CT 03/27/2010 (no images available). FINDINGS: CT HEAD Brain: Cavum septum pellucidum, normal variant. Cerebral volume is probably within normal limits for age. No midline shift, ventriculomegaly, mass effect, evidence of mass lesion, intracranial hemorrhage or evidence of cortically based acute  infarction. Patchy bilateral cerebral white matter hypodensity is mild for age. No cortical encephalomalacia identified. Calvarium and skull base: No acute osseous abnormality identified. Paranasal sinuses: Visualized paranasal sinuses and mastoids are clear. Tympanic cavities appear clear. Orbits: Visualized orbits and scalp soft tissues are within normal limits. No acute osseous abnormality identified. CTA NECK Skeleton: Exaggerated cervical lordosis. Multilevel upper thoracic ankylosis related to flowing endplate osteophytes or syndesmophytes. No acute osseous abnormality identified. Upper chest: Increased AP dimension to the chest with mild dependent atelectasis along the major fissures. No superior mediastinal lymphadenopathy. Other neck: Negative. Aortic arch: Calcified aortic atherosclerosis. Four vessel arch configuration, the left vertebral artery arises directly from the arch on series 9, image 155. Right carotid system: Mild brachiocephalic artery plaque. Mild to moderate right CCA calcified plaque without  stenosis. Soft plaque in the ventral vessel at the level of the hyoid, no significant stenosis. Calcified plaque at the right ICA origin and bulb. Less than 50 % stenosis with respect to the distal vessel. Left carotid system: Similar left CCA atherosclerosis without significant stenosis. Bulky calcified and soft atherosclerosis of the left ICA origin and especially the bulb. Up to 50 % stenosis with respect to the distal vessel at the distal bulb. Tortuous left ICA with additional calcified plaque just below the skull base. Vertebral arteries: Proximal right subclavian and right vertebral artery origin plaque without stenosis. Right vertebral artery otherwise patent and normal to the skull base. Left vertebral artery arises directly from the arch with calcified plaque at its origin but only mild origin stenosis. Fairly codominant left vertebral artery than is patent to the skull base with no additional  plaque or stenosis. CTA HEAD Posterior circulation: Codominant vertebral V4 segments are patent to the vertebrobasilar junction. There is mild left V3 calcified plaque but no stenosis. Patent PICA origins. Patent basilar artery without stenosis. Patent SCA origins. Fetal type bilateral PCA origins. Bilateral PCA branches are patent. There is mild right P2 irregularity and stenosis. Anterior circulation: Both ICA siphons are patent. Extensive cavernous and supraclinoid segment left siphon calcified plaque but only mild left siphon stenosis. Normal left ophthalmic and posterior communicating artery origins. Similar extensive right siphon calcified plaque with mild supraclinoid stenosis. Normal right posterior communicating artery origin. Patent carotid termini, MCA and ACA origins. Right ACA A1 is dominant. Normal anterior communicating artery. Bilateral ACA branches are tortuous and otherwise within normal limits. Left MCA M1 segment and bifurcation are patent without stenosis. Right MCA M1 segment is mildly tortuous and irregular but without stenosis to the bifurcation. Bilateral MCA branches are within normal limits. Venous sinuses: Early contrast timing, not well evaluated. Anatomic variants: Fetal type bilateral PCA origins. Dominant right ACA A1. Left vertebral artery arises directly from the arch. Review of the MIP images confirms the above findings IMPRESSION: 1. Negative for large vessel occlusion. 2. 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 But no stenosis greater than 50% is identified. 3. Less pronounced posterior circulation atherosclerosis. The left vertebral artery arises directly from the arch. 4. No acute intracranial abnormality by CT. Mild for age nonspecific cerebral white matter changes. 5. Aortic Atherosclerosis (ICD10-I70.0). Electronically Signed   By: Genevie Ann M.D.   On: 06/24/2022 13:08    Microbiology: Recent Results (from the past 240 hour(s))   Urine Culture     Status: Abnormal   Collection Time: 06/24/22 11:17 AM   Specimen: Urine, Clean Catch  Result Value Ref Range Status   Specimen Description   Final    URINE, CLEAN CATCH Performed at [facility], [address] [place], [place] [address]    Special Requests   Final    NONE Performed at [facility], [address], [place], [place] [address]    Culture (A)  Final    <10,000 COLONIES/mL INSIGNIFICANT GROWTH Performed at [facility] [address] [place],  [address]    Report Status 06/26/2022 FINAL  Final  SARS Coronavirus 2 by RT PCR (hospital order, performed in [facility] hospital lab) *cepheid single result test* Anterior Nasal Swab     Status: None   Collection Time: 06/25/22 12:26 AM   Specimen: Anterior Nasal Swab  Result Value Ref Range Status   SARS Coronavirus 2 by RT PCR NEGATIVE NEGATIVE Final  Comment: (NOTE) SARS-CoV-2 target nucleic acids are NOT DETECTED.  The SARS-CoV-2 RNA is generally detectable in upper and lower respiratory specimens during the acute phase of infection. The lowest concentration of SARS-CoV-2 viral copies this assay can detect is 250 copies / mL. A negative result does not preclude SARS-CoV-2 infection and should not be used as the sole basis for treatment or other patient management decisions.  A negative result may occur with improper specimen collection / handling, submission of specimen other than nasopharyngeal swab, presence of viral mutation(s) within the areas targeted by this assay, and inadequate number of viral copies (<250 copies / mL). A negative result must be combined with clinical observations, patient history, and epidemiological information.  Fact Sheet for Patients:   https://www.patel.info/  Fact Sheet for Healthcare Providers: https://hall.com/  This test is not yet approved or  cleared by the [place] FDA  and has been authorized for detection and/or diagnosis of SARS-CoV-2 by FDA under an Emergency Use Authorization (EUA).  This EUA will remain in effect (meaning this test can be used) for the duration of the COVID-19 declaration under Section 564(b)(1) of the Act, 21 U.S.C. section 360bbb-3(b)(1), unless the authorization is terminated or revoked sooner.  Performed at [facility], [address] [place], [place] [address]      Labs: Basic Metabolic Panel: Recent Labs  Lab 06/24/22 1117 06/25/22 0120 06/25/22 0350 06/26/22 0342 06/27/22 0356 06/28/22 0514 06/29/22 0358  NA 139  --  139 138 139 137 136  K 3.4*  --  3.4* 3.8 3.5 3.9 3.3*  CL 104  --  105 105 106 101 102  CO2 25  --  _0 GLUCOSE 205*  --  148* 149* 161* 261* 281*  BUN 24*  --  24* 20 21 27* 29*  CREATININE 0.81   < > 0.91 0.87 0.91 1.04 0.90  CALCIUM 8.9  --  9.2 8.9 9.0 8.9 8.9  MG 2.3  --   --   --   --  2.2  --    < > = values in this interval not displayed.   Liver Function Tests: Recent Labs  Lab 06/24/22 1117  AST 19  ALT 15  ALKPHOS 71  BILITOT 0.9  PROT 7.2  ALBUMIN 3.6   No results for input(s): "LIPASE", "AMYLASE" in the last 168 hours. No results for input(s): "AMMONIA" in the last 168 hours. CBC: Recent Labs  Lab 06/24/22 1117 06/25/22 0120 06/25/22 0350 06/26/22 0342 06/28/22 0514 06/29/22 0358  WBC 10.1 10.1 9.8  --  8.7 8.4  NEUTROABS 7.1  --   --   --   --   --   HGB 14.9 15.3 15.0 14.8 14.1 14.7  HCT 44.7 45.1 45.0 43.8 42.7 42.5  MCV 88.7 88.3 89.6  --  88.6 87.4  PLT 215 228 233  --  182 190   Cardiac Enzymes: No results for input(s): "CKTOTAL", "CKMB", "CKMBINDEX", "TROPONINI" in the last 168 hours. BNP: BNP (last 3 results) No results for input(s): "BNP" in the last 8760 hours.  ProBNP (last 3 results) No results for input(s): "PROBNP" in the last 8760 hours.  CBG: Recent Labs  Lab 06/28/22 1140 06/28/22 1633 06/28/22 2058  06/29/22 0814 06/29/22 1145  GLUCAP 357* 301* 240* 242* 355*       Signed:  Irine Seal MD.  [facility] 06/29/2022, 4:20 PM

## 2022-06-30 ENCOUNTER — Telehealth: Payer: Self-pay

## 2022-06-30 NOTE — Telephone Encounter (Signed)
Transition Care Management Unsuccessful Follow-up Telephone Call  Date of discharge and from where:  06/29/22 [facility]  Attempts:  1st Attempt  Reason for unsuccessful TCM follow-up call:  Unable to reach patient

## 2022-07-03 NOTE — Telephone Encounter (Signed)
Transition Care Management Follow-up Telephone Call Date of discharge and from where: 06/29/22 [facility] How have you been since you were released from the hospital? Reports still has some dizziness but refuses to take Meclizine due to written side effect that it could cause dizziness. R Leg is not hurting as much. Denies falls, chest pain, headache, blurred vision, and all other harmful symptoms since discharge. Sister lives in the house with him to assist as needed however he feels he is improving. Notes his choice of PT is with [facility]. Any questions or concerns? No  Items Reviewed: Did the pt receive and understand the discharge instructions provided? yes Medications obtained and verified? Yes  Any new allergies since your discharge? No  Dietary orders reviewed? Yes, carb modified. Do you have support at home? Yes   Home Care and Equipment/Supplies: Were home health services ordered? Patient refusing SNF at this time, refusing home health therapies as he states there is no way to do therapy in his home in his current living situation and would prefer outpatient therapies.  Functional Questionnaire: (I = Independent and D = Dependent) ADLs: I  Bathing/Dressing- I  Meal Prep- I  Eating- I  Maintaining continence- I  Transferring/Ambulation- Walker, cane  Managing Meds- I  Follow up appointments reviewed:  PCP Hospital f/u appt confirmed? Yes  Scheduled to see PCP via telephone only on 07/05/22 @ 4:30.  Declines in office and video visit. Not comfortable opening/unmasking video lenses on computer and unable to come into office. See below. Specialist Hospital f/u appt confirmed? Scheduled to see ENT on 07/17/22 and 07/25/22.  Are transportation arrangements needed? Uses public health transportation and prefers to save travel vouchers to go to PT and ENT for further evaluation and VNG/audiogram. Aware PCP needs blood work completed per discharge note. Declines in office at this time. If their  condition worsens, is the pt aware to call PCP or go to the Emergency Dept.? Yes Was the patient provided with contact information for the PCP's office or ED? Yes Was to pt encouraged to call back with questions or concerns? Yes

## 2022-07-03 NOTE — Telephone Encounter (Signed)
Reviewed

## 2022-07-05 ENCOUNTER — Ambulatory Visit (INDEPENDENT_AMBULATORY_CARE_PROVIDER_SITE_OTHER): Payer: Medicare Other | Admitting: Family Medicine

## 2022-07-05 ENCOUNTER — Encounter: Payer: Self-pay | Admitting: Family Medicine

## 2022-07-05 DIAGNOSIS — M79605 Pain in left leg: Secondary | ICD-10-CM

## 2022-07-05 DIAGNOSIS — I10 Essential (primary) hypertension: Secondary | ICD-10-CM | POA: Diagnosis not present

## 2022-07-05 DIAGNOSIS — M79604 Pain in right leg: Secondary | ICD-10-CM

## 2022-07-05 DIAGNOSIS — H8120 Vestibular neuronitis, unspecified ear: Secondary | ICD-10-CM

## 2022-07-05 NOTE — Progress Notes (Signed)
Virtual Visit via telephone Note  This visit type was conducted due to national recommendations for restrictions regarding the COVID-19 pandemic (e.g. social distancing).  This format is felt to be most appropriate for this patient at this time.  All issues noted in this document were discussed and addressed.  No physical exam was performed (except for noted visual exam findings with Video Visits).   I connected with Patrick Patel today at  4:30 PM EDT by telephone and verified that I am speaking with the correct person using two identifiers. Location patient: home Location provider: work  Persons participating in the virtual visit: patient, provider  I discussed the limitations, risks, security and privacy concerns of performing an evaluation and management service by telephone and the availability of in person appointments. I also discussed with the patient that there may be a patient responsible charge related to this service. The patient expressed understanding and agreed to proceed.  Interactive audio and video telecommunications were attempted between this provider and patient, however failed, due to patient having technical difficulties OR patient did not have access to video capability.  We continued and completed visit with audio only.   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  He notes he had taken meloxicam at 5 PM and then woke up after falling asleep dizzy around 10 PM.  He noted his vision was going back and forth left and right.  He was evaluated in the hospital for this.  He had an MRI brain and CT angiogram head and neck that did not reveal a cause for his symptoms.  He was seen by ENT and it sounds as though they felt as though he had vestibular neuronitis and they recommended outpatient follow-up.  He notes the vision issues have resolved completely.  He notes that his dizziness is about 40% of what it  was.  He notes PT and OT have been scheduled for him as an outpatient.  He was initially recommended to go to SNF though he did not want to do that.  He notes he wants to see how the PT and OT evaluations go prior to scheduling follow-up with ENT.  Bilateral leg pain: Patient had an ultrasound that was negative for DVT.  He had x-rays that revealed arthritis in his bilateral knees.  At this point he notes his pain is significantly better and he only needs two Tylenol to help with the pain.  He has previously been referred to orthopedics.  Hypertension: Patient's blood pressures have been between 132-160/56-77.   ROS: See pertinent positives and negatives per HPI.  Past Medical History:  Diagnosis Date   Diabetes ([place])    Hyperlipidemia    Hypertension     No past surgical history on file.  Family History  Problem Relation Age of Onset   Breast cancer Mother    Thyroid cancer Mother     SOCIAL HX: Former smoker   Current Outpatient Medications:    ACCU-CHEK AVIVA PLUS test strip, USE TO CHECK BLOOD GLUCOSE  3 TIMES DAILY, Disp: 300 strip, Rfl: 2   Accu-Chek Softclix Lancets lancets, Use as instructedUSE TO CHECK BLOOD GLUCOSE 3 TIMES DAILY, Disp: 300 each, Rfl: 2   acetaminophen (TYLENOL) 500 MG tablet, Take 1,000 mg by mouth 2 (two) times daily at 10 AM and 5 PM. , Disp: , Rfl:    alfuzosin (UROXATRAL) 10 MG 24 hr tablet, TAKE 1 TABLET BY MOUTH  DAILY WITH BREAKFAST, Disp: 90 tablet, Rfl: 3   aspirin EC 325 MG tablet, Take 650 mg by mouth daily. , Disp: , Rfl:    blood glucose meter kit and supplies KIT, Dispense Accu-Chek Aviva plus. Check once daily as directed. For ICD 10 code E11.9., Disp: 1 each, Rfl: 0   Calcium-Magnesium-Zinc 167-83-8 MG TABS, Take 4 tablets by mouth daily., Disp: , Rfl:    chlorthalidone (HYGROTON) 50 MG tablet, TAKE 1 TABLET BY MOUTH  DAILY, Disp: 90 tablet, Rfl: 3   Continuous Blood Gluc Receiver (FREESTYLE LIBRE 2 READER) DEVI, Use at least every 8  hours to read your blood glucose, Disp: 1 each, Rfl: 0   Continuous Blood Gluc Sensor (FREESTYLE LIBRE 14 DAY SENSOR) MISC, Apply every 14 days, used to check glucose at least 3 times a day, diagnosis code E11.9, Disp: 6 each, Rfl: 1   Cranberry 125 MG TABS, Take 4 tablets by mouth daily., Disp: , Rfl:    diphenhydrAMINE (BENADRYL) 25 MG tablet, Take 50 mg by mouth at bedtime as needed for sleep., Disp: , Rfl:    Dulaglutide (TRULICITY) 5.57 DU/2.0UR SOPN, Inject 0.75 mg into the skin once a week., Disp: 6 mL, Rfl: 1   famotidine (PEPCID) 20 MG tablet, TAKE 1 TABLET BY MOUTH AT  BEDTIME, Disp: 100 tablet, Rfl: 2   glipiZIDE (GLUCOTROL) 10 MG tablet, TAKE 2 TABLETS BY MOUTH  TWICE DAILY, Disp: 360 tablet, Rfl: 3   insulin NPH Human (HUMULIN N) 100 UNIT/ML injection, INJECT SUBCUTANEOUSLY 20  UNITS IN THE MORNING AND 15 UNITS AT BEDTIME, Disp: 40 mL, Rfl: 3   Insulin Syringe-Needle U-100 (BD INSULIN SYRINGE U/F) 31G X 5/16" 0.3 ML MISC, USE TWICE DAILY WITH  INSULIN, Disp: 180 each, Rfl: 1   JARDIANCE 25 MG TABS tablet, TAKE 1 TABLET BY MOUTH  DAILY, Disp: 100 tablet, Rfl: 2   meclizine (ANTIVERT) 12.5 MG tablet, Take 1 tablet (12.5 mg total) by mouth 3 (three) times daily as needed for dizziness (Ataxia and vertigo)., Disp: 20 tablet, Rfl: 1   meloxicam (MOBIC) 7.5 MG tablet, Take 1 tablet (7.5 mg total) by mouth daily., Disp: 90 tablet, Rfl: 0   metFORMIN (GLUCOPHAGE-XR) 500 MG 24 hr tablet, TAKE 4 TABLETS BY MOUTH  ONCE DAILY WITH BREAKFAST, Disp: 360 tablet, Rfl: 3   Multiple Vitamins-Minerals (MULTIVITAMIN WITH MINERALS) tablet, Take 2 tablets by mouth daily., Disp: , Rfl:    Omega-3 Fatty Acids (FISH OIL) 1000 MG CPDR, Take 4 capsules by mouth every other day., Disp: , Rfl:    rosuvastatin (CRESTOR) 20 MG tablet, TAKE 1 TABLET BY MOUTH  DAILY, Disp: 90 tablet, Rfl: 3   triamcinolone cream (KENALOG) 0.1 %, APPLY CREAM EXTERNALLY 2  TIMES DAILY AS NEEDED, Disp: 45 g, Rfl: 0   Vitamin D,  Ergocalciferol, (DRISDOL) 1.25 MG (50000 UNIT) CAPS capsule, Take 1 capsule (50,000 Units total) by mouth every 7 (seven) days., Disp: 8 capsule, Rfl: 0  EXAM: This was a telephone visit and thus no exam was completed.  ASSESSMENT AND PLAN:  Discussed the following assessment and plan:  Problem List Items Addressed This Visit     Essential hypertension (Chronic)    Adequately controlled.  He will continue chlorthalidone 50 mg once daily.      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  Has improved at this point.  He does have a prior referral to orthopedics.      Vestibular neuronitis  Quite a bit improved.  He will continue with physical therapy and Occupational Therapy as an outpatient.  Patient is unsure if he wants to follow-up with ENT and he will contact them to schedule an appointment when he figures that out.       No follow-ups on file.   I discussed the assessment and treatment plan with the patient. The patient was provided an opportunity to ask questions and all were answered. The patient agreed with the plan and demonstrated an understanding of the instructions.   The patient was advised to call back or seek an in-person evaluation if the symptoms worsen or if the condition fails to improve as anticipated.  I provided 17 minutes of non-face-to-face time during this encounter.   [name], MD

## 2022-07-06 NOTE — Assessment & Plan Note (Signed)
Adequately controlled.  He will continue chlorthalidone 50 mg once daily.

## 2022-07-06 NOTE — Assessment & Plan Note (Signed)
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  Has improved at this point.  He does have a prior referral to orthopedics.

## 2022-07-06 NOTE — Assessment & Plan Note (Signed)
Quite a bit improved.  He will continue with physical therapy and Occupational Therapy as an outpatient.  Patient is unsure if he wants to follow-up with ENT and he will contact them to schedule an appointment when he figures that out.

## 2022-07-17 DIAGNOSIS — R42 Dizziness and giddiness: Secondary | ICD-10-CM | POA: Diagnosis not present

## 2022-07-17 DIAGNOSIS — H903 Sensorineural hearing loss, bilateral: Secondary | ICD-10-CM | POA: Diagnosis not present

## 2022-07-19 ENCOUNTER — Telehealth: Payer: Self-pay | Admitting: Family Medicine

## 2022-07-19 NOTE — Telephone Encounter (Signed)
Copied from CRM [id number]. Topic: Medicare AWV >> Jul 19, 2022  9:11 AM Payton Doughty wrote: Reason for CRM: Left message for patient to schedule Annual Wellness Visit.  Please schedule with Nurse Health Advisor Denisa O'Brien-Blaney, LPN at [facility]. This appt can be telephone or office visit.  Please call [phone] ask for [facility]

## 2022-07-25 DIAGNOSIS — R42 Dizziness and giddiness: Secondary | ICD-10-CM | POA: Diagnosis not present

## 2022-07-25 DIAGNOSIS — H6121 Impacted cerumen, right ear: Secondary | ICD-10-CM | POA: Diagnosis not present

## 2022-08-15 ENCOUNTER — Other Ambulatory Visit: Payer: Self-pay | Admitting: Family Medicine

## 2022-08-17 ENCOUNTER — Other Ambulatory Visit: Payer: Self-pay | Admitting: Family Medicine

## 2022-08-22 ENCOUNTER — Telehealth: Payer: Self-pay | Admitting: Family Medicine

## 2022-08-22 NOTE — Telephone Encounter (Signed)
Spoke with patient he declined AWV stating he will do it after him visit with Dr Payton Doughty 09/22/22

## 2022-08-30 ENCOUNTER — Other Ambulatory Visit: Payer: Self-pay | Admitting: Family

## 2022-08-30 ENCOUNTER — Other Ambulatory Visit: Payer: Self-pay | Admitting: Family Medicine

## 2022-08-31 ENCOUNTER — Other Ambulatory Visit: Payer: Self-pay

## 2022-08-31 DIAGNOSIS — E1140 Type 2 diabetes mellitus with diabetic neuropathy, unspecified: Secondary | ICD-10-CM

## 2022-08-31 MED ORDER — GLIPIZIDE 10 MG PO TABS: 20.0000 mg | ORAL_TABLET | Freq: Two times a day (BID) | ORAL | 3 refills | Status: DC

## 2022-08-31 MED ORDER — METFORMIN HCL ER 500 MG PO TB24: ORAL_TABLET | ORAL | 3 refills | Status: DC

## 2022-08-31 MED ORDER — "INSULIN SYRINGE-NEEDLE U-100 31G X 5/16"" 0.3 ML MISC": 3 refills | Status: DC

## 2022-09-08 ENCOUNTER — Ambulatory Visit
Admission: RE | Admit: 2022-09-08 | Discharge: 2022-09-08 | Disposition: A | Discharge status: Home / Self Care | Payer: Medicare Other | Source: Ambulatory Visit

## 2022-09-08 ENCOUNTER — Telehealth: Payer: Self-pay | Admitting: Family Medicine

## 2022-09-08 VITALS — BP 124/71 | HR 93 | Temp 97.7°F | Resp 14

## 2022-09-08 DIAGNOSIS — L539 Erythematous condition, unspecified: Secondary | ICD-10-CM

## 2022-09-08 DIAGNOSIS — R600 Localized edema: Secondary | ICD-10-CM

## 2022-09-08 DIAGNOSIS — I739 Peripheral vascular disease, unspecified: Secondary | ICD-10-CM

## 2022-09-08 NOTE — ED Triage Notes (Signed)
Pt. Is c/o bilateral leg and foot swelling w/ a red rash on both lower extremities for the past two weeks. Pt. States he's been laying in the bed to relieve his swelling.

## 2022-09-08 NOTE — Telephone Encounter (Signed)
Patient called and said his rt leg is swollen 50% more then his left leg, swelling is from the knee down. No available appointments. Patient transferred to Access Nurse.

## 2022-09-08 NOTE — Telephone Encounter (Signed)
Patient called back and stated he need to be seen within 4hrs. Gae Bon advised patient to go to Urgent Care. Appointment was made for [facility] at 7:15pm today.

## 2022-09-08 NOTE — ED Provider Notes (Signed)
Patrick Patel    CSN: [id number] Arrival date & time: 09/08/22  1901      History   Chief Complaint Chief Complaint  Patient presents with   Leg Swelling    HPI Patrick Patel is a 81 y.o. male.   HPI  Presents to urgent care with complaint of right leg and foot swelling with "red rash" x2 weeks.  He states the right ankle is tender to touch.  Patient has been laying in bed to relieve swelling.  History significant with CAD, venous stasis of both lower extremities, varicose veins (most evident on the left leg), decreased pedal pulses.  Recently seen in the ED for lateral leg pain.  Ultrasound negative for DVT.  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   Dizziness 06/25/2022   Type 2 diabetes mellitus without complications ([employer]) 32/35/5732   Dyslipidemia 06/25/2022   Acute lower UTI 06/25/2022   Neck nodule 05/10/2022   Leg weakness, bilateral 08/03/2021   Bilateral leg pain 04/22/2021   Chronic fatigue 08/31/2020   Low back pain 05/26/2020   Abdominal pain 05/26/2020   Abrasion of toe of right foot 05/26/2020   Onychomycosis 05/26/2020   Pedal edema 05/26/2020   De Quervain's tenosynovitis, left 05/26/2020   Trigger finger 02/23/2020   GERD (gastroesophageal reflux disease) 11/20/2019   Hyperpigmented skin lesion 02/06/2019   Hematuria 02/04/2019   Varicose veins of bilateral lower extremities with other complications 20/25/4270   Contusion of toenail of right foot 10/25/2018   Decreased pedal pulses 03/19/2018   Lump in throat 12/17/2017   Diarrhea 02/20/2017   Dysuria 02/16/2017   Rotator cuff impingement syndrome 11/17/2016   Foreskin problem 11/17/2016   Essential hypertension 11/17/2016   CAD (coronary artery disease) 05/04/2016   Type 2 diabetes mellitus with  diabetic neuropathy, unspecified ([place]) 04/13/2016   Hyperlipidemia 04/13/2016   Chest pain 03/12/2016   BPH (benign prostatic hyperplasia) 03/12/2016   Venous stasis of both lower extremities 03/12/2016   Osteoarthritis 03/09/2015    History reviewed. No pertinent surgical history.     Home Medications    Prior to Admission medications   Medication Sig Start Date End Date Taking? Authorizing Provider  ACCU-CHEK AVIVA PLUS test strip USE TO CHECK BLOOD GLUCOSE  3 TIMES DAILY 02/28/22   Dutch Quint B, FNP  Accu-Chek Softclix Lancets lancets Use as instructedUSE TO CHECK BLOOD GLUCOSE 3 TIMES DAILY 02/03/22   [name], MD  acetaminophen (TYLENOL) 500 MG tablet Take 1,000 mg by mouth 2 (two) times daily at 10 AM and 5 PM.     [provider]  alfuzosin (UROXATRAL) 10 MG 24 hr tablet TAKE 1 TABLET BY MOUTH DAILY  WITH BREAKFAST 08/15/22   [name], MD  amoxicillin-clavulanate (AUGMENTIN) 875-125 MG tablet SMARTSIG:1 Tablet(s) By Mouth Every 12 Hours 05/11/22   [provider]  aspirin EC 325 MG tablet Take 650 mg by mouth daily.     [provider]  blood glucose meter kit and supplies KIT Dispense Accu-Chek Aviva plus. Check once daily as directed. For ICD 10 code E11.9. 12/14/16   [name], MD  Calcium-Magnesium-Zinc [phone] MG TABS Take 4 tablets by mouth daily.    [provider]  chlorthalidone (HYGROTON) 50 MG tablet TAKE  1 TABLET BY MOUTH DAILY 08/30/22   [name], MD  Continuous Blood Gluc Receiver (FREESTYLE LIBRE 2 READER) DEVI Use at least every 8 hours to read your blood glucose 08/09/21   [name], MD  Continuous Blood Gluc Sensor (FREESTYLE LIBRE 14 DAY SENSOR) MISC Apply every 14 days, used to check glucose at least 3 times a day, diagnosis code E11.9 08/03/21   [name], MD  Cranberry 125 MG TABS Take 4 tablets by mouth daily.    [provider]  diphenhydrAMINE (BENADRYL) 25 MG  tablet Take 50 mg by mouth at bedtime as needed for sleep.    [provider]  Dulaglutide (TRULICITY) 3.15 QM/0.8QP SOPN Inject 0.75 mg into the skin once a week. 05/19/22   [name], MD  famotidine (PEPCID) 20 MG tablet TAKE 1 TABLET BY MOUTH AT  BEDTIME 04/24/22   [name], MD  glipiZIDE (GLUCOTROL) 10 MG tablet Take 2 tablets (20 mg total) by mouth 2 (two) times daily. 08/31/22   [name], MD  insulin NPH Human (HUMULIN N) 100 UNIT/ML injection INJECT SUBCUTANEOUSLY 20  UNITS IN THE MORNING AND 15 UNITS AT BEDTIME 02/19/22   Dutch Quint B, FNP  Insulin Syringe-Needle U-100 (BD INSULIN SYRINGE U/F) 31G X 5/16" 0.3 ML MISC USE TWICE DAILY WITH  INSULIN 08/31/22   [name], MD  JARDIANCE 25 MG TABS tablet TAKE 1 TABLET BY MOUTH  DAILY 04/06/22   [name], MD  meclizine (ANTIVERT) 12.5 MG tablet Take 1 tablet (12.5 mg total) by mouth 3 (three) times daily as needed for dizziness (Ataxia and vertigo). 06/29/22   Eugenie Filler, MD  meloxicam (MOBIC) 7.5 MG tablet Take 1 tablet (7.5 mg total) by mouth daily. 06/23/22   Dutch Quint B, FNP  metFORMIN (GLUCOPHAGE-XR) 500 MG 24 hr tablet TAKE 4 TABLETS BY MOUTH  ONCE DAILY WITH BREAKFAST 08/31/22   [name], MD  Multiple Vitamins-Minerals (MULTIVITAMIN WITH MINERALS) tablet Take 2 tablets by mouth daily.    [provider]  Omega-3 Fatty Acids (FISH OIL) 1000 MG CPDR Take 4 capsules by mouth every other day.    [provider]  OZEMPIC, 0.25 OR 0.5 MG/DOSE, 2 MG/3ML SOPN Inject into the skin. 04/21/22   [provider]  rosuvastatin (CRESTOR) 20 MG tablet TAKE 1 TABLET BY MOUTH DAILY 08/17/22   Dutch Quint B, FNP  triamcinolone cream (KENALOG) 0.1 % APPLY CREAM EXTERNALLY 2  TIMES DAILY AS NEEDED 09/17/19   [name], MD  Vitamin D, Ergocalciferol, (DRISDOL) 1.25 MG (50000 UNIT) CAPS capsule Take 1 capsule (50,000 Units total) by mouth every 7 (seven) days.  09/01/20   [name], MD    Family History Family History  Problem Relation Age of Onset   Breast cancer Mother    Thyroid cancer Mother     Social History Social History   Tobacco Use   Smoking status: Former   Smokeless tobacco: Current    Types: Snuff  Substance Use Topics   Alcohol use: No    Alcohol/week: 0.0 standard drinks of alcohol   Drug use: No     Allergies   Bactrim [sulfamethoxazole-trimethoprim], Pioglitazone, and Rosiglitazone   Review of Systems Review of Systems   Physical Exam Triage Vital Signs ED Triage Vitals [09/08/22 1913]  Enc Vitals Group     BP 124/71     Pulse Rate 93     Resp  14     Temp 97.7 F (36.5 C)     Temp src      SpO2 96 %     Weight      Height      Head Circumference      Peak Flow      Pain Score 0     Pain Loc      Pain Edu?      Excl. in [place]?    No data found.  Updated Vital Signs BP 124/71   Pulse 93   Temp 97.7 F (36.5 C)   Resp 14   SpO2 96%   Visual Acuity Right Eye Distance:   Left Eye Distance:   Bilateral Distance:    Right Eye Near:   Left Eye Near:    Bilateral Near:     Physical Exam Vitals reviewed.  Constitutional:      Appearance: Normal appearance.  Cardiovascular:     Rate and Rhythm: Normal rate and regular rhythm.     Pulses: Normal pulses.     Heart sounds: Normal heart sounds.  Pulmonary:     Effort: Pulmonary effort is normal.     Breath sounds: Normal breath sounds.  Musculoskeletal:       Legs:  Skin:    General: Skin is warm and dry.  Neurological:     General: No focal deficit present.     Mental Status: He is alert and oriented to person, place, and time.     Sensory: Sensory deficit present.  Psychiatric:        Mood and Affect: Mood normal.        Behavior: Behavior normal.      UC Treatments / Results  Labs (all labs ordered are listed, but only abnormal results are displayed) Labs Reviewed - No data to display  EKG   Radiology No  results found.  Procedures Procedures (including critical care time)  Medications Ordered in UC Medications - No data to display  Initial Impression / Assessment and Plan / UC Course  I have reviewed the triage vital signs and the nursing notes.  Pertinent labs & imaging results that were available during my care of the patient were reviewed by me and considered in my medical decision making (see chart for details).   Suspect worsening peripheral arterial disease affecting the right leg.  We discussed interventions to reduce swelling.  Mr. Mcmanamon is hoping for a medication however I do not believe one is appropriate.  Already using Jardiance and chlorthalidone.  Potassium is low at baseline.  Recommended compression stocking.  Follow-up with your primary care provider.  Final Clinical Impressions(s) / UC Diagnoses   Final diagnoses:  Edema of right lower extremity  Dependent rubor  Peripheral arterial disease [facility])     Discharge Instructions      Follow up with your primary care provider.    ED Prescriptions   None    PDMP not reviewed this encounter.   Rose Phi, [place] 09/08/22 1943

## 2022-09-08 NOTE — Discharge Instructions (Signed)
Follow up with your primary care provider.

## 2022-09-22 ENCOUNTER — Ambulatory Visit: Payer: Medicare Other

## 2022-09-22 ENCOUNTER — Ambulatory Visit
Admission: RE | Admit: 2022-09-22 | Discharge: 2022-09-22 | Disposition: A | Discharge status: Home / Self Care | Payer: Medicare Other | Source: Ambulatory Visit | Attending: Family Medicine | Admitting: Family Medicine

## 2022-09-22 ENCOUNTER — Encounter: Payer: Self-pay | Admitting: Family Medicine

## 2022-09-22 ENCOUNTER — Other Ambulatory Visit
Admission: RE | Admit: 2022-09-22 | Discharge: 2022-09-22 | Disposition: A | Discharge status: Home / Self Care | Payer: Medicare Other | Source: Ambulatory Visit | Attending: Family Medicine | Admitting: Family Medicine

## 2022-09-22 ENCOUNTER — Ambulatory Visit (INDEPENDENT_AMBULATORY_CARE_PROVIDER_SITE_OTHER): Payer: Medicare Other | Admitting: Family Medicine

## 2022-09-22 VITALS — BP 108/60 | HR 83 | Temp 98.1°F | Ht 68.0 in

## 2022-09-22 DIAGNOSIS — M7989 Other specified soft tissue disorders: Secondary | ICD-10-CM | POA: Diagnosis not present

## 2022-09-22 DIAGNOSIS — E1140 Type 2 diabetes mellitus with diabetic neuropathy, unspecified: Secondary | ICD-10-CM | POA: Diagnosis not present

## 2022-09-22 DIAGNOSIS — S46811A Strain of other muscles, fascia and tendons at shoulder and upper arm level, right arm, initial encounter: Secondary | ICD-10-CM | POA: Diagnosis not present

## 2022-09-22 DIAGNOSIS — S46819A Strain of other muscles, fascia and tendons at shoulder and upper arm level, unspecified arm, initial encounter: Secondary | ICD-10-CM | POA: Insufficient documentation

## 2022-09-22 DIAGNOSIS — N478 Other disorders of prepuce: Secondary | ICD-10-CM

## 2022-09-22 DIAGNOSIS — M79661 Pain in right lower leg: Secondary | ICD-10-CM | POA: Diagnosis not present

## 2022-09-22 DIAGNOSIS — Z794 Long term (current) use of insulin: Secondary | ICD-10-CM | POA: Insufficient documentation

## 2022-09-22 LAB — HEMOGLOBIN A1C
Hgb A1c MFr Bld: 7.4 % — ABNORMAL HIGH (ref 4.8–5.6)
Mean Plasma Glucose: 165.68 mg/dL

## 2022-09-22 LAB — BASIC METABOLIC PANEL
Anion gap: 12 (ref 5–15)
BUN: 28 mg/dL — ABNORMAL HIGH (ref 8–23)
CO2: 23 mmol/L (ref 22–32)
Calcium: 9.1 mg/dL (ref 8.9–10.3)
Chloride: 103 mmol/L (ref 98–111)
Creatinine, Ser: 0.9 mg/dL (ref 0.61–1.24)
GFR, Estimated: >60 mL/min (ref >60)
Glucose, Bld: 139 mg/dL — ABNORMAL HIGH (ref 70–99)
Potassium: 3.4 mmol/L — ABNORMAL LOW (ref 3.5–5.1)
Sodium: 138 mmol/L (ref 135–145)

## 2022-09-22 MED ORDER — TRIAMCINOLONE ACETONIDE 0.1 % EX CREA: TOPICAL_CREAM | CUTANEOUS | 0 refills | Status: DC

## 2022-09-22 NOTE — Assessment & Plan Note (Signed)
Patient with intermittent issues with rash and tightness of his foreskin.  This responds well to triamcinolone.  I did provide a refill though we had a discussion about stopping Jardiance as well.

## 2022-09-22 NOTE — Progress Notes (Signed)
Patrick Rumps, MD Phone: [phone]  Patrick Patel is a 81 y.o. male who presents today for follow-up.  Neck/shoulder pain: Patient notes he has had some right trapezius discomfort for some time now.  It has gotten a little worse.  He thinks he is having some muscle fatigue from having to push up on his walker and push up on chairs to get up out of them.  He notes no radiation.  No numbness.  He has chronic neuropathy symptoms in his bilateral hands.  Right lower leg swelling: Patient notes this has been going on for at least 3 weeks.  There has been swelling and discoloration distally.  He notes the discoloration is chronic and unchanged.  He notes shooting pains in the calf of his right leg.  He tried compression stockings and that did help though he had to cut them off.  He did have a bilateral lower extremity ultrasound back in July though his legs were not swelling at that time.  Diabetes: Sugars have been 140-210.  He is on NPH, glipizide, Jardiance, and metformin.  Foreskin issue: Patient notes once or twice a month he ends up with some irritation to his foreskin that he has to use triamcinolone on.  He notes this is not currently bothering him.  He notes this has been bothering him for years and years intermittently though has become more frequent since he started on Jardiance.  He is hesitant to discontinue the Jardiance since it works so well for his diabetes.  Social History   Tobacco Use  Smoking Status Former  Smokeless Tobacco Current   Types: Snuff    Current Outpatient Medications on File Prior to Visit  Medication Sig Dispense Refill   ACCU-CHEK AVIVA PLUS test strip USE TO CHECK BLOOD GLUCOSE  3 TIMES DAILY 300 strip 2   Accu-Chek Softclix Lancets lancets Use as instructedUSE TO CHECK BLOOD GLUCOSE 3 TIMES DAILY 300 each 2   acetaminophen (TYLENOL) 500 MG tablet Take 1,000 mg by mouth 2 (two) times daily at 10 AM and 5 PM.      alfuzosin (UROXATRAL) 10 MG 24 hr  tablet TAKE 1 TABLET BY MOUTH DAILY  WITH BREAKFAST 100 tablet 2   aspirin EC 325 MG tablet Take 650 mg by mouth daily.      blood glucose meter kit and supplies KIT Dispense Accu-Chek Aviva plus. Check once daily as directed. For ICD 10 code E11.9. 1 each 0   Calcium-Magnesium-Zinc 167-83-8 MG TABS Take 4 tablets by mouth daily.     chlorthalidone (HYGROTON) 50 MG tablet TAKE 1 TABLET BY MOUTH DAILY 100 tablet 2   Continuous Blood Gluc Receiver (FREESTYLE LIBRE 2 READER) DEVI Use at least every 8 hours to read your blood glucose 1 each 0   Continuous Blood Gluc Sensor (FREESTYLE LIBRE 14 DAY SENSOR) MISC Apply every 14 days, used to check glucose at least 3 times a day, diagnosis code E11.9 6 each 1   Cranberry 125 MG TABS Take 4 tablets by mouth daily.     diphenhydrAMINE (BENADRYL) 25 MG tablet Take 50 mg by mouth at bedtime as needed for sleep.     Dulaglutide (TRULICITY) 8.25 OI/3.7CW SOPN Inject 0.75 mg into the skin once a week. 6 mL 1   famotidine (PEPCID) 20 MG tablet TAKE 1 TABLET BY MOUTH AT  BEDTIME 100 tablet 2   glipiZIDE (GLUCOTROL) 10 MG tablet Take 2 tablets (20 mg total) by mouth 2 (two) times daily. [place]  tablet 3   insulin NPH Human (HUMULIN N) 100 UNIT/ML injection INJECT SUBCUTANEOUSLY 20  UNITS IN THE MORNING AND 15 UNITS AT BEDTIME 40 mL 3   Insulin Syringe-Needle U-100 (BD INSULIN SYRINGE U/F) 31G X 5/16" 0.3 ML MISC USE TWICE DAILY WITH  INSULIN 180 each 3   JARDIANCE 25 MG TABS tablet TAKE 1 TABLET BY MOUTH  DAILY 100 tablet 2   meclizine (ANTIVERT) 12.5 MG tablet Take 1 tablet (12.5 mg total) by mouth 3 (three) times daily as needed for dizziness (Ataxia and vertigo). 20 tablet 1   meloxicam (MOBIC) 7.5 MG tablet Take 1 tablet (7.5 mg total) by mouth daily. 90 tablet 0   metFORMIN (GLUCOPHAGE-XR) 500 MG 24 hr tablet TAKE 4 TABLETS BY MOUTH  ONCE DAILY WITH BREAKFAST 360 tablet 3   Multiple Vitamins-Minerals (MULTIVITAMIN WITH MINERALS) tablet Take 2 tablets by mouth daily.      Omega-3 Fatty Acids (FISH OIL) 1000 MG CPDR Take 4 capsules by mouth every other day.     OZEMPIC, 0.25 OR 0.5 MG/DOSE, 2 MG/3ML SOPN Inject into the skin.     rosuvastatin (CRESTOR) 20 MG tablet TAKE 1 TABLET BY MOUTH DAILY 70 tablet 4   Vitamin D, Ergocalciferol, (DRISDOL) 1.25 MG (50000 UNIT) CAPS capsule Take 1 capsule (50,000 Units total) by mouth every 7 (seven) days. 8 capsule 0   No current facility-administered medications on file prior to visit.     ROS see history of present illness  Objective  Physical Exam Vitals:   09/22/22 1600  BP: 108/60  Pulse: 83  Temp: 98.1 F (36.7 C)  SpO2: 95%    BP Readings from Last 3 Encounters:  09/22/22 108/60  09/08/22 124/71  06/29/22 (!) 141/62   Wt Readings from Last 3 Encounters:  07/05/22 247 lb (112 kg)  06/24/22 200 lb (90.7 kg)  06/21/22 247 lb 9.6 oz (112.3 kg)    Physical Exam Constitutional:      General: He is not in acute distress.    Appearance: He is not diaphoretic.  Cardiovascular:     Rate and Rhythm: Normal rate and regular rhythm.     Heart sounds: Normal heart sounds.  Pulmonary:     Effort: Pulmonary effort is normal.     Breath sounds: Normal breath sounds.  Musculoskeletal:     Comments: Right lower extremity is noticeably larger than the left lower extremity.  There is tenseness to the skin in his right calf, there is tenderness on palpation of his right calf  Skin:    General: Skin is warm and dry.  Neurological:     Mental Status: He is alert.      Assessment/Plan: Please see individual problem list.  Problem List Items Addressed This Visit     Type 2 diabetes mellitus with diabetic neuropathy, unspecified (HCC) (Chronic)    Check A1c.  He will continue metformin XR 2000 mg daily, NPH 40 units in the morning and 15 units at bedtime, glipizide 20 mg twice daily, and for now he will continue Jardiance 25 mg daily.  We will see what his A1c is.  I did have a long discussion with him  regarding stopping the Jardiance given his foreskin issues.  He is very hesitant to do this.  I discussed the risk of phimosis and the need for circumcision if he continues to have inflammatory issues with his foreskin.      Relevant Orders   HgB D9M   Basic Metabolic  Panel (BMET)   Foreskin problem    Patient with intermittent issues with rash and tightness of his foreskin.  This responds well to triamcinolone.  I did provide a refill though we had a discussion about stopping Jardiance as well.      Pain and swelling of right lower leg - Primary    Concerning for DVT.  He will have a stat ultrasound to evaluate for this.  Advised if is not related to a DVT he likely has venous insufficiency.      Relevant Orders   US Venous Img Lower Unilateral Right   US Venous Img Lower Unilateral Right   Trapezius strain    Suspect trapezius spasm or trapezius strain as the cause of his symptoms in his neck.  Consider physical therapy in the future if not improving.        Return in about 3 months (around 12/23/2022).  I have spent 32 minutes in the care of this patient regarding history taking, documentation, completion of exam, discussion of plan, placing orders, communicating with referral coordinator to get [place] scheduled.   Patrick Rumps, MD [place]

## 2022-09-22 NOTE — Assessment & Plan Note (Signed)
Suspect trapezius spasm or trapezius strain as the cause of his symptoms in his neck.  Consider physical therapy in the future if not improving.

## 2022-09-22 NOTE — Assessment & Plan Note (Signed)
Concerning for DVT.  He will have a stat ultrasound to evaluate for this.  Advised if is not related to a DVT he likely has venous insufficiency.

## 2022-09-22 NOTE — Assessment & Plan Note (Addendum)
Check A1c.  He will continue metformin XR 2000 mg daily, NPH 40 units in the morning and 15 units at bedtime, glipizide 20 mg twice daily, and for now he will continue Jardiance 25 mg daily.  We will see what his A1c is.  I did have a long discussion with him regarding stopping the Jardiance given his foreskin issues.  He is very hesitant to do this.  I discussed the risk of phimosis and the need for circumcision if he continues to have inflammatory issues with his foreskin.

## 2022-09-25 ENCOUNTER — Telehealth: Payer: Self-pay

## 2022-09-25 NOTE — Telephone Encounter (Signed)
Patient states he is returning a call from F[place]on Mole, Roby.  I was unable to reach Paden at the time of the call.  I let patient know that I will send her a message and have her call him back.

## 2022-09-25 NOTE — Telephone Encounter (Signed)
Called and spoke with patient see results note.  Zophia Marrone,cma

## 2022-09-26 ENCOUNTER — Other Ambulatory Visit: Payer: Self-pay | Admitting: Family Medicine

## 2022-09-26 DIAGNOSIS — M7121 Synovial cyst of popliteal space [Baker], right knee: Secondary | ICD-10-CM

## 2022-10-04 DIAGNOSIS — M1711 Unilateral primary osteoarthritis, right knee: Secondary | ICD-10-CM | POA: Diagnosis not present

## 2022-10-04 DIAGNOSIS — R2241 Localized swelling, mass and lump, right lower limb: Secondary | ICD-10-CM | POA: Diagnosis not present

## 2022-11-14 ENCOUNTER — Other Ambulatory Visit (INDEPENDENT_AMBULATORY_CARE_PROVIDER_SITE_OTHER): Payer: Self-pay | Admitting: Nurse Practitioner

## 2022-11-14 DIAGNOSIS — R2241 Localized swelling, mass and lump, right lower limb: Secondary | ICD-10-CM

## 2022-11-15 ENCOUNTER — Ambulatory Visit (INDEPENDENT_AMBULATORY_CARE_PROVIDER_SITE_OTHER): Payer: Medicare Other

## 2022-11-15 ENCOUNTER — Ambulatory Visit (INDEPENDENT_AMBULATORY_CARE_PROVIDER_SITE_OTHER): Payer: Medicare Other | Admitting: Nurse Practitioner

## 2022-11-15 ENCOUNTER — Encounter (INDEPENDENT_AMBULATORY_CARE_PROVIDER_SITE_OTHER): Payer: Self-pay | Admitting: Nurse Practitioner

## 2022-11-15 DIAGNOSIS — R2241 Localized swelling, mass and lump, right lower limb: Secondary | ICD-10-CM | POA: Diagnosis not present

## 2022-11-15 MED ORDER — EUCERIN EX LOTN: TOPICAL_LOTION | CUTANEOUS | 3 refills | Status: AC | PRN

## 2022-11-15 MED ORDER — DOXYCYCLINE HYCLATE 100 MG PO CAPS: 100.0000 mg | ORAL_CAPSULE | Freq: Two times a day (BID) | ORAL | 0 refills | Status: DC

## 2022-11-16 ENCOUNTER — Encounter (INDEPENDENT_AMBULATORY_CARE_PROVIDER_SITE_OTHER): Payer: Medicare Other

## 2022-11-16 ENCOUNTER — Encounter (INDEPENDENT_AMBULATORY_CARE_PROVIDER_SITE_OTHER): Payer: Medicare Other | Admitting: Vascular Surgery

## 2022-11-27 ENCOUNTER — Encounter (INDEPENDENT_AMBULATORY_CARE_PROVIDER_SITE_OTHER): Payer: Self-pay | Admitting: Nurse Practitioner

## 2022-11-27 NOTE — Progress Notes (Signed)
Subjective:    Patient ID: Patrick Patel, male    DOB: December 23, 1940, 81 y.o.   MRN: [id number] Chief Complaint  Patient presents with   New Patient (Initial Visit)    Ref Harlow Mares consult right knee osteoarthritis, localized swelling    Patrick Patel is an 81 year old male who presents today as a referral from Dr. Harlow Mares in regards to lower extremity edema and some localized swelling.  The patient notes that the area of swelling is largely contained to the right lower extremity.  There is an area that is more tender and swollen.  The patient also has generalized redness and discomfort.  He also currently has a history of known knee problems and issues.  Currently there are no open wounds or ulcerations.  Today noninvasive studies show no evidence of DVT or superficial phlebitis in the right lower extremity.  No evidence of deep venous insufficiency or superficial venous reflux.  Hematoma with no flow present.  Seen in the mid calf laterally.    Review of Systems  Cardiovascular:  Positive for leg swelling.  All other systems reviewed and are negative.      Objective:   Physical Exam Vitals reviewed.  HENT:     Head: Normocephalic.  Cardiovascular:     Rate and Rhythm: Normal rate.  Pulmonary:     Effort: Pulmonary effort is normal.  Musculoskeletal:     Right lower leg: Edema present.     Left lower leg: Edema present.  Skin:    General: Skin is warm and dry.  Neurological:     Mental Status: He is alert and oriented to person, place, and time.  Psychiatric:        Mood and Affect: Mood normal.        Behavior: Behavior normal.        Thought Content: Thought content normal.        Judgment: Judgment normal.     BP (!) 141/70 (BP Location: Left Arm)   Pulse 80   Resp 16   Wt 250 lb (113.4 kg)   BMI 38.01 kg/m   Past Medical History:  Diagnosis Date   Diabetes ([place])    Hyperlipidemia    Hypertension     Social History   Socioeconomic History   Marital  status: Single    Spouse name: Not on file   Number of children: Not on file   Years of education: Not on file   Highest education level: Not on file  Occupational History   Not on file  Tobacco Use   Smoking status: Former   Smokeless tobacco: Current    Types: Snuff  Substance and Sexual Activity   Alcohol use: No    Alcohol/week: 0.0 standard drinks of alcohol   Drug use: No   Sexual activity: Not on file  Other Topics Concern   Not on file  Social History Narrative   Not on file   Social Determinants of Health   Financial Resource Strain: Not on file  Food Insecurity: Not on file  Transportation Needs: Not on file  Physical Activity: Not on file  Stress: Not on file  Social Connections: Not on file  Intimate Partner Violence: Not on file    No past surgical history on file.  Family History  Problem Relation Age of Onset   Breast cancer Mother    Thyroid cancer Mother     Allergies  Allergen Reactions   Bactrim [Sulfamethoxazole-Trimethoprim] Swelling and Rash  Pioglitazone Swelling   Rosiglitazone Other (See Comments)    Ankle swelling       Latest Ref Rng & Units 06/29/2022    3:58 AM 06/28/2022    5:14 AM 06/26/2022    3:42 AM  CBC  WBC 4.0 - 10.5 K/uL 8.4  8.7    Hemoglobin 13.0 - 17.0 g/dL 14.7  14.1  14.8   Hematocrit 39.0 - 52.0 % 42.5  42.7  43.8   Platelets 150 - 400 K/uL 190  182        CMP     Component Value Date/Time   NA 138 09/22/2022 1716   NA 141 05/19/2022 1523   K 3.4 (L) 09/22/2022 1716   CL 103 09/22/2022 1716   CO2 23 09/22/2022 1716   GLUCOSE 139 (H) 09/22/2022 1716   BUN 28 (H) 09/22/2022 1716   BUN 18 05/19/2022 1523   CREATININE 0.90 09/22/2022 1716   CREATININE 1.04 02/03/2022 1637   CALCIUM 9.1 09/22/2022 1716   PROT 7.2 06/24/2022 1117   PROT 7.0 05/19/2022 1523   ALBUMIN 3.6 06/24/2022 1117   ALBUMIN 4.3 05/19/2022 1523   AST 19 06/24/2022 1117   ALT 15 06/24/2022 1117   ALKPHOS 71 06/24/2022 1117    BILITOT 0.9 06/24/2022 1117   BILITOT 0.3 05/19/2022 1523   GFRNONAA >60 09/22/2022 1716   GFRAA >60 12/19/2018 0539     No results found.     Assessment & Plan:   1. Localized swelling, mass and lump, right lower limb Today the patient's swelling is found not to have any evidence of DVT or superficial thrombophlebitis.  He does note to have a cystic structure in the right calf estimated to be a hematoma.  He also has some redness suspicious for cellulitis.  We will prescribe antibiotics as well as instruct the patient to begin utilizing medical grade compression.  We have also prescribed some Eucerin lotion for him to utilize to help with dryness as well.  Will have her return in 2 to 3 weeks. - VAS [place] LOWER EXTREMITY VENOUS REFLUX   Current Outpatient Medications on File Prior to Visit  Medication Sig Dispense Refill   ACCU-CHEK AVIVA PLUS test strip USE TO CHECK BLOOD GLUCOSE  3 TIMES DAILY 300 strip 2   Accu-Chek Softclix Lancets lancets Use as instructedUSE TO CHECK BLOOD GLUCOSE 3 TIMES DAILY 300 each 2   acetaminophen (TYLENOL) 500 MG tablet Take 1,000 mg by mouth 2 (two) times daily at 10 AM and 5 PM.      alfuzosin (UROXATRAL) 10 MG 24 hr tablet TAKE 1 TABLET BY MOUTH DAILY  WITH BREAKFAST 100 tablet 2   blood glucose meter kit and supplies KIT Dispense Accu-Chek Aviva plus. Check once daily as directed. For ICD 10 code E11.9. 1 each 0   Calcium-Magnesium-Zinc 167-83-8 MG TABS Take 4 tablets by mouth daily.     chlorthalidone (HYGROTON) 50 MG tablet TAKE 1 TABLET BY MOUTH DAILY 100 tablet 2   Continuous Blood Gluc Receiver (FREESTYLE LIBRE 2 READER) DEVI Use at least every 8 hours to read your blood glucose 1 each 0   Continuous Blood Gluc Sensor (FREESTYLE LIBRE 14 DAY SENSOR) MISC Apply every 14 days, used to check glucose at least 3 times a day, diagnosis code E11.9 6 each 1   Cranberry 125 MG TABS Take 4 tablets by mouth daily.     diphenhydrAMINE (BENADRYL) 25 MG tablet  Take 50 mg by mouth at bedtime  as needed for sleep.     famotidine (PEPCID) 20 MG tablet TAKE 1 TABLET BY MOUTH AT  BEDTIME 100 tablet 2   glipiZIDE (GLUCOTROL) 10 MG tablet Take 2 tablets (20 mg total) by mouth 2 (two) times daily. 360 tablet 3   insulin NPH Human (HUMULIN N) 100 UNIT/ML injection INJECT SUBCUTANEOUSLY 20  UNITS IN THE MORNING AND 15 UNITS AT BEDTIME 40 mL 3   Insulin Syringe-Needle U-100 (BD INSULIN SYRINGE U/F) 31G X 5/16" 0.3 ML MISC USE TWICE DAILY WITH  INSULIN 180 each 3   JARDIANCE 25 MG TABS tablet TAKE 1 TABLET BY MOUTH  DAILY 100 tablet 2   meclizine (ANTIVERT) 12.5 MG tablet Take 1 tablet (12.5 mg total) by mouth 3 (three) times daily as needed for dizziness (Ataxia and vertigo). 20 tablet 1   meloxicam (MOBIC) 7.5 MG tablet Take 1 tablet (7.5 mg total) by mouth daily. 90 tablet 0   metFORMIN (GLUCOPHAGE-XR) 500 MG 24 hr tablet TAKE 4 TABLETS BY MOUTH  ONCE DAILY WITH BREAKFAST 360 tablet 3   Multiple Vitamins-Minerals (MULTIVITAMIN WITH MINERALS) tablet Take 2 tablets by mouth daily.     Omega-3 Fatty Acids (FISH OIL) 1000 MG CPDR Take 4 capsules by mouth every other day.     rosuvastatin (CRESTOR) 20 MG tablet TAKE 1 TABLET BY MOUTH DAILY 70 tablet 4   triamcinolone cream (KENALOG) 0.1 % APPLY CREAM EXTERNALLY 2  TIMES DAILY AS NEEDED 45 g 0   Vitamin D, Ergocalciferol, (DRISDOL) 1.25 MG (50000 UNIT) CAPS capsule Take 1 capsule (50,000 Units total) by mouth every 7 (seven) days. 8 capsule 0   aspirin EC 325 MG tablet Take 650 mg by mouth daily.  (Patient not taking: Reported on 11/15/2022)     Dulaglutide (TRULICITY) 1.57 WI/2.0BT SOPN Inject 0.75 mg into the skin once a week. (Patient not taking: Reported on 11/15/2022) 6 mL 1   OZEMPIC, 0.25 OR 0.5 MG/DOSE, 2 MG/3ML SOPN Inject into the skin. (Patient not taking: Reported on 11/15/2022)     No current facility-administered medications on file prior to visit.    There are no Patient Instructions on file for this  visit. No follow-ups on file.   Kris Hartmann, NP

## 2022-11-28 ENCOUNTER — Ambulatory Visit (INDEPENDENT_AMBULATORY_CARE_PROVIDER_SITE_OTHER): Payer: Medicare Other | Admitting: Nurse Practitioner

## 2022-12-07 ENCOUNTER — Other Ambulatory Visit: Payer: Self-pay | Admitting: Family Medicine

## 2022-12-07 DIAGNOSIS — E1140 Type 2 diabetes mellitus with diabetic neuropathy, unspecified: Secondary | ICD-10-CM

## 2022-12-12 ENCOUNTER — Encounter (INDEPENDENT_AMBULATORY_CARE_PROVIDER_SITE_OTHER): Payer: Self-pay | Admitting: Nurse Practitioner

## 2022-12-12 ENCOUNTER — Ambulatory Visit (INDEPENDENT_AMBULATORY_CARE_PROVIDER_SITE_OTHER): Payer: 59 | Admitting: Nurse Practitioner

## 2022-12-12 VITALS — BP 117/65 | HR 86 | Resp 16 | Wt 248.0 lb

## 2022-12-12 DIAGNOSIS — I10 Essential (primary) hypertension: Secondary | ICD-10-CM | POA: Diagnosis not present

## 2022-12-12 DIAGNOSIS — R2241 Localized swelling, mass and lump, right lower limb: Secondary | ICD-10-CM | POA: Diagnosis not present

## 2022-12-12 MED ORDER — DOXYCYCLINE HYCLATE 100 MG PO CAPS: 100.0000 mg | ORAL_CAPSULE | Freq: Two times a day (BID) | ORAL | 0 refills | Status: DC

## 2022-12-13 ENCOUNTER — Encounter (INDEPENDENT_AMBULATORY_CARE_PROVIDER_SITE_OTHER): Payer: Self-pay | Admitting: Nurse Practitioner

## 2022-12-13 NOTE — Progress Notes (Signed)
Subjective:    Patient ID: Patrick Patel, male    DOB: 01/18/1941, 82 y.o.   MRN: [id number] Chief Complaint  Patient presents with   Follow-up    2-3 week follow up    Patrick Patel is an 82 year old male who returns today for evaluation of swelling and redness and discomfort in his right lower extremity.  Previous study showed no evidence of DVT as well as no evidence of deep venous insufficiency or superficial venous reflux.  However it was noted that there was a hematoma present.  We discussed conservative therapy.  The patient has been wearing a modified sort of compression garment.  Essentially the top portion of the compression sock is present but the bottom part has been cut to allow for ease of use.  The calf area swelling has decreased but below the area that has no compression as redness and is still fairly taut.  He notes that the pain that he had previously is also decreasing with the treatment in size.  He continues to not have any significant open wounds or ulcerations.  He notes that the previous redness he had improved with his doxycycline but it subsequently returned.     Review of Systems  Cardiovascular:  Positive for leg swelling.  Skin:  Positive for color change.  Neurological:  Positive for weakness.  All other systems reviewed and are negative.      Objective:   Physical Exam Vitals reviewed.  HENT:     Head: Normocephalic.  Cardiovascular:     Rate and Rhythm: Normal rate.  Pulmonary:     Effort: Pulmonary effort is normal.  Skin:    Findings: Erythema present.  Neurological:     Mental Status: He is alert and oriented to person, place, and time.     Gait: Gait abnormal.  Psychiatric:        Mood and Affect: Mood normal.        Behavior: Behavior normal.        Thought Content: Thought content normal.        Judgment: Judgment normal.     BP 117/65 (BP Location: Left Arm)   Pulse 86   Resp 16   Wt 248 lb (112.5 kg)   BMI 37.71 kg/m    Past Medical History:  Diagnosis Date   Diabetes ([place])    Hyperlipidemia    Hypertension     Social History   Socioeconomic History   Marital status: Single    Spouse name: Not on file   Number of children: Not on file   Years of education: Not on file   Highest education level: Not on file  Occupational History   Not on file  Tobacco Use   Smoking status: Former   Smokeless tobacco: Current    Types: Snuff  Substance and Sexual Activity   Alcohol use: No    Alcohol/week: 0.0 standard drinks of alcohol   Drug use: No   Sexual activity: Not on file  Other Topics Concern   Not on file  Social History Narrative   Not on file   Social Determinants of Health   Financial Resource Strain: Not on file  Food Insecurity: Not on file  Transportation Needs: Not on file  Physical Activity: Not on file  Stress: Not on file  Social Connections: Not on file  Intimate Partner Violence: Not on file    History reviewed. No pertinent surgical history.  Family History  Problem Relation Age  of Onset   Breast cancer Mother    Thyroid cancer Mother     Allergies  Allergen Reactions   Bactrim [Sulfamethoxazole-Trimethoprim] Swelling and Rash   Pioglitazone Swelling   Rosiglitazone Other (See Comments)    Ankle swelling       Latest Ref Rng & Units 06/29/2022    3:58 AM 06/28/2022    5:14 AM 06/26/2022    3:42 AM  CBC  WBC 4.0 - 10.5 K/uL 8.4  8.7    Hemoglobin 13.0 - 17.0 g/dL 14.7  14.1  14.8   Hematocrit 39.0 - 52.0 % 42.5  42.7  43.8   Platelets 150 - 400 K/uL 190  182        CMP     Component Value Date/Time   NA 138 09/22/2022 1716   NA 141 05/19/2022 1523   K 3.4 (L) 09/22/2022 1716   CL 103 09/22/2022 1716   CO2 23 09/22/2022 1716   GLUCOSE 139 (H) 09/22/2022 1716   BUN 28 (H) 09/22/2022 1716   BUN 18 05/19/2022 1523   CREATININE 0.90 09/22/2022 1716   CREATININE 1.04 02/03/2022 1637   CALCIUM 9.1 09/22/2022 1716   PROT 7.2 06/24/2022 1117   PROT  7.0 05/19/2022 1523   ALBUMIN 3.6 06/24/2022 1117   ALBUMIN 4.3 05/19/2022 1523   AST 19 06/24/2022 1117   ALT 15 06/24/2022 1117   ALKPHOS 71 06/24/2022 1117   BILITOT 0.9 06/24/2022 1117   BILITOT 0.3 05/19/2022 1523   GFRNONAA >60 09/22/2022 1716   GFRAA >60 12/19/2018 0539     No results found.     Assessment & Plan:   1. Localized swelling, mass and lump, right lower limb Will refill the patient's doxycycline as he notes he had some improvement in the redness of the lower extremity when he initially uses but it has subsequently returned.  I suspect a large part of the patient's problem is that he may have some underlying lymphedema.  While the swelling is improved from when he was previously seen, he is not completely resolved.  He does have evidence of cellulitis as well as hyperpigmentation and dermal thickening of the skin.  The patient was once again advised to begin to utilize medical grade compression stockings of the entire lower extremity.  A prescription was provided again for the patient.  He was also advised that when he is not active elevation will also be very helpful with relieving the symptoms of swelling as well as healing of the lower extremity.  I suspect the lump area was a hematoma as noted on previous ultrasound is resolving as he notes that the pain and discomfort is decreasing.  Will have the patient return in about 6 to 8 weeks to evaluate his progress with conservative therapy. - doxycycline (VIBRAMYCIN) 100 MG capsule; Take 1 capsule (100 mg total) by mouth 2 (two) times daily.  Dispense: 20 capsule; Refill: 0  2. Essential hypertension Continue antihypertensive medications as already ordered, these medications have been reviewed and there are no changes at this time.   Current Outpatient Medications on File Prior to Visit  Medication Sig Dispense Refill   ACCU-CHEK AVIVA PLUS test strip USE TO CHECK BLOOD GLUCOSE  3 TIMES DAILY 300 strip 2   Accu-Chek  Softclix Lancets lancets Use as instructedUSE TO CHECK BLOOD GLUCOSE 3 TIMES DAILY 300 each 2   acetaminophen (TYLENOL) 500 MG tablet Take 1,000 mg by mouth 2 (two) times daily at 10 AM and  5 PM.      alfuzosin (UROXATRAL) 10 MG 24 hr tablet TAKE 1 TABLET BY MOUTH DAILY  WITH BREAKFAST 100 tablet 2   blood glucose meter kit and supplies KIT Dispense Accu-Chek Aviva plus. Check once daily as directed. For ICD 10 code E11.9. 1 each 0   Calcium-Magnesium-Zinc 167-83-8 MG TABS Take 4 tablets by mouth daily.     chlorthalidone (HYGROTON) 50 MG tablet TAKE 1 TABLET BY MOUTH DAILY 100 tablet 2   Continuous Blood Gluc Receiver (FREESTYLE LIBRE 2 READER) DEVI Use at least every 8 hours to read your blood glucose 1 each 0   Continuous Blood Gluc Sensor (FREESTYLE LIBRE 14 DAY SENSOR) MISC Apply every 14 days, used to check glucose at least 3 times a day, diagnosis code E11.9 6 each 1   Cranberry 125 MG TABS Take 4 tablets by mouth daily.     diphenhydrAMINE (BENADRYL) 25 MG tablet Take 50 mg by mouth at bedtime as needed for sleep.     Emollient (EUCERIN) lotion Apply topically as needed for dry skin. 480 mL 3   famotidine (PEPCID) 20 MG tablet TAKE 1 TABLET BY MOUTH AT  BEDTIME 100 tablet 2   glipiZIDE (GLUCOTROL) 10 MG tablet Take 2 tablets (20 mg total) by mouth 2 (two) times daily. 360 tablet 3   insulin NPH Human (HUMULIN N) 100 UNIT/ML injection INJECT SUBCUTANEOUSLY 20  UNITS IN THE MORNING AND 15 UNITS AT BEDTIME (Patient taking differently: INJECT SUBCUTANEOUSLY 2  UNITS IN THE MORNING AND 20 UNITS AT BEDTIME) 40 mL 3   Insulin Syringe-Needle U-100 (BD INSULIN SYRINGE U/F) 31G X 5/16" 0.3 ML MISC USE TWICE DAILY WITH  INSULIN 180 each 3   JARDIANCE 25 MG TABS tablet TAKE 1 TABLET BY MOUTH DAILY 100 tablet 2   meloxicam (MOBIC) 7.5 MG tablet Take 1 tablet (7.5 mg total) by mouth daily. 90 tablet 0   metFORMIN (GLUCOPHAGE-XR) 500 MG 24 hr tablet TAKE 4 TABLETS BY MOUTH  ONCE DAILY WITH BREAKFAST 360  tablet 3   Multiple Vitamins-Minerals (MULTIVITAMIN WITH MINERALS) tablet Take 2 tablets by mouth daily.     Omega-3 Fatty Acids (FISH OIL) 1000 MG CPDR Take 4 capsules by mouth every other day.     rosuvastatin (CRESTOR) 20 MG tablet TAKE 1 TABLET BY MOUTH DAILY 70 tablet 4   triamcinolone cream (KENALOG) 0.1 % APPLY TOPICALLY TO AFFECTED  AREA(S) TWICE DAILY AS NEEDED 45 g 0   Vitamin D, Ergocalciferol, (DRISDOL) 1.25 MG (50000 UNIT) CAPS capsule Take 1 capsule (50,000 Units total) by mouth every 7 (seven) days. 8 capsule 0   aspirin EC 325 MG tablet Take 650 mg by mouth daily.  (Patient not taking: Reported on 11/15/2022)     Dulaglutide (TRULICITY) 3.90 ZE/0.9QZ SOPN Inject 0.75 mg into the skin once a week. (Patient not taking: Reported on 11/15/2022) 6 mL 1   meclizine (ANTIVERT) 12.5 MG tablet Take 1 tablet (12.5 mg total) by mouth 3 (three) times daily as needed for dizziness (Ataxia and vertigo). (Patient not taking: Reported on 12/12/2022) 20 tablet 1   OZEMPIC, 0.25 OR 0.5 MG/DOSE, 2 MG/3ML SOPN Inject into the skin. (Patient not taking: Reported on 11/15/2022)     No current facility-administered medications on file prior to visit.    There are no Patient Instructions on file for this visit. No follow-ups on file.   Kris Hartmann, NP

## 2022-12-25 ENCOUNTER — Ambulatory Visit (INDEPENDENT_AMBULATORY_CARE_PROVIDER_SITE_OTHER): Payer: 59 | Admitting: Family Medicine

## 2022-12-25 ENCOUNTER — Other Ambulatory Visit: Payer: Self-pay

## 2022-12-25 ENCOUNTER — Other Ambulatory Visit: Payer: Self-pay | Admitting: Family Medicine

## 2022-12-25 VITALS — BP 118/70 | HR 92 | Temp 98.2°F | Wt 251.8 lb

## 2022-12-25 DIAGNOSIS — R0989 Other specified symptoms and signs involving the circulatory and respiratory systems: Secondary | ICD-10-CM

## 2022-12-25 DIAGNOSIS — M791 Myalgia, unspecified site: Secondary | ICD-10-CM

## 2022-12-25 DIAGNOSIS — I10 Essential (primary) hypertension: Secondary | ICD-10-CM | POA: Diagnosis not present

## 2022-12-25 DIAGNOSIS — Z125 Encounter for screening for malignant neoplasm of prostate: Secondary | ICD-10-CM | POA: Diagnosis not present

## 2022-12-25 DIAGNOSIS — Z794 Long term (current) use of insulin: Secondary | ICD-10-CM | POA: Diagnosis not present

## 2022-12-25 DIAGNOSIS — E1140 Type 2 diabetes mellitus with diabetic neuropathy, unspecified: Secondary | ICD-10-CM

## 2022-12-25 LAB — POCT GLYCOSYLATED HEMOGLOBIN (HGB A1C): Hemoglobin A1C: 8 % — AB (ref 4.0–5.6)

## 2022-12-25 MED ORDER — DICLOFENAC SODIUM 1 % EX GEL: 2.0000 g | Freq: Four times a day (QID) | CUTANEOUS | 0 refills | Status: DC | PRN

## 2022-12-25 MED ORDER — FREESTYLE LIBRE 14 DAY SENSOR MISC: 1 refills | Status: DC

## 2022-12-25 MED ORDER — FREESTYLE LIBRE 2 READER DEVI: 0 refills | Status: DC

## 2022-12-25 MED ORDER — INSULIN NPH (HUMAN) (ISOPHANE) 100 UNIT/ML ~~LOC~~ SUSP: SUBCUTANEOUS | 3 refills | Status: DC

## 2022-12-25 NOTE — Patient Instructions (Signed)
Please reduce your sugar intake.

## 2022-12-25 NOTE — Assessment & Plan Note (Signed)
Difficult to palpate pedal pulses.  He reports vascular surgery saw him recently and felt his pulses adequately.  I did discuss getting ABIs though he is hesitant to go to the hospital for this unless he really needs to.  I will send my note to his vascular surgery provider to see if they just want to do this when he comes to see them.

## 2022-12-25 NOTE — Assessment & Plan Note (Signed)
Focal.  Most likely related to using his walker and having muscle overuse with this.  He can trial Voltaren gel.  This will be sent to his pharmacy.  He is hesitant to try any oral NSAIDs given risk of dizziness.

## 2022-12-25 NOTE — Progress Notes (Signed)
Marikay Alar, MD Phone: [phone]  Patrick Patel is a 82 y.o. male who presents today for f/u.  DIABETES Disease Monitoring: Blood Sugar ranges-142-190 Polyuria/phagia/dipsia- craving sugar      Optho- due Medications: Compliance- taking glipizide, NPH 25 u am and 20 u pm, metformin, jardiance Hypoglycemic symptoms- no  HYPERTENSION Disease Monitoring Home BP Monitoring 130s systolic Chest pain- no    Dyspnea- no Medications Compliance-  taking chlorthalidone. BMET    Component Value Date/Time   NA 138 09/22/2022 1716   NA 141 05/19/2022 1523   K 3.4 (L) 09/22/2022 1716   CL 103 09/22/2022 1716   CO2 23 09/22/2022 1716   GLUCOSE 139 (H) 09/22/2022 1716   BUN 28 (H) 09/22/2022 1716   BUN 18 05/19/2022 1523   CREATININE 0.90 09/22/2022 1716   CREATININE 1.04 02/03/2022 1637   CALCIUM 9.1 09/22/2022 1716   GFRNONAA >60 09/22/2022 1716   GFRAA >60 12/19/2018 0539   Muscular pain: Patient reports muscular discomfort in his right trapezius muscle, neck muscles, right pectoral muscle, and right deltoid muscle.  He feels as though this is related to using a walker to get around.  Notes to be sitting there does not really bother him though if he gets up and uses those muscles they start to become painful.   Social History   Tobacco Use  Smoking Status Former  Smokeless Tobacco Current   Types: Snuff    Current Outpatient Medications on File Prior to Visit  Medication Sig Dispense Refill   ACCU-CHEK AVIVA PLUS test strip USE TO CHECK BLOOD GLUCOSE  3 TIMES DAILY 300 strip 2   Accu-Chek Softclix Lancets lancets Use as instructedUSE TO CHECK BLOOD GLUCOSE 3 TIMES DAILY 300 each 2   acetaminophen (TYLENOL) 500 MG tablet Take 1,000 mg by mouth 2 (two) times daily at 10 AM and 5 PM.      alfuzosin (UROXATRAL) 10 MG 24 hr tablet TAKE 1 TABLET BY MOUTH DAILY  WITH BREAKFAST 100 tablet 2   blood glucose meter kit and supplies KIT Dispense Accu-Chek Aviva plus. Check once  daily as directed. For ICD 10 code E11.9. 1 each 0   Calcium-Magnesium-Zinc 167-83-8 MG TABS Take 4 tablets by mouth daily.     chlorthalidone (HYGROTON) 50 MG tablet TAKE 1 TABLET BY MOUTH DAILY 100 tablet 2   diphenhydrAMINE (BENADRYL) 25 MG tablet Take 50 mg by mouth at bedtime as needed for sleep.     Emollient (EUCERIN) lotion Apply topically as needed for dry skin. 480 mL 3   famotidine (PEPCID) 20 MG tablet TAKE 1 TABLET BY MOUTH AT  BEDTIME 100 tablet 2   glipiZIDE (GLUCOTROL) 10 MG tablet Take 2 tablets (20 mg total) by mouth 2 (two) times daily. 360 tablet 3   Insulin Syringe-Needle U-100 (BD INSULIN SYRINGE U/F) 31G X 5/16" 0.3 ML MISC USE TWICE DAILY WITH  INSULIN 180 each 3   JARDIANCE 25 MG TABS tablet TAKE 1 TABLET BY MOUTH DAILY 100 tablet 2   meloxicam (MOBIC) 7.5 MG tablet Take 1 tablet (7.5 mg total) by mouth daily. 90 tablet 0   metFORMIN (GLUCOPHAGE-XR) 500 MG 24 hr tablet TAKE 4 TABLETS BY MOUTH  ONCE DAILY WITH BREAKFAST 360 tablet 3   Multiple Vitamins-Minerals (MULTIVITAMIN WITH MINERALS) tablet Take 2 tablets by mouth daily.     Omega-3 Fatty Acids (FISH OIL) 1000 MG CPDR Take 4 capsules by mouth every other day.     rosuvastatin (CRESTOR) 20 MG  tablet TAKE 1 TABLET BY MOUTH DAILY 70 tablet 4   triamcinolone cream (KENALOG) 0.1 % APPLY TOPICALLY TO AFFECTED  AREA(S) TWICE DAILY AS NEEDED 45 g 0   Vitamin D, Ergocalciferol, (DRISDOL) 1.25 MG (50000 UNIT) CAPS capsule Take 1 capsule (50,000 Units total) by mouth every 7 (seven) days. 8 capsule 0   No current facility-administered medications on file prior to visit.     ROS see history of present illness  Objective  Physical Exam Vitals:   12/25/22 1530  BP: 118/70  Pulse: 92  Temp: 98.2 F (36.8 C)  SpO2: 95%    BP Readings from Last 3 Encounters:  12/25/22 118/70  12/12/22 117/65  11/15/22 (!) 141/70   Wt Readings from Last 3 Encounters:  12/25/22 251 lb 12.8 oz (114.2 kg)  12/12/22 248 lb (112.5 kg)   11/15/22 250 lb (113.4 kg)    Physical Exam Constitutional:      General: He is not in acute distress.    Appearance: He is not diaphoretic.  Cardiovascular:     Rate and Rhythm: Normal rate and regular rhythm.     Heart sounds: Normal heart sounds.  Pulmonary:     Effort: Pulmonary effort is normal.     Breath sounds: Normal breath sounds.  Musculoskeletal:     Comments: Slight tenderness over his right trapezius, right pectoral, and right deltoid muscles  Skin:    General: Skin is warm and dry.  Neurological:     Mental Status: He is alert.    Diabetic Foot Exam - Simple   Simple Foot Form Diabetic Foot exam was performed with the following findings: Yes 12/25/2022  4:26 PM  Visual Inspection See comments: Yes Sensation Testing See comments: Yes Pulse Check See comments: Yes Comments Onychomycosis throughout, no ulcerations noted, there is dirt on his feet, reduced monofilament and light touch sensation bilaterally, difficult to palpate PT and DP pulses       Assessment/Plan: Please see individual problem list.  Essential hypertension Assessment & Plan: Chronic issue. Adequately controlled.  Patient will continue chlorthalidone 50 mg daily.  Orders: -     Basic metabolic panel  Type 2 diabetes mellitus with diabetic neuropathy, with long-term current use of insulin ([place]) Assessment & Plan: Chronic issue.  Poorly controlled.  Patient reports has been eating a lot of sugary foods recently.  He will reduce his sugary food intake.  He will continue glipizide 20 mg twice daily, Jardiance 25 mg daily, metformin XR 2000 mg daily, and NPH insulin 25 units in the morning and 20 units in the evening.  Orders: -     POCT glycosylated hemoglobin (Hb A1C) -     Microalbumin / creatinine urine ratio -     FreeStyle Libre 2 Reader; Use at least every 8 hours to read your blood glucose  Dispense: 1 each; Refill: 0 -     FreeStyle Libre 14 Day Sensor; Apply every 14 days, used  to check glucose at least 3 times a day, diagnosis code E11.9  Dispense: 6 each; Refill: 1  Prostate cancer screening -     PSA, Medicare  Myalgia Assessment & Plan: Focal.  Most likely related to using his walker and having muscle overuse with this.  He can trial Voltaren gel.  This will be sent to his pharmacy.  He is hesitant to try any oral NSAIDs given risk of dizziness.  Orders: -     Diclofenac Sodium; Apply 2 g topically 4 (four)  times daily as needed (muscle and joint pain).  Dispense: 150 g; Refill: 0  Decreased pedal pulses Assessment & Plan: Difficult to palpate pedal pulses.  He reports vascular surgery saw him recently and felt his pulses adequately.  I did discuss getting ABIs though he is hesitant to go to the hospital for this unless he really needs to.  I will send my note to his vascular surgery provider to see if they just want to do this when he comes to see them.   Other orders -     Insulin NPH (Human) ([place]); INJECT SUBCUTANEOUSLY 20  UNITS IN THE MORNING AND 15 UNITS AT BEDTIME  Dispense: 40 mL; Refill: 3     Return in about 3 months (around 03/26/2023) for Diabetes.   [name], MD [place]

## 2022-12-25 NOTE — Addendum Note (Signed)
Addended by: Leeanne Rio on: 12/25/2022 05:07 PM   Modules accepted: Orders

## 2022-12-25 NOTE — Assessment & Plan Note (Signed)
Chronic issue.  Poorly controlled.  Patient reports has been eating a lot of sugary foods recently.  He will reduce his sugary food intake.  He will continue glipizide 20 mg twice daily, Jardiance 25 mg daily, metformin XR 2000 mg daily, and NPH insulin 25 units in the morning and 20 units in the evening.

## 2022-12-25 NOTE — Assessment & Plan Note (Addendum)
Chronic issue. Adequately controlled.  Patient will continue chlorthalidone 50 mg daily.

## 2022-12-26 LAB — BASIC METABOLIC PANEL
BUN: 24 mg/dL — ABNORMAL HIGH (ref 6–23)
CO2: 24 mEq/L (ref 19–32)
Calcium: 9.2 mg/dL (ref 8.4–10.5)
Chloride: 101 mEq/L (ref 96–112)
Creatinine, Ser: 1.04 mg/dL (ref 0.40–1.50)
GFR: 67.89 mL/min (ref >60.00)
Glucose, Bld: 190 mg/dL — ABNORMAL HIGH (ref 70–99)
Potassium: 3.9 mEq/L (ref 3.5–5.1)
Sodium: 139 mEq/L (ref 135–145)

## 2022-12-26 LAB — PSA, MEDICARE: PSA: 4 ng/ml (ref 0.10–4.00)

## 2023-01-11 DIAGNOSIS — N481 Balanitis: Secondary | ICD-10-CM | POA: Diagnosis not present

## 2023-01-11 DIAGNOSIS — R300 Dysuria: Secondary | ICD-10-CM | POA: Diagnosis not present

## 2023-01-30 ENCOUNTER — Ambulatory Visit (INDEPENDENT_AMBULATORY_CARE_PROVIDER_SITE_OTHER): Payer: 59 | Admitting: Nurse Practitioner

## 2023-02-14 ENCOUNTER — Other Ambulatory Visit: Payer: Self-pay | Admitting: Family Medicine

## 2023-03-28 ENCOUNTER — Ambulatory Visit (INDEPENDENT_AMBULATORY_CARE_PROVIDER_SITE_OTHER): Payer: 59 | Admitting: Family Medicine

## 2023-03-28 ENCOUNTER — Encounter: Payer: Self-pay | Admitting: Family Medicine

## 2023-03-28 VITALS — BP 118/68 | HR 86 | Temp 97.6°F

## 2023-03-28 DIAGNOSIS — N471 Phimosis: Secondary | ICD-10-CM | POA: Diagnosis not present

## 2023-03-28 DIAGNOSIS — E1140 Type 2 diabetes mellitus with diabetic neuropathy, unspecified: Secondary | ICD-10-CM

## 2023-03-28 DIAGNOSIS — M542 Cervicalgia: Secondary | ICD-10-CM

## 2023-03-28 DIAGNOSIS — I878 Other specified disorders of veins: Secondary | ICD-10-CM

## 2023-03-28 DIAGNOSIS — Z794 Long term (current) use of insulin: Secondary | ICD-10-CM

## 2023-03-28 DIAGNOSIS — I10 Essential (primary) hypertension: Secondary | ICD-10-CM | POA: Diagnosis not present

## 2023-03-28 MED ORDER — BLOOD GLUCOSE MONITORING SUPPL DEVI: 1.0000 | Freq: Every day | 0 refills | Status: DC

## 2023-03-28 MED ORDER — BLOOD GLUCOSE TEST VI STRP: 1.0000 | ORAL_STRIP | Freq: Every day | 0 refills | Status: AC

## 2023-03-28 MED ORDER — LANCET DEVICE MISC: 1.0000 | Freq: Every day | 0 refills | Status: AC

## 2023-03-28 MED ORDER — LANCETS MISC. MISC: 1.0000 | Freq: Every day | 0 refills | Status: AC

## 2023-03-28 MED ORDER — DOXYCYCLINE HYCLATE 100 MG PO TABS
100.0000 mg | ORAL_TABLET | Freq: Two times a day (BID) | ORAL | 0 refills | Status: DC
Start: 2023-03-28 — End: 2023-09-03

## 2023-03-28 MED ORDER — KETOCONAZOLE 2 % EX CREA: 1.0000 | TOPICAL_CREAM | Freq: Every day | CUTANEOUS | 0 refills | Status: DC

## 2023-03-28 NOTE — Progress Notes (Signed)
Marikay Alar, MD Phone: [phone]  Patrick Patel is a 82 y.o. male who presents today for f/u.  DIABETES Disease Monitoring: Blood Sugar ranges-not checking Polyuria/phagia/dipsia- no       Medications: Compliance- taking glipizide, jardiance, metformin, NPH  Hypertension: Patient is on chlorthalidone.  No changes to chronic chest pain.  No shortness of breath.  For skin swelling: Patient notes this has been going on recently.  He is not able to retract his foreskin.  He is still able to urinate.  Soft tissue neck pain: Patient notes this has been going on 2.5-3 years.  At first he thought it was related to muscular soreness though it has persisted for too long and to persistently and he wonders if anything else is going on.  He did have an ultrasound of his neck previously that did not reveal any specific cause.  Leg swelling: Patient notes his right leg swelling went down with an antibiotic.  Recently there has been some swelling and redness in his right leg.  He also has more swelling and pain in his left lower leg for the last month.  He brought over-the-counter compression stockings and did not get the prescription compression stockings filled previously.   Social History   Tobacco Use  Smoking Status Former  Smokeless Tobacco Current   Types: Snuff    Current Outpatient Medications on File Prior to Visit  Medication Sig Dispense Refill   Accu-Chek Softclix Lancets lancets Use as instructedUSE TO CHECK BLOOD GLUCOSE 3 TIMES DAILY 300 each 2   acetaminophen (TYLENOL) 500 MG tablet Take 1,000 mg by mouth 2 (two) times daily at 10 AM and 5 PM.      alfuzosin (UROXATRAL) 10 MG 24 hr tablet TAKE 1 TABLET BY MOUTH DAILY  WITH BREAKFAST 100 tablet 2   blood glucose meter kit and supplies KIT Dispense Accu-Chek Aviva plus. Check once daily as directed. For ICD 10 code E11.9. 1 each 0   Calcium-Magnesium-Zinc 167-83-8 MG TABS Take 4 tablets by mouth daily.     chlorthalidone  (HYGROTON) 50 MG tablet TAKE 1 TABLET BY MOUTH DAILY 100 tablet 2   Continuous Blood Gluc Receiver (FREESTYLE LIBRE 2 READER) DEVI Use at least every 8 hours to read your blood glucose 1 each 0   Continuous Blood Gluc Sensor (FREESTYLE LIBRE 14 DAY SENSOR) MISC Apply every 14 days, used to check glucose at least 3 times a day, diagnosis code E11.9 6 each 1   diclofenac Sodium (VOLTAREN) 1 % GEL Apply 2 g topically 4 (four) times daily as needed (muscle and joint pain). 150 g 0   diphenhydrAMINE (BENADRYL) 25 MG tablet Take 50 mg by mouth at bedtime as needed for sleep.     Emollient (EUCERIN) lotion Apply topically as needed for dry skin. 480 mL 3   famotidine (PEPCID) 20 MG tablet TAKE 1 TABLET BY MOUTH AT  BEDTIME 100 tablet 2   glipiZIDE (GLUCOTROL) 10 MG tablet Take 2 tablets (20 mg total) by mouth 2 (two) times daily. 360 tablet 3   insulin NPH Human (HUMULIN N) 100 UNIT/ML injection INJECT SUBCUTANEOUSLY 20  UNITS IN THE MORNING AND 15 UNITS AT BEDTIME 40 mL 3   Insulin Syringe-Needle U-100 (BD INSULIN SYRINGE U/F) 31G X 5/16" 0.3 ML MISC USE TWICE DAILY WITH  INSULIN 180 each 3   metFORMIN (GLUCOPHAGE-XR) 500 MG 24 hr tablet TAKE 4 TABLETS BY MOUTH  ONCE DAILY WITH BREAKFAST 360 tablet 3   Multiple Vitamins-Minerals (  MULTIVITAMIN WITH MINERALS) tablet Take 2 tablets by mouth daily.     Omega-3 Fatty Acids (FISH OIL) 1000 MG CPDR Take 4 capsules by mouth every other day.     rosuvastatin (CRESTOR) 20 MG tablet TAKE 1 TABLET BY MOUTH DAILY 70 tablet 4   triamcinolone cream (KENALOG) 0.1 % APPLY TOPICALLY TO AFFECTED  AREA(S) TWICE DAILY AS NEEDED 45 g 0   No current facility-administered medications on file prior to visit.     ROS see history of present illness  Objective  Physical Exam Vitals:   03/28/23 1630  BP: 118/68  Pulse: 86  Temp: 97.6 F (36.4 C)  SpO2: 94%    BP Readings from Last 3 Encounters:  03/28/23 118/68  12/25/22 118/70  12/12/22 117/65   Wt Readings  from Last 3 Encounters:  12/25/22 251 lb 12.8 oz (114.2 kg)  12/12/22 248 lb (112.5 kg)  11/15/22 250 lb (113.4 kg)    Physical Exam Constitutional:      General: He is not in acute distress.    Appearance: He is not diaphoretic.  Cardiovascular:     Rate and Rhythm: Normal rate and regular rhythm.     Heart sounds: Normal heart sounds.  Pulmonary:     Effort: Pulmonary effort is normal.     Breath sounds: Normal breath sounds.  Genitourinary:    Comments: Phimosis noted, slight erythema of the distal tip of the foreskin Musculoskeletal:     Comments: Erythematous thickened skin distal right lower leg, left leg appears swollen with some thickened and firm skin throughout the calf and anterior lower leg areas, tenderness in the left lower leg  Skin:    General: Skin is warm and dry.  Neurological:     Mental Status: He is alert.      Assessment/Plan: Please see individual problem list.  Type 2 diabetes mellitus with diabetic neuropathy, with long-term current use of insulin Assessment & Plan: Chronic issue.  Undetermined control.  Patient is stopping Jardiance given phimosis.  He will continue glipizide 20 mg twice daily, insulin NPH 20 units in the morning and 15 units at bedtime, and metformin XR 2000 mg daily.  Check A1c.  Orders: -     Blood Glucose Monitoring Suppl; 1 each by Does not apply route daily before breakfast. May substitute to any manufacturer covered by patient's insurance.  Dispense: 1 each; Refill: 0 -     Blood Glucose Test; 1 each by In Vitro route daily before breakfast. May substitute to any manufacturer covered by patient's insurance.  Dispense: 100 strip; Refill: 0 -     Lancet Device; 1 each by Does not apply route daily before breakfast. May substitute to any manufacturer covered by patient's insurance.  Dispense: 1 each; Refill: 0 -     Lancets Misc.; 1 each by Does not apply route daily before breakfast. May substitute to any manufacturer covered by  patient's insurance.  Dispense: 100 each; Refill: 0 -     Hemoglobin A1c -     Basic metabolic panel  Venous stasis of both lower extremities Assessment & Plan: Patient with swelling and both legs left greater than right.  Discussed getting an ultrasound of his left lower extremity given his swelling and pain though he declines today.  I am treating him for possible cellulitis with the erythema in his right leg.  He will be treated with doxycycline 100 mg twice daily.  Patient wants to see if the antibiotic will help with  his left leg before doing an ultrasound.  If his left leg is not improving he will let me know and I can order an ultrasound for him.  I also advised medical grade compression stockings for him.  Advised not to wear compression stockings overnight given the risk of cutting off the blood supply to his legs and feet.   Essential hypertension Assessment & Plan: Chronic issue.  Adequately controlled.  Continue chlorthalidone 50 mg daily.   Phimosis Assessment & Plan: Patient with phimosis on exam today.  I advised that he stop the Jardiance.  We will treat with ketoconazole topically.  Discussed the need to see urology to consider circumcision though the patient declines this at this time.  I advised that he not retract his foreskin.  Discussed if he retracts his foreskin and it gets stuck he has to go to the hospital.  Orders: -     Ketoconazole; Apply 1 Application topically daily.  Dispense: 15 g; Refill: 0  Anterior neck pain Assessment & Plan: Chronic issue.  Patient with anterior soft tissue neck pain that is been going on for several years.  Prior ultrasound did not reveal a specific cause for this.  Given the ongoing nature of this we will order CT soft tissue neck with contrast to evaluate further.  I did discuss that the patient's pain could be muscular.  Orders: -     CT SOFT TISSUE NECK W CONTRAST; Future  Other orders -     Doxycycline Hyclate; Take 1 tablet  (100 mg total) by mouth 2 (two) times daily.  Dispense: 14 tablet; Refill: 0     Return in about 3 months (around 06/27/2023).   Marikay Alar, MD [facility]

## 2023-03-28 NOTE — Assessment & Plan Note (Signed)
Chronic issue.  Adequately controlled.  Continue chlorthalidone 50 mg daily.

## 2023-03-28 NOTE — Assessment & Plan Note (Addendum)
Patient with swelling and both legs left greater than right.  Discussed getting an ultrasound of his left lower extremity given his swelling and pain though he declines today.  I am treating him for possible cellulitis with the erythema in his right leg.  He will be treated with doxycycline 100 mg twice daily.  Patient wants to see if the antibiotic will help with his left leg before doing an ultrasound.  If his left leg is not improving he will let me know and I can order an ultrasound for him.  I also advised medical grade compression stockings for him.  Advised not to wear compression stockings overnight given the risk of cutting off the blood supply to his legs and feet.

## 2023-03-28 NOTE — Assessment & Plan Note (Signed)
Chronic issue.  Patient with anterior soft tissue neck pain that is been going on for several years.  Prior ultrasound did not reveal a specific cause for this.  Given the ongoing nature of this we will order CT soft tissue neck with contrast to evaluate further.  I did discuss that the patient's pain could be muscular.

## 2023-03-28 NOTE — Assessment & Plan Note (Signed)
Chronic issue.  Undetermined control.  Patient is stopping Jardiance given phimosis.  He will continue glipizide 20 mg twice daily, insulin NPH 20 units in the morning and 15 units at bedtime, and metformin XR 2000 mg daily.  Check A1c.

## 2023-03-28 NOTE — Assessment & Plan Note (Signed)
Patient with phimosis on exam today.  I advised that he stop the Jardiance.  We will treat with ketoconazole topically.  Discussed the need to see urology to consider circumcision though the patient declines this at this time.  I advised that he not retract his foreskin.  Discussed if he retracts his foreskin and it gets stuck he has to go to the hospital.

## 2023-03-28 NOTE — Patient Instructions (Addendum)
Nice to see you. Please stop your Jardiance. Somebody will call you to schedule your CT scan. Please do not retract your foreskin over the head of your penis.  If you do this in a get stuck you need to go to the emergency room. Please apply the ketoconazole cream to your foreskin as prescribed.  If your foreskin is not improving you will need to see urology to have a circumcision.  Please let me know if this is not improving. I am going to treat your legs with doxycycline. Will also have somebody call you to get an ultrasound scheduled of your left leg.

## 2023-03-29 ENCOUNTER — Telehealth: Payer: Self-pay | Admitting: Family Medicine

## 2023-03-29 LAB — BASIC METABOLIC PANEL
BUN: 25 mg/dL — ABNORMAL HIGH (ref 6–23)
CO2: 23 mEq/L (ref 19–32)
Calcium: 9.5 mg/dL (ref 8.4–10.5)
Chloride: 101 mEq/L (ref 96–112)
Creatinine, Ser: 1.04 mg/dL (ref 0.40–1.50)
GFR: 67.77 mL/min (ref >60.00)
Glucose, Bld: 158 mg/dL — ABNORMAL HIGH (ref 70–99)
Potassium: 3.9 mEq/L (ref 3.5–5.1)
Sodium: 137 mEq/L (ref 135–145)

## 2023-03-29 LAB — HEMOGLOBIN A1C: Hgb A1c MFr Bld: 7.5 % — ABNORMAL HIGH (ref 4.6–6.5)

## 2023-03-29 NOTE — Telephone Encounter (Signed)
Lft pt vm to call ofc to sch CT. thanks 

## 2023-03-30 ENCOUNTER — Telehealth: Payer: Self-pay | Admitting: Family Medicine

## 2023-03-30 NOTE — Telephone Encounter (Signed)
Lft pt vm to call ofc to sch CT. thanks 

## 2023-04-10 ENCOUNTER — Ambulatory Visit
Admission: RE | Admit: 2023-04-10 | Discharge: 2023-04-10 | Disposition: A | Discharge status: Home / Self Care | Payer: 59 | Source: Ambulatory Visit | Attending: Family Medicine | Admitting: Family Medicine

## 2023-04-10 DIAGNOSIS — M542 Cervicalgia: Secondary | ICD-10-CM | POA: Diagnosis not present

## 2023-04-10 DIAGNOSIS — R221 Localized swelling, mass and lump, neck: Secondary | ICD-10-CM | POA: Diagnosis not present

## 2023-04-10 MED ORDER — IOHEXOL 300 MG/ML  SOLN
75.0000 mL | Freq: Once | INTRAMUSCULAR | Status: AC | PRN
  Administered 2023-04-10: 75 mL via INTRAVENOUS

## 2023-04-16 ENCOUNTER — Telehealth: Payer: Self-pay

## 2023-04-16 NOTE — Telephone Encounter (Signed)
-----   Message from Glori Luis, MD sent at 04/16/2023  9:29 AM EDT ----- Please let the patient know that there was no finding on the CT scan that would explain his right neck discomfort.

## 2023-04-16 NOTE — Telephone Encounter (Signed)
Left message to call the office back regarding his CT scan results

## 2023-04-16 NOTE — Telephone Encounter (Signed)
Pt called back and I read the message to him but he saw them on mychart

## 2023-05-03 ENCOUNTER — Other Ambulatory Visit: Payer: Self-pay | Admitting: Family Medicine

## 2023-05-03 DIAGNOSIS — E1140 Type 2 diabetes mellitus with diabetic neuropathy, unspecified: Secondary | ICD-10-CM

## 2023-05-14 DIAGNOSIS — L03115 Cellulitis of right lower limb: Secondary | ICD-10-CM | POA: Diagnosis not present

## 2023-05-14 DIAGNOSIS — R350 Frequency of micturition: Secondary | ICD-10-CM | POA: Diagnosis not present

## 2023-05-14 DIAGNOSIS — N390 Urinary tract infection, site not specified: Secondary | ICD-10-CM | POA: Diagnosis not present

## 2023-06-29 ENCOUNTER — Ambulatory Visit: Payer: 59 | Admitting: Family Medicine

## 2023-07-07 ENCOUNTER — Other Ambulatory Visit: Payer: Self-pay | Admitting: Family Medicine

## 2023-07-07 ENCOUNTER — Other Ambulatory Visit: Payer: Self-pay | Admitting: Family

## 2023-07-18 ENCOUNTER — Ambulatory Visit
Admission: RE | Admit: 2023-07-18 | Discharge: 2023-07-18 | Disposition: A | Discharge status: Home / Self Care | Payer: 59 | Source: Ambulatory Visit | Attending: Emergency Medicine | Admitting: Emergency Medicine

## 2023-07-18 VITALS — BP 147/73 | HR 93 | Temp 98.3°F | Resp 18

## 2023-07-18 DIAGNOSIS — L239 Allergic contact dermatitis, unspecified cause: Secondary | ICD-10-CM | POA: Diagnosis not present

## 2023-07-18 MED ORDER — TRIAMCINOLONE ACETONIDE 0.1 % EX CREA: 1.0000 | TOPICAL_CREAM | Freq: Two times a day (BID) | CUTANEOUS | 2 refills | Status: DC

## 2023-07-18 MED ORDER — PREDNISONE 10 MG (21) PO TBPK: ORAL_TABLET | Freq: Every day | ORAL | 0 refills | Status: DC

## 2023-07-18 MED ORDER — DEXAMETHASONE SODIUM PHOSPHATE 10 MG/ML IJ SOLN
10.0000 mg | Freq: Once | INTRAMUSCULAR | Status: AC
  Administered 2023-07-18: 10 mg via INTRAMUSCULAR

## 2023-07-18 NOTE — Discharge Instructions (Addendum)
Today your evaluated for your rash, appears to be inflammatory, no current signs of infection  Causes unknown but we can still move forward with trying to help clear your symptoms  You have been given an injection of steroids here today in the office to help start to reduce symptoms, daily will see improvement in 30 minutes to an hour  Starting tomorrow take prednisone tablets every morning with food as directed, this will temporarily increase your blood sugar, monitor closely  Will attempt to increase steroid cream dosage as a way to relieve itching, may apply twice daily as needed  May continue use of menthol cream not eczema as it has been helpful  May use cool or warm compresses over the skin to further soothe  If your symptoms continue to persist or worsen you may follow-up with your primary doctor or this urgent care for further evaluation

## 2023-07-18 NOTE — ED Triage Notes (Addendum)
Patient to Urgent Care with complaints of rash present to bilateral arms/ ribs and abdomen/ behind both ears. Poor sleep. Denies any new product usage/ food usage. Has not been outside or done any yard work.  Started 10 days ago. Used multiple otc medications including topical 4% menthol and noxzema to help with the itching.

## 2023-07-18 NOTE — ED Provider Notes (Signed)
Patrick Patel    CSN: [id number] Arrival date & time: 07/18/23  1745      History   Chief Complaint Chief Complaint  Patient presents with   Rash    HPI Patrick Patel is a 82 y.o. male.   Patient presents for evaluation of erythematous rash present above the waist for 10 days.  Initially began on the right side of the face and behind the ear and spread to the trunk of the body and the bilateral upper arms.  Severely pruritic.  Denies changes in toiletries, diet, recent travel or medication changes.  Has not occurred before.  Has attempted use of hydrocortisone, lidocaine which has been ineffective.  Currently managing with 4% menthol and not similar cream which has helped to sooth the skin.  Denies presence of drainage or fever.  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   Dyslipidemia 06/25/2022   Acute lower UTI 06/25/2022   Abrasion of toe of right foot 05/26/2020   Onychomycosis 05/26/2020   De Quervain's tenosynovitis, left 05/26/2020   Trigger finger 02/23/2020   GERD (gastroesophageal reflux disease) 11/20/2019   Hyperpigmented skin lesion 02/06/2019   Hematuria 02/04/2019   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 No pertinent surgical history.     Home Medications    Prior to Admission medications   Medication Sig Start Date End Date Taking? Authorizing Provider  predniSONE (STERAPRED UNI-PAK 21 TAB) 10 MG (21) TBPK tablet Take by mouth daily. Take 6 tabs by mouth daily  for 1 days, then 5 tabs for 1 days, then 4 tabs for 1 days, then 3 tabs for 1 days, 2 tabs for 1 days, then 1 tab by mouth daily for 1 days 07/18/23  Yes Jayten Gabbard R, NP  triamcinolone cream (KENALOG) 0.1 % Apply 1 Application topically 2 (two) times daily. 07/18/23  Yes Donita Newland, Elita Boone, NP  Accu-Chek Softclix Lancets lancets Use as instructedUSE TO CHECK BLOOD GLUCOSE 3 TIMES DAILY 02/03/22   Glori Luis, MD  acetaminophen (TYLENOL) 500 MG tablet Take 1,000 mg by mouth 2 (two) times daily at 10 AM and 5 PM.     [provider]  alfuzosin (UROXATRAL) 10 MG 24 hr tablet TAKE 1 TABLET BY MOUTH DAILY  WITH BREAKFAST 02/15/23   Glori Luis, MD  blood glucose meter kit and supplies KIT Dispense Accu-Chek Aviva plus. Check once daily as directed. For ICD 10 code E11.9. 12/14/16   Glori Luis, MD  Blood Glucose Monitoring Suppl DEVI 1 each by Does not apply route daily before  breakfast. May substitute to any manufacturer covered by patient's insurance. 03/28/23   Glori Luis, MD  Calcium-Magnesium-Zinc [phone] MG TABS Take 4 tablets by mouth daily.    [provider]  chlorthalidone (HYGROTON) 50 MG tablet TAKE 1 TABLET BY MOUTH DAILY 07/09/23   Glori Luis, MD  Continuous Blood Gluc Receiver (FREESTYLE LIBRE 2 READER) DEVI Use at least every 8 hours to read your blood glucose 12/25/22   Glori Luis, MD  Continuous Blood Gluc Sensor (FREESTYLE LIBRE 14 DAY SENSOR) MISC Apply every 14 days, used to check glucose at least 3  times a day, diagnosis code E11.9 12/25/22   Glori Luis, MD  diclofenac Sodium (VOLTAREN) 1 % GEL Apply 2 g topically 4 (four) times daily as needed (muscle and joint pain). 12/25/22   Glori Luis, MD  diphenhydrAMINE (BENADRYL) 25 MG tablet Take 50 mg by mouth at bedtime as needed for sleep.    [provider]  doxycycline (VIBRA-TABS) 100 MG tablet Take 1 tablet (100 mg total) by mouth 2 (two) times daily. 03/28/23   Glori Luis, MD  Emollient (EUCERIN) lotion Apply topically as needed for dry skin. 11/15/22   Georgiana Spinner, NP  famotidine (PEPCID) 20 MG tablet TAKE 1 TABLET BY MOUTH AT  BEDTIME 04/24/22   Glori Luis, MD  glipiZIDE (GLUCOTROL) 10 MG tablet TAKE 2 TABLETS BY MOUTH TWICE  DAILY 05/04/23   Glori Luis, MD  insulin NPH Human (HUMULIN N) 100 UNIT/ML injection INJECT SUBCUTANEOUSLY 20  UNITS IN THE MORNING AND 15 UNITS AT BEDTIME 12/25/22   Glori Luis, MD  Insulin Syringe-Needle U-100 (BD INSULIN SYRINGE U/F) 31G X 5/16" 0.3 ML MISC USE TWICE DAILY WITH  INSULIN 08/31/22   Glori Luis, MD  ketoconazole (NIZORAL) 2 % cream Apply 1 Application topically daily. 03/28/23   Glori Luis, MD  metFORMIN (GLUCOPHAGE-XR) 500 MG 24 hr tablet TAKE 4 TABLETS BY MOUTH ONCE  DAILY WITH BREAKFAST 05/04/23   Glori Luis, MD  Multiple Vitamins-Minerals (MULTIVITAMIN WITH MINERALS) tablet Take 2 tablets by mouth daily.    [provider]  Omega-3 Fatty Acids (FISH OIL) 1000 MG CPDR Take 4 capsules by mouth every other day.    [provider]  rosuvastatin (CRESTOR) 20 MG tablet TAKE 1 TABLET BY MOUTH DAILY 08/17/22   Eulis Foster, FNP    Family History Family History  Problem Relation Age of Onset   Breast cancer Mother    Thyroid cancer Mother     Social History Social History   Tobacco Use   Smoking status: Former   Smokeless tobacco: Current    Types: Snuff  Substance Use Topics   Alcohol use: No     Alcohol/week: 0.0 standard drinks of alcohol   Drug use: No     Allergies   Bactrim [sulfamethoxazole-trimethoprim], Pioglitazone, and Rosiglitazone   Review of Systems Review of Systems  Constitutional: Negative.   HENT: Negative.    Respiratory: Negative.    Cardiovascular: Negative.   Skin:  Positive for rash. Negative for color change, pallor and wound.     Physical Exam Triage Vital Signs ED Triage Vitals  Encounter Vitals Group     BP 07/18/23 1833 (!) 147/73     Systolic BP Percentile --      Diastolic BP Percentile --      Pulse Rate 07/18/23 1833 93     Resp 07/18/23 1833 18  Temp 07/18/23 1833 98.3 F (36.8 C)     Temp src --      SpO2 07/18/23 1833 96 %     Weight --      Height --      Head Circumference --      Peak Flow --      Pain Score 07/18/23 1830 0     Pain Loc --      Pain Education --      Exclude from Growth Chart --    No data found.  Updated Vital Signs BP (!) 147/73   Pulse 93   Temp 98.3 F (36.8 C)   Resp 18   SpO2 96%   Visual Acuity Right Eye Distance:   Left Eye Distance:   Bilateral Distance:    Right Eye Near:   Left Eye Near:    Bilateral Near:     Physical Exam Constitutional:      Appearance: Normal appearance.  Eyes:     Extraocular Movements: Extraocular movements intact.  Pulmonary:     Effort: Pulmonary effort is normal.  Skin:    Comments: Erythematous macular rash present to the bilateral upper extremities, trunk and neck  Neurological:     Mental Status: He is alert and oriented to person, place, and time. Mental status is at baseline.      UC Treatments / Results  Labs (all labs ordered are listed, but only abnormal results are displayed) Labs Reviewed - No data to display  EKG   Radiology No results found.  Procedures Procedures (including critical care time)  Medications Ordered in UC Medications  dexamethasone (DECADRON) injection 10 mg (10 mg Intramuscular Given 07/18/23 1854)     Initial Impression / Assessment and Plan / UC Course  I have reviewed the triage vital signs and the nursing notes.  Pertinent labs & imaging results that were available during my care of the patient were reviewed by me and considered in my medical decision making (see chart for details).  Allergic contact dermatitis  Vital signs are stable and patient is in no signs of distress nontoxic-appearing, erythematous rash appears to be inflammatory without signs of infection, discussed with patient, unknown etiology, Decadron injection given in office and prescribed prednisone taper, advised effects on blood sugar and advised to monitor closely at home, requesting additional topical product for has attempted use of most over-the-counter medications therefore we will increase topical steroid dosage with Kenalog, advised follow-up with urgent care or PCP if symptoms continue to persist or worsen Final Clinical Impressions(s) / UC Diagnoses   Final diagnoses:  Allergic contact dermatitis, unspecified trigger     Discharge Instructions      Today your evaluated for your rash, appears to be inflammatory, no current signs of infection  Causes unknown but we can still move forward with trying to help clear your symptoms  You have been given an injection of steroids here today in the office to help start to reduce symptoms, daily will see improvement in 30 minutes to an hour  Starting tomorrow take prednisone tablets every morning with food as directed, this will temporarily increase your blood sugar, monitor closely  Will attempt to increase steroid cream dosage as a way to relieve itching, may apply twice daily as needed  May continue use of menthol cream not eczema as it has been helpful  May use cool or warm compresses over the skin to further soothe  If your symptoms continue to persist or  worsen you may follow-up with your primary doctor or this urgent care for further  evaluation   ED Prescriptions     Medication Sig Dispense Auth. Provider   triamcinolone cream (KENALOG) 0.1 % Apply 1 Application topically 2 (two) times daily. 45 g Varnell Donate R, NP   predniSONE (STERAPRED UNI-PAK 21 TAB) 10 MG (21) TBPK tablet Take by mouth daily. Take 6 tabs by mouth daily  for 1 days, then 5 tabs for 1 days, then 4 tabs for 1 days, then 3 tabs for 1 days, 2 tabs for 1 days, then 1 tab by mouth daily for 1 days 21 tablet Crew Goren, Elita Boone, NP      PDMP not reviewed this encounter.   Valinda Hoar, NP 07/18/23 1902

## 2023-07-24 ENCOUNTER — Other Ambulatory Visit: Payer: Self-pay | Admitting: Family Medicine

## 2023-07-24 DIAGNOSIS — E1140 Type 2 diabetes mellitus with diabetic neuropathy, unspecified: Secondary | ICD-10-CM

## 2023-07-26 NOTE — Telephone Encounter (Signed)
Medication was refilled 1 week ago by a different provider. Is it okay to refuse?

## 2023-08-26 ENCOUNTER — Other Ambulatory Visit: Payer: Self-pay | Admitting: Family Medicine

## 2023-08-30 ENCOUNTER — Telehealth: Payer: Self-pay

## 2023-08-30 MED ORDER — ROSUVASTATIN CALCIUM 20 MG PO TABS: 20.0000 mg | ORAL_TABLET | Freq: Every day | ORAL | 1 refills | Status: DC

## 2023-08-31 NOTE — Telephone Encounter (Signed)
Prescription Request  08/31/2023  LOV: Visit date not found  What is the name of the medication or equipment? rosuvastatin   Have you contacted your pharmacy to request a refill? Yes   Which pharmacy would you like this sent to?  [facility]  Patient notified that their request is being sent to the clinical staff for review and that they should receive a response within 2 business days.   Please advise at [facility]

## 2023-08-31 NOTE — Telephone Encounter (Addendum)
Pt called stating no one has called him back and he would like to discuss what is going on with him and it has been four hours ago

## 2023-08-31 NOTE — Telephone Encounter (Signed)
Attempted to call pt no answer.  LMTCB.  Crestor refill was sent into [facility] yesterday 08/30/23.

## 2023-09-03 ENCOUNTER — Encounter: Payer: Self-pay | Admitting: Podiatry

## 2023-09-03 ENCOUNTER — Ambulatory Visit (INDEPENDENT_AMBULATORY_CARE_PROVIDER_SITE_OTHER): Payer: 59 | Admitting: Podiatry

## 2023-09-03 DIAGNOSIS — M79676 Pain in unspecified toe(s): Secondary | ICD-10-CM

## 2023-09-03 DIAGNOSIS — B351 Tinea unguium: Secondary | ICD-10-CM | POA: Diagnosis not present

## 2023-09-03 DIAGNOSIS — E1142 Type 2 diabetes mellitus with diabetic polyneuropathy: Secondary | ICD-10-CM

## 2023-09-03 DIAGNOSIS — S90211A Contusion of right great toe with damage to nail, initial encounter: Secondary | ICD-10-CM | POA: Diagnosis not present

## 2023-09-03 MED ORDER — METHYLPREDNISOLONE 4 MG PO TBPK: ORAL_TABLET | ORAL | 0 refills | Status: DC

## 2023-09-03 NOTE — Progress Notes (Signed)
Subjective:  Patient ID: Patrick Patel, male    DOB: 1941/06/14,  MRN: [id number] HPI Chief Complaint  Patient presents with   Nail Problem    1st and 4th toenails right - doesn't remember an injury, but nails are loose, 1st has dried blood around it, diabetic-last A1c was 7.5   New Patient (Initial Visit)    Est pt 06/2020    82 y.o. male presents with the above complaint.   ROS: Denies fever chills nausea vomit muscle aches pains calf pain back pain chest pain shortness of breath.  Past Medical History:  Diagnosis Date   Diabetes (HCC)    Hyperlipidemia    Hypertension    No past surgical history on file.  Current Outpatient Medications:    methylPREDNISolone (MEDROL DOSEPAK) 4 MG TBPK tablet, 6 day dose pack - take as directed, Disp: 21 tablet, Rfl: 0   Accu-Chek Softclix Lancets lancets, Use as instructedUSE TO CHECK BLOOD GLUCOSE 3 TIMES DAILY, Disp: 300 each, Rfl: 2   acetaminophen (TYLENOL) 500 MG tablet, Take 1,000 mg by mouth 2 (two) times daily at 10 AM and 5 PM. , Disp: , Rfl:    alfuzosin (UROXATRAL) 10 MG 24 hr tablet, TAKE 1 TABLET BY MOUTH DAILY  WITH BREAKFAST, Disp: 100 tablet, Rfl: 2   blood glucose meter kit and supplies KIT, Dispense Accu-Chek Aviva plus. Check once daily as directed. For ICD 10 code E11.9., Disp: 1 each, Rfl: 0   Blood Glucose Monitoring Suppl DEVI, 1 each by Does not apply route daily before breakfast. May substitute to any manufacturer covered by patient's insurance., Disp: 1 each, Rfl: 0   Calcium-Magnesium-Zinc 167-83-8 MG TABS, Take 4 tablets by mouth daily., Disp: , Rfl:    chlorthalidone (HYGROTON) 50 MG tablet, TAKE 1 TABLET BY MOUTH DAILY, Disp: 100 tablet, Rfl: 2   Continuous Blood Gluc Receiver (FREESTYLE LIBRE 2 READER) DEVI, Use at least every 8 hours to read your blood glucose, Disp: 1 each, Rfl: 0   Continuous Blood Gluc Sensor (FREESTYLE LIBRE 14 DAY SENSOR) MISC, Apply every 14 days, used to check glucose at least 3 times a day,  diagnosis code E11.9, Disp: 6 each, Rfl: 1   diphenhydrAMINE (BENADRYL) 25 MG tablet, Take 50 mg by mouth at bedtime as needed for sleep., Disp: , Rfl:    Emollient (EUCERIN) lotion, Apply topically as needed for dry skin., Disp: 480 mL, Rfl: 3   famotidine (PEPCID) 20 MG tablet, TAKE 1 TABLET BY MOUTH AT  BEDTIME, Disp: 100 tablet, Rfl: 2   glipiZIDE (GLUCOTROL) 10 MG tablet, TAKE 2 TABLETS BY MOUTH TWICE  DAILY, Disp: 400 tablet, Rfl: 2   insulin NPH Human (HUMULIN N) 100 UNIT/ML injection, INJECT SUBCUTANEOUSLY 20  UNITS IN THE MORNING AND 15 UNITS AT BEDTIME, Disp: 40 mL, Rfl: 3   Insulin Syringe-Needle U-100 (BD INSULIN SYRINGE U/F) 31G X 5/16" 0.3 ML MISC, USE TWICE DAILY WITH INSULIN, Disp: 200 each, Rfl: 1   ketoconazole (NIZORAL) 2 % cream, Apply 1 Application topically daily., Disp: 15 g, Rfl: 0   metFORMIN (GLUCOPHAGE-XR) 500 MG 24 hr tablet, TAKE 4 TABLETS BY MOUTH ONCE  DAILY WITH BREAKFAST, Disp: 400 tablet, Rfl: 2   Multiple Vitamins-Minerals (MULTIVITAMIN WITH MINERALS) tablet, Take 2 tablets by mouth daily., Disp: , Rfl:    Omega-3 Fatty Acids (FISH OIL) 1000 MG CPDR, Take 4 capsules by mouth every other day., Disp: , Rfl:    rosuvastatin (CRESTOR) 20 MG tablet, Take 1  tablet (20 mg total) by mouth daily., Disp: 90 tablet, Rfl: 1   triamcinolone cream (KENALOG) 0.1 %, APPLY TO AFFECTED AREA(S)  TOPICALLY TWICE DAILY AS NEEDED, Disp: 45 g, Rfl: 0  Allergies  Allergen Reactions   Bactrim [Sulfamethoxazole-Trimethoprim] Swelling and Rash   Pioglitazone Swelling   Rosiglitazone Other (See Comments)    Ankle swelling   Review of Systems Objective:  There were no vitals filed for this visit.  General: Well developed, nourished, in no acute distress, alert and oriented x3   Dermatological: Skin is warm, dry and supple bilateral.  Thick yellow dystrophic clinically mycotic first and fourth nail on the right foot have nearly been avulsed in total.  Remaining integument appears  unremarkable at this time. There are no open sores, no preulcerative lesions with exception of the toenails on the right foot, no rash or signs of infection present.  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  Vascular: Dorsalis Pedis artery and Posterior Tibial artery pedal pulses are 1/4 bilateral with immedate capillary fill time. Pedal hair growth absent. No varicosities and positive lower extremity edema present bilateral.   Neruologic: Grossly intact via light touch bilateral. Vibratory intact via tuning fork bilateral. Protective threshold with Semmes Wienstein monofilament diminished to all pedal sites bilateral. Patellar and Achilles deep tendon reflexes 2+ bilateral. No Babinski or clonus noted bilateral.   Musculoskeletal: No gross boney pedal deformities bilateral. No pain, crepitus, or limitation noted with foot and ankle range of motion bilateral. Muscular strength 5/5 in all groups tested bilateral.  Gait: Unassisted, Nonantalgic.    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  Started him on methylprednisolone for the legs.  Follow-up with him for routine nail debridement with Dr. Eloy End.     Jordanna Hendrie T. Brun[place], [place]

## 2023-09-03 NOTE — Telephone Encounter (Signed)
Left a message to call the office back.

## 2023-09-04 ENCOUNTER — Emergency Department: Payer: 59

## 2023-09-04 ENCOUNTER — Emergency Department
Admission: EM | Admit: 2023-09-04 | Discharge: 2023-09-05 | Disposition: A | Discharge status: Home / Self Care | Payer: 59 | Attending: Emergency Medicine | Admitting: Emergency Medicine

## 2023-09-04 ENCOUNTER — Other Ambulatory Visit: Payer: Self-pay

## 2023-09-04 DIAGNOSIS — M25551 Pain in right hip: Secondary | ICD-10-CM | POA: Diagnosis not present

## 2023-09-04 DIAGNOSIS — M7121 Synovial cyst of popliteal space [Baker], right knee: Secondary | ICD-10-CM | POA: Insufficient documentation

## 2023-09-04 DIAGNOSIS — M545 Low back pain, unspecified: Secondary | ICD-10-CM | POA: Diagnosis not present

## 2023-09-04 DIAGNOSIS — I10 Essential (primary) hypertension: Secondary | ICD-10-CM | POA: Insufficient documentation

## 2023-09-04 DIAGNOSIS — M79651 Pain in right thigh: Secondary | ICD-10-CM | POA: Diagnosis not present

## 2023-09-04 DIAGNOSIS — M79604 Pain in right leg: Secondary | ICD-10-CM | POA: Diagnosis not present

## 2023-09-04 DIAGNOSIS — M7918 Myalgia, other site: Secondary | ICD-10-CM

## 2023-09-04 DIAGNOSIS — R6889 Other general symptoms and signs: Secondary | ICD-10-CM | POA: Diagnosis not present

## 2023-09-04 DIAGNOSIS — Z743 Need for continuous supervision: Secondary | ICD-10-CM | POA: Diagnosis not present

## 2023-09-04 DIAGNOSIS — E119 Type 2 diabetes mellitus without complications: Secondary | ICD-10-CM | POA: Insufficient documentation

## 2023-09-04 NOTE — ED Triage Notes (Addendum)
Pt BIB [facility] from home. Reports R sided lower back pain with radiation to buttock, knee, and foot. Reports improvement in pain when laying flat. Pt denies any fall or injury. Reports hx of back pain. Ambulatory with walker at baseline.  Pt alert and oriented. Breathing unlabored and speaking in full sentences. Pt yelling repeatedly in triage and requiring constant redirection to stop doing so and comply with triage questions.

## 2023-09-05 ENCOUNTER — Telehealth: Payer: Self-pay | Admitting: Family Medicine

## 2023-09-05 ENCOUNTER — Emergency Department: Payer: 59

## 2023-09-05 DIAGNOSIS — M79604 Pain in right leg: Secondary | ICD-10-CM | POA: Diagnosis not present

## 2023-09-05 DIAGNOSIS — M25551 Pain in right hip: Secondary | ICD-10-CM | POA: Diagnosis not present

## 2023-09-05 DIAGNOSIS — R2689 Other abnormalities of gait and mobility: Secondary | ICD-10-CM | POA: Diagnosis not present

## 2023-09-05 DIAGNOSIS — M545 Low back pain, unspecified: Secondary | ICD-10-CM | POA: Diagnosis not present

## 2023-09-05 DIAGNOSIS — M7121 Synovial cyst of popliteal space [Baker], right knee: Secondary | ICD-10-CM | POA: Diagnosis not present

## 2023-09-05 LAB — CBG MONITORING, ED: Glucose-Capillary: 199 mg/dL — ABNORMAL HIGH (ref 70–99)

## 2023-09-05 MED ORDER — ACETAMINOPHEN 325 MG PO TABS
650.0000 mg | ORAL_TABLET | Freq: Once | ORAL | Status: AC
  Administered 2023-09-05: 650 mg via ORAL
  Filled 2023-09-05: qty 2

## 2023-09-05 MED ORDER — LIDOCAINE 5 % EX PTCH
2.0000 | MEDICATED_PATCH | CUTANEOUS | Status: DC
  Administered 2023-09-05: 2 via TRANSDERMAL
  Filled 2023-09-05: qty 2

## 2023-09-05 MED ORDER — LIDOCAINE 5 % EX PTCH: 1.0000 | MEDICATED_PATCH | Freq: Two times a day (BID) | CUTANEOUS | 0 refills | Status: DC

## 2023-09-05 MED ORDER — IBUPROFEN 600 MG PO TABS
600.0000 mg | ORAL_TABLET | Freq: Once | ORAL | Status: DC
  Filled 2023-09-05: qty 1

## 2023-09-05 NOTE — Telephone Encounter (Signed)
Spoke to Patient he declines an appointment at this time due to he had to get a taxi to the ED last night and lay in the back seat of the taxi and get them to put him in a bed due to the pain being so bad from the right buttocks to the right calf. Patient states the pain is constant and he can not stand or sit he has to lay down. Patient also states he needs some "strong pain medication called in at [facility] [address] He states if you call if strong pain medication then his sister can pick it up and maybe then he will be able to come in for an office visit. Patient wanted to know if we could make a house call. Patient states or you could call [employer] and approve him some home health care.

## 2023-09-05 NOTE — ED Provider Notes (Signed)
[facility] Provider Note    Event Date/Time   First MD Initiated Contact with Patient 09/04/23 2353     (approximate)   History   Back Pain   HPI  Patrick Patel is a 82 y.o. male   Past medical history of diabetes, hyperlipidemia, hypertension who presents with right buttock and posterior thigh soreness over the past several days.  No obvious inciting event.  Worsening.  Worse when standing walking or laying on that side.  Pain encompasses his right buttock, posterior thigh and popliteal area.  No rashes.  Denies any numbness or weakness.  No back pain.  No incontinence.  No swelling.  No history of blood clot.  No blood thinner.  No immobilization.  External Medical Documents Reviewed: DVT ultrasound obtained in 2023 on the right lower extremity that showed no DVT but a mildly complex Baker's cyst in the popliteal fossa.      Physical Exam   Triage Vital Signs: ED Triage Vitals  Encounter Vitals Group     BP 09/04/23 2136 (!) 162/67     Systolic BP Percentile --      Diastolic BP Percentile --      Pulse Rate 09/04/23 2136 77     Resp 09/04/23 2136 18     Temp 09/04/23 2136 98 F (36.7 C)     Temp Source 09/04/23 2136 Oral     SpO2 09/04/23 2136 97 %     Weight 09/04/23 2137 245 lb (111.1 kg)     Height 09/04/23 2137 5\' 9"  (1.753 m)     Head Circumference --      Peak Flow --      Pain Score 09/04/23 2140 10     Pain Loc --      Pain Education --      Exclude from Growth Chart --     Most recent vital signs: Vitals:   09/04/23 2136  BP: (!) 162/67  Pulse: 77  Resp: 18  Temp: 98 F (36.7 C)  SpO2: 97%    General: Awake, no distress.  CV:  Good peripheral perfusion.  Resp:  Normal effort.  Abd:  No distention.  Other:  Tenderness to palpation mild to the right buttock, posterior thigh, popliteal area, strong bounding pulse in the pedal right side.  Motor or sensory normal.  No skin rashes no cellulitic changes   ED  Results / Procedures / Treatments   Labs (all labs ordered are listed, but only abnormal results are displayed) Labs Reviewed - No data to display     RADIOLOGY I independently reviewed and interpreted x-ray of the hip see no obvious fracture or dislocation I also reviewed radiologist's formal read.   PROCEDURES:  Critical Care performed: No  Procedures   MEDICATIONS ORDERED IN ED: Medications  ibuprofen (ADVIL) tablet 600 mg (600 mg Oral Patient Refused/Not Given 09/05/23 0030)  acetaminophen (TYLENOL) tablet 650 mg (650 mg Oral Given 09/05/23 0030)    IMPRESSION / MDM / ASSESSMENT AND PLAN / ED COURSE  I reviewed the triage vital signs and the nursing notes.                                Patient's presentation is most consistent with acute presentation with potential threat to life or bodily function.  Differential diagnosis includes, but is not limited to, DVT, Baker's cyst, muscle soreness, fracture dislocation considered but  less likely, also considered spinal pathologies cord compression less likely    MDM:    Some mild to moderate muscle soreness tenderness to palpation of the area with no obvious inciting event, still ambulatory, with no back pain red flag symptoms to suggest vertebral/cord pathologies, will get DVT ultrasound rule out DVT, pelvic x-ray, pain medications.  I suspect muscle soreness, overuse, strain most likely.  No infectious changes, and strong distal pulses rules against infection or ischemia.  Ridging largely unremarkable except for a stable Baker's cyst right popliteal fossa unchanged from prior imaging.  Discharge.      FINAL CLINICAL IMPRESSION(S) / ED DIAGNOSES   Final diagnoses:  Buttock pain     Rx / DC Orders   ED Discharge Orders     None        Note:  This document was prepared using Dragon voice recognition software and may include unintentional dictation errors.    Pilar Jarvis, MD 09/05/23 [phone]

## 2023-09-05 NOTE — Telephone Encounter (Signed)
Pt would like to be called regarding being put in the hospital because of his walking ability and him not being able sit in a chair for thirty minutes before the pain is excruciating [phone]

## 2023-09-05 NOTE — Telephone Encounter (Signed)
He needs to be seen in the office for this to determine what needs to be done for this issue. It looks like he was just in the ED for this and they felt this was related to a muscular issue.

## 2023-09-05 NOTE — ED Notes (Signed)
Pt given walker and offered cab voucher. Pt refusing to leave and demanding to speak with administrator. Pt stating he needs to be admitted and see a specialist in the ER to figure out what's wrong with him. Writer requested Comptroller to speak with patient. Pt able to ambulate independently for short distances.

## 2023-09-05 NOTE — Discharge Instructions (Addendum)
Fortunately your testing in the emergency department did not show any signs of emergency conditions like broken bones, blood clots.  Pain may be due to a Baker's cyst behind your right knee.  Your primary doctor about further evaluation and management as needed.  Take acetaminophen 650 mg and ibuprofen 400 mg every 6 hours for pain.  Take with food.  Thank you for choosing [place] for your health care today!  Please see your primary doctor this week for a follow up appointment.   If you have any new, worsening, or unexpected symptoms call your doctor right away or come back to the emergency department for reevaluation.  It was my pleasure to care for you today.   Daneil Dan [place]esto Charon, MD

## 2023-09-06 NOTE — Telephone Encounter (Signed)
Patient just called back and he said he can barely get out of his bed to come to an appointment. And he really needs the medication.

## 2023-09-06 NOTE — Telephone Encounter (Signed)
Left message to call the office back regarding Dr. Purvis Sheffield recommendations below.

## 2023-09-06 NOTE — Telephone Encounter (Signed)
He needs to be evaluated for this issue. He can be scheduled in the office, go to urgent care or back to the ED if the pain is severe.

## 2023-09-06 NOTE — Telephone Encounter (Signed)
Lvm for pt to give office a call back

## 2023-09-06 NOTE — Telephone Encounter (Signed)
See other telephone encounter.

## 2023-09-10 NOTE — Telephone Encounter (Signed)
Pt would like to be called home health orders [phone]

## 2023-09-11 NOTE — Telephone Encounter (Signed)
Attempted to call the Patient- no answer or voicemail

## 2023-09-14 DIAGNOSIS — M5416 Radiculopathy, lumbar region: Secondary | ICD-10-CM | POA: Diagnosis not present

## 2023-10-01 ENCOUNTER — Telehealth (INDEPENDENT_AMBULATORY_CARE_PROVIDER_SITE_OTHER): Payer: 59 | Admitting: Family Medicine

## 2023-10-01 ENCOUNTER — Encounter: Payer: Self-pay | Admitting: Family Medicine

## 2023-10-01 VITALS — Ht 69.0 in

## 2023-10-01 DIAGNOSIS — M5431 Sciatica, right side: Secondary | ICD-10-CM | POA: Diagnosis not present

## 2023-10-01 DIAGNOSIS — M5430 Sciatica, unspecified side: Secondary | ICD-10-CM | POA: Insufficient documentation

## 2023-10-01 MED ORDER — GABAPENTIN 100 MG PO CAPS
200.0000 mg | ORAL_CAPSULE | Freq: Three times a day (TID) | ORAL | 2 refills | Status: DC
Start: 2023-10-01 — End: 2023-10-08

## 2023-10-01 NOTE — Assessment & Plan Note (Signed)
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  We discussed narcotics are not typical treatment for this type of pain.  Discussed referral for home health physical therapy and nursing to evaluate further.  Discussed a trial of gabapentin 200 mg 3 times daily.  Advised to monitor for drowsiness with the gabapentin.  If he is excessively drowsy after any dose he will discontinue use of this and let us know.  He will follow-up with me in 4 weeks.  He can continue Naprosyn and Tylenol as he has been taking.  We will need to monitor his kidney function on lab work.  I will put this in his home health order and see if they can draw labs on him.  He will discontinue hydrocodone and methocarbamol.

## 2023-10-01 NOTE — Progress Notes (Signed)
Virtual Visit via telephone Note  I connected with Patrick Patel today at  3:40 PM EDT by telephone and verified that I am speaking with the correct person using two identifiers. Location patient: home Location provider: work  Persons participating in the virtual visit: patient, provider  I discussed the limitations, risks, security and privacy concerns of performing an evaluation and management service by telephone and the availability of in person appointments. I also discussed with the patient that there may be a patient responsible charge related to this service. The patient expressed understanding and agreed to proceed.  Interactive audio and video telecommunications were attempted between this provider and patient, however failed, due to the patient not having access to video capability.  We continued and completed visit with audio only.   Reason for visit: f/u.  HPI:  Right leg pain: Patient notes this has been going on for about 5 weeks.  He has right buttock pain radiating into his right thigh.  He went to the emergency room and had a negative ultrasound and right hip x-ray with no fracture or dislocation.  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  They gave him a prednisone taper and hydrocodone.  Patient notes neither of those things were helpful.  He notes the most beneficial thing has been naproxen 2 tablets in the morning along with Tylenol 1000 mg in the morning.  He will take 1 tablet of naproxen later in the day.  He does report chronic right hip pain that has been bothering him for 10 to 12 years.  This worsened 4 to 5 weeks ago.  He notes his discomfort is much better when he is laying down.  He notes no weakness.  He has numbness in his bilateral legs distal to his knees that is chronic.  Notes if he stands up for more than 15 seconds he has significant pain.  ROS: See pertinent positives and negatives per HPI.  Past Medical History:   Diagnosis Date   Diabetes (HCC)    Hyperlipidemia    Hypertension     History reviewed. No pertinent surgical history.  Family History  Problem Relation Age of Onset   Breast cancer Mother    Thyroid cancer Mother      Current Outpatient Medications:    Accu-Chek Softclix Lancets lancets, Use as instructedUSE TO CHECK BLOOD GLUCOSE 3 TIMES DAILY, Disp: 300 each, Rfl: 2   acetaminophen (TYLENOL) 500 MG tablet, Take 1,000 mg by mouth 2 (two) times daily at 10 AM and 5 PM. , Disp: , Rfl:    alfuzosin (UROXATRAL) 10 MG 24 hr tablet, TAKE 1 TABLET BY MOUTH DAILY  WITH BREAKFAST, Disp: 100 tablet, Rfl: 2   blood glucose meter kit and supplies KIT, Dispense Accu-Chek Aviva plus. Check once daily as directed. For ICD 10 code E11.9., Disp: 1 each, Rfl: 0   Blood Glucose Monitoring Suppl DEVI, 1 each by Does not apply route daily before breakfast. May substitute to any manufacturer covered by patient's insurance., Disp: 1 each, Rfl: 0   Calcium-Magnesium-Zinc 167-83-8 MG TABS, Take 4 tablets by mouth daily., Disp: , Rfl:    chlorthalidone (HYGROTON) 50 MG tablet, TAKE 1 TABLET BY MOUTH DAILY, Disp: 100 tablet, Rfl: 2   Continuous Blood Gluc Receiver (FREESTYLE LIBRE 2 READER) DEVI, Use at least every 8 hours to read your blood glucose, Disp: 1 each, Rfl: 0   Continuous Blood Gluc Sensor (FREESTYLE LIBRE 14 DAY SENSOR) MISC, Apply  every 14 days, used to check glucose at least 3 times a day, diagnosis code E11.9, Disp: 6 each, Rfl: 1   diphenhydrAMINE (BENADRYL) 25 MG tablet, Take 50 mg by mouth at bedtime as needed for sleep., Disp: , Rfl:    Emollient (EUCERIN) lotion, Apply topically as needed for dry skin., Disp: 480 mL, Rfl: 3   famotidine (PEPCID) 20 MG tablet, TAKE 1 TABLET BY MOUTH AT  BEDTIME, Disp: 100 tablet, Rfl: 2   gabapentin (NEURONTIN) 100 MG capsule, Take 2 capsules (200 mg total) by mouth 3 (three) times daily., Disp: 180 capsule, Rfl: 2   glipiZIDE (GLUCOTROL) 10 MG tablet, TAKE  2 TABLETS BY MOUTH TWICE  DAILY, Disp: 400 tablet, Rfl: 2   insulin NPH Human (HUMULIN N) 100 UNIT/ML injection, INJECT SUBCUTANEOUSLY 20  UNITS IN THE MORNING AND 15 UNITS AT BEDTIME, Disp: 40 mL, Rfl: 3   Insulin Syringe-Needle U-100 (BD INSULIN SYRINGE U/F) 31G X 5/16" 0.3 ML MISC, USE TWICE DAILY WITH INSULIN, Disp: 200 each, Rfl: 1   ketoconazole (NIZORAL) 2 % cream, Apply 1 Application topically daily., Disp: 15 g, Rfl: 0   lidocaine (LIDODERM) 5 %, Place 1 patch onto the skin every 12 (twelve) hours. Remove & Discard patch within 12 hours or as directed by MD, Disp: 20 patch, Rfl: 0   metFORMIN (GLUCOPHAGE-XR) 500 MG 24 hr tablet, TAKE 4 TABLETS BY MOUTH ONCE  DAILY WITH BREAKFAST, Disp: 400 tablet, Rfl: 2   Multiple Vitamins-Minerals (MULTIVITAMIN WITH MINERALS) tablet, Take 2 tablets by mouth daily., Disp: , Rfl:    Omega-3 Fatty Acids (FISH OIL) 1000 MG CPDR, Take 4 capsules by mouth every other day., Disp: , Rfl:    rosuvastatin (CRESTOR) 20 MG tablet, Take 1 tablet (20 mg total) by mouth daily., Disp: 90 tablet, Rfl: 1   triamcinolone cream (KENALOG) 0.1 %, APPLY TO AFFECTED AREA(S)  TOPICALLY TWICE DAILY AS NEEDED, Disp: 45 g, Rfl: 0  EXAM: This was a telephone visit and thus no exam was completed.  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  We discussed narcotics are not typical treatment for this type of pain.  Discussed referral for home health physical therapy and nursing to evaluate further.  Discussed a trial of gabapentin 200 mg 3 times daily.  Advised to monitor for drowsiness with the gabapentin.  If he is excessively drowsy after any dose he will discontinue use of this and let us know.  He will follow-up with me in 4 weeks.  He can continue Naprosyn and Tylenol as he has been taking.  We will need to monitor his kidney function on  lab work.  I will put this in his home health order and see if they can draw labs on him.  He will discontinue hydrocodone and methocarbamol.      Relevant Medications   gabapentin (NEURONTIN) 100 MG capsule   Other Relevant Orders   Ambulatory referral to Home Health    Return in about 4 weeks (around 10/29/2023).   I discussed the assessment and treatment plan with the patient. The patient was provided an opportunity to ask questions and all were answered. The patient agreed with the plan and demonstrated an understanding of the instructions.   The patient was advised to call back or seek an in-person evaluation if the symptoms worsen or  if the condition fails to improve as anticipated.  I provided 17 minutes of non-face-to-face time during this encounter.   Marikay Alar, MD

## 2023-10-02 ENCOUNTER — Telehealth: Payer: Self-pay | Admitting: Family Medicine

## 2023-10-02 NOTE — Telephone Encounter (Signed)
Please contact the patient.  Please let him know that home health advised [place] that he needs a video visit or an in person visit for his insurance to pay for home health to come out to his house.  He needs to be scheduled for an in person visit or figure out how to do a video visit so that we can get him home health services.

## 2023-10-02 NOTE — Telephone Encounter (Signed)
The first available is 10/30 is that okay?

## 2023-10-04 NOTE — Telephone Encounter (Signed)
Patient is scheduled for Monday 10/08/23.

## 2023-10-05 ENCOUNTER — Other Ambulatory Visit: Payer: Self-pay | Admitting: Orthopedic Surgery

## 2023-10-05 DIAGNOSIS — M5416 Radiculopathy, lumbar region: Secondary | ICD-10-CM

## 2023-10-08 ENCOUNTER — Encounter: Payer: Self-pay | Admitting: Family Medicine

## 2023-10-08 ENCOUNTER — Telehealth (INDEPENDENT_AMBULATORY_CARE_PROVIDER_SITE_OTHER): Payer: 59 | Admitting: Family Medicine

## 2023-10-08 DIAGNOSIS — R634 Abnormal weight loss: Secondary | ICD-10-CM | POA: Diagnosis not present

## 2023-10-08 DIAGNOSIS — Z794 Long term (current) use of insulin: Secondary | ICD-10-CM | POA: Diagnosis not present

## 2023-10-08 DIAGNOSIS — E1140 Type 2 diabetes mellitus with diabetic neuropathy, unspecified: Secondary | ICD-10-CM | POA: Diagnosis not present

## 2023-10-08 DIAGNOSIS — G629 Polyneuropathy, unspecified: Secondary | ICD-10-CM

## 2023-10-08 DIAGNOSIS — M5431 Sciatica, right side: Secondary | ICD-10-CM | POA: Diagnosis not present

## 2023-10-08 NOTE — Progress Notes (Unsigned)
Virtual Visit via video Note  I connected with Patrick Patel today at  3:40 PM EDT by a video enabled telemedicine application and verified that I am speaking with the correct person using two identifiers. Location patient: home Location provider: work Persons participating in the virtual visit: patient, provider  I discussed the limitations, risks, security and privacy concerns of performing an evaluation and management service by telephone and the availability of in person appointments. I also discussed with the patient that there may be a patient responsible charge related to this service. The patient expressed understanding and agreed to proceed.  Interactive audio and video telecommunications were attempted between this provider and patient, however failed, due to patient having technical difficulties OR patient did not have access to video capability.  We continued and completed visit with audio only. ***  Reason for visit: ***  HPI: ***   ROS: See pertinent positives and negatives per HPI.  Past Medical History:  Diagnosis Date   Diabetes (HCC)    Hyperlipidemia    Hypertension     No past surgical history on file.  Family History  Problem Relation Age of Onset   Breast cancer Mother    Thyroid cancer Mother     SOCIAL HX: ***   Current Outpatient Medications:    Accu-Chek Softclix Lancets lancets, Use as instructedUSE TO CHECK BLOOD GLUCOSE 3 TIMES DAILY, Disp: 300 each, Rfl: 2   acetaminophen (TYLENOL) 500 MG tablet, Take 1,000 mg by mouth 2 (two) times daily at 10 AM and 5 PM. , Disp: , Rfl:    alfuzosin (UROXATRAL) 10 MG 24 hr tablet, TAKE 1 TABLET BY MOUTH DAILY  WITH BREAKFAST, Disp: 100 tablet, Rfl: 2   blood glucose meter kit and supplies KIT, Dispense Accu-Chek Aviva plus. Check once daily as directed. For ICD 10 code E11.9., Disp: 1 each, Rfl: 0   Blood Glucose Monitoring Suppl DEVI, 1 each by Does not apply route daily before breakfast. May substitute  to any manufacturer covered by patient's insurance., Disp: 1 each, Rfl: 0   Calcium-Magnesium-Zinc 167-83-8 MG TABS, Take 4 tablets by mouth daily., Disp: , Rfl:    chlorthalidone (HYGROTON) 50 MG tablet, TAKE 1 TABLET BY MOUTH DAILY, Disp: 100 tablet, Rfl: 2   Continuous Blood Gluc Receiver (FREESTYLE LIBRE 2 READER) DEVI, Use at least every 8 hours to read your blood glucose, Disp: 1 each, Rfl: 0   Continuous Blood Gluc Sensor (FREESTYLE LIBRE 14 DAY SENSOR) MISC, Apply every 14 days, used to check glucose at least 3 times a day, diagnosis code E11.9, Disp: 6 each, Rfl: 1   diphenhydrAMINE (BENADRYL) 25 MG tablet, Take 50 mg by mouth at bedtime as needed for sleep., Disp: , Rfl:    Emollient (EUCERIN) lotion, Apply topically as needed for dry skin., Disp: 480 mL, Rfl: 3   famotidine (PEPCID) 20 MG tablet, TAKE 1 TABLET BY MOUTH AT  BEDTIME, Disp: 100 tablet, Rfl: 2   gabapentin (NEURONTIN) 100 MG capsule, Take 2 capsules (200 mg total) by mouth 3 (three) times daily., Disp: 180 capsule, Rfl: 2   glipiZIDE (GLUCOTROL) 10 MG tablet, TAKE 2 TABLETS BY MOUTH TWICE  DAILY, Disp: 400 tablet, Rfl: 2   insulin NPH Human (HUMULIN N) 100 UNIT/ML injection, INJECT SUBCUTANEOUSLY 20  UNITS IN THE MORNING AND 15 UNITS AT BEDTIME, Disp: 40 mL, Rfl: 3   Insulin Syringe-Needle U-100 (BD INSULIN SYRINGE U/F) 31G X 5/16" 0.3 ML MISC, USE TWICE DAILY WITH INSULIN,  Disp: 200 each, Rfl: 1   ketoconazole (NIZORAL) 2 % cream, Apply 1 Application topically daily., Disp: 15 g, Rfl: 0   lidocaine (LIDODERM) 5 %, Place 1 patch onto the skin every 12 (twelve) hours. Remove & Discard patch within 12 hours or as directed by MD, Disp: 20 patch, Rfl: 0   metFORMIN (GLUCOPHAGE-XR) 500 MG 24 hr tablet, TAKE 4 TABLETS BY MOUTH ONCE  DAILY WITH BREAKFAST, Disp: 400 tablet, Rfl: 2   Multiple Vitamins-Minerals (MULTIVITAMIN WITH MINERALS) tablet, Take 2 tablets by mouth daily., Disp: , Rfl:    Omega-3 Fatty Acids (FISH OIL) 1000 MG  CPDR, Take 4 capsules by mouth every other day., Disp: , Rfl:    rosuvastatin (CRESTOR) 20 MG tablet, Take 1 tablet (20 mg total) by mouth daily., Disp: 90 tablet, Rfl: 1   triamcinolone cream (KENALOG) 0.1 %, APPLY TO AFFECTED AREA(S)  TOPICALLY TWICE DAILY AS NEEDED, Disp: 45 g, Rfl: 0  EXAM:  VITALS per patient if applicable:  GENERAL: alert, oriented, appears well and in no acute distress  HEENT: atraumatic, conjunttiva clear, no obvious abnormalities on inspection of external nose and ears  NECK: normal movements of the head and neck  LUNGS: on inspection no signs of respiratory distress, breathing rate appears normal, no obvious gross SOB, gasping or wheezing  CV: no obvious cyanosis  MS: moves all visible extremities without noticeable abnormality  PSYCH/NEURO: pleasant and cooperative, no obvious depression or anxiety, speech and thought processing grossly intact  ASSESSMENT AND PLAN:  Discussed the following assessment and plan:  Problem List Items Addressed This Visit   None   No follow-ups on file.   I discussed the assessment and treatment plan with the patient. The patient was provided an opportunity to ask questions and all were answered. The patient agreed with the plan and demonstrated an understanding of the instructions.   The patient was advised to call back or seek an in-person evaluation if the symptoms worsen or if the condition fails to improve as anticipated.  I provided *** minutes of non-face-to-face time during this encounter.   Marikay Alar, MD

## 2023-10-09 DIAGNOSIS — G629 Polyneuropathy, unspecified: Secondary | ICD-10-CM | POA: Insufficient documentation

## 2023-10-09 DIAGNOSIS — R634 Abnormal weight loss: Secondary | ICD-10-CM | POA: Insufficient documentation

## 2023-10-09 NOTE — Assessment & Plan Note (Signed)
Chronic issue.  Patient was unable to tolerate the gabapentin.  Patient would like to stick with his current regimen of hydrocodone in the morning, Aleve, and Tylenol.  Advised not to take any more Tylenol than he has been taking.  We will try to get him set up with home health physical therapy to treat this issue.

## 2023-10-09 NOTE — Assessment & Plan Note (Signed)
Patient reports reduced appetite for some time with weight loss associated with this.  Weight has stabilized and has actually trended up per his report.  His appetite has been improving.  Patient will monitor his weight and if it starts to go down again we can consider further evaluation.  Discussed that is very possible that he had some other illness on top of having the leg pain.

## 2023-10-09 NOTE — Assessment & Plan Note (Signed)
Suspect this is related to the patient's diabetes.  Discussed we need to check his A1c to check on his diabetic control.  Also would consider getting a B12 level.  We will see if home health can draw labs on him.

## 2023-10-09 NOTE — Assessment & Plan Note (Signed)
Chronic issue.  Patient's glucose has been low.  He will stop glipizide.  He will continue NPH 25 units in the morning.  He will also continue metformin XR 2000 mg daily.  We will see if home health can get labs on him given his inability to come to the office.

## 2023-10-11 ENCOUNTER — Telehealth: Payer: Self-pay

## 2023-10-11 ENCOUNTER — Ambulatory Visit: Admission: RE | Admit: 2023-10-11 | Payer: 59 | Source: Ambulatory Visit

## 2023-10-11 NOTE — Telephone Encounter (Signed)
Rhys Martini called from [facility] to state they are going out on 10/16/2023 to open patient to skilled nursing and physical therapy services.

## 2023-10-15 ENCOUNTER — Other Ambulatory Visit: Payer: Self-pay | Admitting: Orthopedic Surgery

## 2023-10-15 DIAGNOSIS — M25551 Pain in right hip: Secondary | ICD-10-CM

## 2023-10-16 ENCOUNTER — Telehealth: Payer: Self-pay | Admitting: Family Medicine

## 2023-10-16 DIAGNOSIS — Z79891 Long term (current) use of opiate analgesic: Secondary | ICD-10-CM | POA: Diagnosis not present

## 2023-10-16 DIAGNOSIS — Z9181 History of falling: Secondary | ICD-10-CM | POA: Diagnosis not present

## 2023-10-16 DIAGNOSIS — Z79899 Other long term (current) drug therapy: Secondary | ICD-10-CM | POA: Diagnosis not present

## 2023-10-16 DIAGNOSIS — E785 Hyperlipidemia, unspecified: Secondary | ICD-10-CM | POA: Diagnosis not present

## 2023-10-16 DIAGNOSIS — E1140 Type 2 diabetes mellitus with diabetic neuropathy, unspecified: Secondary | ICD-10-CM | POA: Diagnosis not present

## 2023-10-16 DIAGNOSIS — R634 Abnormal weight loss: Secondary | ICD-10-CM | POA: Diagnosis not present

## 2023-10-16 DIAGNOSIS — H8123 Vestibular neuronitis, bilateral: Secondary | ICD-10-CM | POA: Diagnosis not present

## 2023-10-16 DIAGNOSIS — M5431 Sciatica, right side: Secondary | ICD-10-CM | POA: Diagnosis not present

## 2023-10-16 DIAGNOSIS — K5900 Constipation, unspecified: Secondary | ICD-10-CM | POA: Diagnosis not present

## 2023-10-16 DIAGNOSIS — I10 Essential (primary) hypertension: Secondary | ICD-10-CM | POA: Diagnosis not present

## 2023-10-16 DIAGNOSIS — Z7984 Long term (current) use of oral hypoglycemic drugs: Secondary | ICD-10-CM | POA: Diagnosis not present

## 2023-10-16 DIAGNOSIS — Z87891 Personal history of nicotine dependence: Secondary | ICD-10-CM | POA: Diagnosis not present

## 2023-10-16 DIAGNOSIS — G8929 Other chronic pain: Secondary | ICD-10-CM | POA: Diagnosis not present

## 2023-10-16 DIAGNOSIS — Z794 Long term (current) use of insulin: Secondary | ICD-10-CM | POA: Diagnosis not present

## 2023-10-16 NOTE — Telephone Encounter (Signed)
Bonita Quin called from [facility] to state to give [place] their fax number.  Bonita Quin states when we fax the orders, she would like for [place] to add a referral for Child psychotherapist.  Bonita Quin states this is not in the original faxed request.    Bonita Quin states her fax number is:  [phone].

## 2023-10-16 NOTE — Telephone Encounter (Signed)
[facility] just called and said they will start seeing the patient once a week. They also asked can they get some lab orders sent to them. The patient was asking about lab work. The number is [phone] and her name is Patrick Patel. She just needs verbal orders for his labs.

## 2023-10-16 NOTE — Telephone Encounter (Signed)
Can you please give verbal orders for an A1c, CMET, lipid panel, and urine microalbumin/creatinine ratio all for a diagnosis of diabetes type 2? It is ok to give a verbal order for a Child psychotherapist as well. Thanks.

## 2023-10-17 NOTE — Telephone Encounter (Signed)
Verbal orders given spoke to [facility]

## 2023-10-18 ENCOUNTER — Ambulatory Visit
Admission: RE | Admit: 2023-10-18 | Discharge: 2023-10-18 | Disposition: A | Discharge status: Home / Self Care | Payer: 59 | Source: Ambulatory Visit | Attending: Orthopedic Surgery | Admitting: Orthopedic Surgery

## 2023-10-18 DIAGNOSIS — M5136 Other intervertebral disc degeneration, lumbar region with discogenic back pain only: Secondary | ICD-10-CM | POA: Diagnosis not present

## 2023-10-18 DIAGNOSIS — R69 Illness, unspecified: Secondary | ICD-10-CM | POA: Diagnosis not present

## 2023-10-18 DIAGNOSIS — M25551 Pain in right hip: Secondary | ICD-10-CM | POA: Diagnosis not present

## 2023-10-18 DIAGNOSIS — M5416 Radiculopathy, lumbar region: Secondary | ICD-10-CM

## 2023-10-18 DIAGNOSIS — M5137 Other intervertebral disc degeneration, lumbosacral region with discogenic back pain only: Secondary | ICD-10-CM | POA: Diagnosis not present

## 2023-10-18 DIAGNOSIS — M47816 Spondylosis without myelopathy or radiculopathy, lumbar region: Secondary | ICD-10-CM | POA: Diagnosis not present

## 2023-10-18 DIAGNOSIS — M5127 Other intervertebral disc displacement, lumbosacral region: Secondary | ICD-10-CM | POA: Diagnosis not present

## 2023-10-18 DIAGNOSIS — S76311A Strain of muscle, fascia and tendon of the posterior muscle group at thigh level, right thigh, initial encounter: Secondary | ICD-10-CM | POA: Diagnosis not present

## 2023-10-18 DIAGNOSIS — M948X5 Other specified disorders of cartilage, thigh: Secondary | ICD-10-CM | POA: Diagnosis not present

## 2023-10-18 DIAGNOSIS — Z743 Need for continuous supervision: Secondary | ICD-10-CM | POA: Diagnosis not present

## 2023-10-19 DIAGNOSIS — M5416 Radiculopathy, lumbar region: Secondary | ICD-10-CM | POA: Diagnosis not present

## 2023-10-19 DIAGNOSIS — M545 Low back pain, unspecified: Secondary | ICD-10-CM | POA: Diagnosis not present

## 2023-10-19 DIAGNOSIS — R69 Illness, unspecified: Secondary | ICD-10-CM | POA: Diagnosis not present

## 2023-10-19 DIAGNOSIS — Z743 Need for continuous supervision: Secondary | ICD-10-CM | POA: Diagnosis not present

## 2023-10-26 DIAGNOSIS — I10 Essential (primary) hypertension: Secondary | ICD-10-CM | POA: Diagnosis not present

## 2023-10-26 DIAGNOSIS — Z79891 Long term (current) use of opiate analgesic: Secondary | ICD-10-CM | POA: Diagnosis not present

## 2023-10-26 DIAGNOSIS — M5431 Sciatica, right side: Secondary | ICD-10-CM | POA: Diagnosis not present

## 2023-10-26 DIAGNOSIS — E1140 Type 2 diabetes mellitus with diabetic neuropathy, unspecified: Secondary | ICD-10-CM | POA: Diagnosis not present

## 2023-10-26 DIAGNOSIS — Z7984 Long term (current) use of oral hypoglycemic drugs: Secondary | ICD-10-CM | POA: Diagnosis not present

## 2023-10-26 DIAGNOSIS — K5900 Constipation, unspecified: Secondary | ICD-10-CM | POA: Diagnosis not present

## 2023-10-26 DIAGNOSIS — H8123 Vestibular neuronitis, bilateral: Secondary | ICD-10-CM | POA: Diagnosis not present

## 2023-10-26 DIAGNOSIS — E785 Hyperlipidemia, unspecified: Secondary | ICD-10-CM | POA: Diagnosis not present

## 2023-10-26 DIAGNOSIS — G8929 Other chronic pain: Secondary | ICD-10-CM | POA: Diagnosis not present

## 2023-10-26 DIAGNOSIS — R634 Abnormal weight loss: Secondary | ICD-10-CM | POA: Diagnosis not present

## 2023-10-26 DIAGNOSIS — Z794 Long term (current) use of insulin: Secondary | ICD-10-CM | POA: Diagnosis not present

## 2023-10-26 DIAGNOSIS — Z79899 Other long term (current) drug therapy: Secondary | ICD-10-CM | POA: Diagnosis not present

## 2023-10-31 ENCOUNTER — Telehealth: Payer: Self-pay

## 2023-10-31 NOTE — Telephone Encounter (Signed)
Attempted to call pt, phone number listed is not a working phone number.  Had Patrick Patel, sister listed as an alternate contact.  Let a message asking her to call office concerning her brother Patrick Patel.  Need to know if she has another phone number for brother.

## 2023-10-31 NOTE — Telephone Encounter (Signed)
Bonita Quin called from [facility] to states they admitted patient at the end of October.  Bonita Quin states they have been trying to reach patient because his provider would like to have blood work but there is no answer and when they go to his house, no one answers the door.  Bonita Quin states patient declined physical therapy.  Bonita Quin states they may have to discharge patient for non-compliance.

## 2023-11-05 DIAGNOSIS — M1711 Unilateral primary osteoarthritis, right knee: Secondary | ICD-10-CM | POA: Diagnosis not present

## 2023-11-07 ENCOUNTER — Other Ambulatory Visit: Payer: Self-pay | Admitting: Family Medicine

## 2023-11-07 DIAGNOSIS — Z87891 Personal history of nicotine dependence: Secondary | ICD-10-CM | POA: Diagnosis not present

## 2023-11-07 DIAGNOSIS — Z9181 History of falling: Secondary | ICD-10-CM | POA: Diagnosis not present

## 2023-11-07 DIAGNOSIS — Z794 Long term (current) use of insulin: Secondary | ICD-10-CM | POA: Diagnosis not present

## 2023-11-07 DIAGNOSIS — Z7984 Long term (current) use of oral hypoglycemic drugs: Secondary | ICD-10-CM | POA: Diagnosis not present

## 2023-11-07 DIAGNOSIS — I10 Essential (primary) hypertension: Secondary | ICD-10-CM | POA: Diagnosis not present

## 2023-11-07 DIAGNOSIS — Z79891 Long term (current) use of opiate analgesic: Secondary | ICD-10-CM | POA: Diagnosis not present

## 2023-11-07 DIAGNOSIS — G8929 Other chronic pain: Secondary | ICD-10-CM | POA: Diagnosis not present

## 2023-11-07 DIAGNOSIS — M5431 Sciatica, right side: Secondary | ICD-10-CM | POA: Diagnosis not present

## 2023-11-07 DIAGNOSIS — H8123 Vestibular neuronitis, bilateral: Secondary | ICD-10-CM | POA: Diagnosis not present

## 2023-11-07 DIAGNOSIS — E785 Hyperlipidemia, unspecified: Secondary | ICD-10-CM | POA: Diagnosis not present

## 2023-11-07 DIAGNOSIS — R634 Abnormal weight loss: Secondary | ICD-10-CM | POA: Diagnosis not present

## 2023-11-07 DIAGNOSIS — Z79899 Other long term (current) drug therapy: Secondary | ICD-10-CM | POA: Diagnosis not present

## 2023-11-07 DIAGNOSIS — E1140 Type 2 diabetes mellitus with diabetic neuropathy, unspecified: Secondary | ICD-10-CM | POA: Diagnosis not present

## 2023-11-07 DIAGNOSIS — K5900 Constipation, unspecified: Secondary | ICD-10-CM | POA: Diagnosis not present

## 2023-11-11 ENCOUNTER — Other Ambulatory Visit: Payer: Self-pay | Admitting: Family Medicine

## 2023-11-12 ENCOUNTER — Encounter: Payer: Self-pay | Admitting: Family Medicine

## 2023-11-12 LAB — LAB REPORT - SCANNED
A1c: 7.5
Albumin, Urine POC: 66.7
Creatinine, POC: 53 mg/dL
Microalb Creat Ratio: 126

## 2023-11-12 NOTE — Telephone Encounter (Signed)
Patient states [facility] was at his house on Wednesday 11/07/23 and took his blood and stayed for about 45 minutes checking on him. Patient would like a copy of the scanned blood work mailed to him so I printed and put in the mail a copy of his bloodwork.

## 2023-11-12 NOTE — Telephone Encounter (Signed)
Patient states he is returning our call.  I transferred call to Prince Solian, CMA.

## 2023-11-12 NOTE — Telephone Encounter (Signed)
Left message on his answering machine to call the office back.

## 2023-11-20 DIAGNOSIS — K5900 Constipation, unspecified: Secondary | ICD-10-CM | POA: Diagnosis not present

## 2023-11-20 DIAGNOSIS — M5431 Sciatica, right side: Secondary | ICD-10-CM | POA: Diagnosis not present

## 2023-11-20 DIAGNOSIS — Z7984 Long term (current) use of oral hypoglycemic drugs: Secondary | ICD-10-CM | POA: Diagnosis not present

## 2023-11-20 DIAGNOSIS — Z79899 Other long term (current) drug therapy: Secondary | ICD-10-CM | POA: Diagnosis not present

## 2023-11-20 DIAGNOSIS — G8929 Other chronic pain: Secondary | ICD-10-CM | POA: Diagnosis not present

## 2023-11-20 DIAGNOSIS — E785 Hyperlipidemia, unspecified: Secondary | ICD-10-CM | POA: Diagnosis not present

## 2023-11-20 DIAGNOSIS — E1140 Type 2 diabetes mellitus with diabetic neuropathy, unspecified: Secondary | ICD-10-CM | POA: Diagnosis not present

## 2023-11-20 DIAGNOSIS — I10 Essential (primary) hypertension: Secondary | ICD-10-CM | POA: Diagnosis not present

## 2023-11-20 DIAGNOSIS — R634 Abnormal weight loss: Secondary | ICD-10-CM | POA: Diagnosis not present

## 2023-11-20 DIAGNOSIS — Z79891 Long term (current) use of opiate analgesic: Secondary | ICD-10-CM | POA: Diagnosis not present

## 2023-11-20 DIAGNOSIS — Z87891 Personal history of nicotine dependence: Secondary | ICD-10-CM | POA: Diagnosis not present

## 2023-11-20 DIAGNOSIS — H8123 Vestibular neuronitis, bilateral: Secondary | ICD-10-CM | POA: Diagnosis not present

## 2023-11-20 DIAGNOSIS — Z9181 History of falling: Secondary | ICD-10-CM | POA: Diagnosis not present

## 2023-11-20 DIAGNOSIS — Z794 Long term (current) use of insulin: Secondary | ICD-10-CM | POA: Diagnosis not present

## 2024-01-02 ENCOUNTER — Other Ambulatory Visit: Payer: Self-pay | Admitting: Family Medicine

## 2024-01-02 DIAGNOSIS — E1140 Type 2 diabetes mellitus with diabetic neuropathy, unspecified: Secondary | ICD-10-CM

## 2024-01-07 NOTE — Telephone Encounter (Signed)
The patient is no longer supposed to be on glipizide.  Please contact him and make sure he has not been taking this.  Thanks.

## 2024-02-03 ENCOUNTER — Other Ambulatory Visit: Payer: Self-pay | Admitting: Family Medicine

## 2024-02-03 DIAGNOSIS — E1140 Type 2 diabetes mellitus with diabetic neuropathy, unspecified: Secondary | ICD-10-CM

## 2024-02-04 NOTE — Telephone Encounter (Signed)
 This was previously discontinued. Please contact the patient to see if he has still been taking this. Thanks.

## 2024-02-04 NOTE — Telephone Encounter (Signed)
 Called patient twice. First attempt there was a busy tone. Attempted a second time and patient picked up the phone and hung up.

## 2024-02-20 ENCOUNTER — Telehealth: Payer: Self-pay | Admitting: Family Medicine

## 2024-02-20 NOTE — Telephone Encounter (Signed)
 Dr Birdie Sons is no longer at this location. Please call the office to schedule a Transfer of Care to either Dr Charlann Lange, Darleen Crocker or Kara Dies, NP [id number] please scheduled pt.   Thank you

## 2024-02-21 ENCOUNTER — Telehealth: Payer: Self-pay

## 2024-02-21 ENCOUNTER — Encounter: Payer: Self-pay | Admitting: Nurse Practitioner

## 2024-02-21 ENCOUNTER — Telehealth: Payer: Self-pay | Admitting: Family Medicine

## 2024-02-21 NOTE — Telephone Encounter (Signed)
 Pt wants to be scheduled a TOC with Dr. Charlann Lange and stated he will do the UACR at that appt a messaged has been sent the ADMIN pool to have scheduled with

## 2024-02-21 NOTE — Telephone Encounter (Signed)
 Dr. Charlann Lange*

## 2024-02-21 NOTE — Telephone Encounter (Signed)
 Pt would like to be scheduled a TOC appt with Dr. Charlann Lange. Please call pt back to have scheduled

## 2024-03-13 ENCOUNTER — Other Ambulatory Visit: Payer: Self-pay

## 2024-03-13 NOTE — Telephone Encounter (Signed)
 Pt previously with Dr. Birdie Sons has an appointment with Dr. Charlann Lange 04/29/24.  Sending refill to you as Doc of Day.

## 2024-03-14 MED ORDER — CHLORTHALIDONE 50 MG PO TABS: 50.0000 mg | ORAL_TABLET | Freq: Every day | ORAL | 1 refills | Status: DC

## 2024-03-14 NOTE — Telephone Encounter (Signed)
 I have never evaluated the patient. Please schedule an acute medication refill appointment for patient to get his refills.   Thank you,  Patrick Estimable, MD

## 2024-04-13 ENCOUNTER — Other Ambulatory Visit: Payer: Self-pay | Admitting: Internal Medicine

## 2024-04-29 ENCOUNTER — Ambulatory Visit (INDEPENDENT_AMBULATORY_CARE_PROVIDER_SITE_OTHER)

## 2024-04-29 ENCOUNTER — Ambulatory Visit: Payer: Self-pay

## 2024-04-29 ENCOUNTER — Other Ambulatory Visit: Payer: Self-pay

## 2024-04-29 VITALS — BP 120/68 | HR 88 | Temp 98.2°F | Wt 240.0 lb

## 2024-04-29 DIAGNOSIS — I872 Venous insufficiency (chronic) (peripheral): Secondary | ICD-10-CM

## 2024-04-29 DIAGNOSIS — N401 Enlarged prostate with lower urinary tract symptoms: Secondary | ICD-10-CM

## 2024-04-29 DIAGNOSIS — Z794 Long term (current) use of insulin: Secondary | ICD-10-CM

## 2024-04-29 DIAGNOSIS — I10 Essential (primary) hypertension: Secondary | ICD-10-CM | POA: Diagnosis not present

## 2024-04-29 DIAGNOSIS — E1140 Type 2 diabetes mellitus with diabetic neuropathy, unspecified: Secondary | ICD-10-CM

## 2024-04-29 DIAGNOSIS — E785 Hyperlipidemia, unspecified: Secondary | ICD-10-CM

## 2024-04-29 DIAGNOSIS — Z125 Encounter for screening for malignant neoplasm of prostate: Secondary | ICD-10-CM | POA: Diagnosis not present

## 2024-04-29 DIAGNOSIS — B351 Tinea unguium: Secondary | ICD-10-CM | POA: Diagnosis not present

## 2024-04-29 DIAGNOSIS — N3000 Acute cystitis without hematuria: Secondary | ICD-10-CM

## 2024-04-29 DIAGNOSIS — R972 Elevated prostate specific antigen [PSA]: Secondary | ICD-10-CM

## 2024-04-29 DIAGNOSIS — R350 Frequency of micturition: Secondary | ICD-10-CM | POA: Diagnosis not present

## 2024-04-29 DIAGNOSIS — I878 Other specified disorders of veins: Secondary | ICD-10-CM

## 2024-04-29 LAB — POCT URINALYSIS DIPSTICK
Bilirubin, UA: NEGATIVE
Glucose, UA: POSITIVE — AB
Ketones, UA: NEGATIVE
Nitrite, UA: NEGATIVE
Protein, UA: POSITIVE — AB
Spec Grav, UA: 1.025 (ref 1.010–1.025)
Urobilinogen, UA: 0.2 U/dL
pH, UA: 5.5 (ref 5.0–8.0)

## 2024-04-29 MED ORDER — ALFUZOSIN HCL ER 10 MG PO TB24
10.0000 mg | ORAL_TABLET | Freq: Every day | ORAL | 1 refills | Status: DC
Start: 2024-04-29 — End: 2024-06-22

## 2024-04-29 MED ORDER — BLOOD GLUCOSE TEST VI STRP
1.0000 | ORAL_STRIP | Freq: Three times a day (TID) | 11 refills | Status: AC
Start: 2024-04-29 — End: ?

## 2024-04-29 MED ORDER — CIPROFLOXACIN HCL 500 MG PO TABS: 500.0000 mg | ORAL_TABLET | Freq: Two times a day (BID) | ORAL | 0 refills | Status: AC

## 2024-04-29 MED ORDER — INSULIN SYRINGE-NEEDLE U-100 31G X 5/16" 0.3 ML MISC: 1 refills | Status: DC

## 2024-04-29 MED ORDER — ROSUVASTATIN CALCIUM 20 MG PO TABS: 20.0000 mg | ORAL_TABLET | Freq: Every day | ORAL | 3 refills | Status: DC

## 2024-04-29 MED ORDER — BLOOD GLUCOSE MONITORING SUPPL DEVI
1.0000 | Freq: Three times a day (TID) | 0 refills | Status: AC
Start: 2024-04-29 — End: ?

## 2024-04-29 MED ORDER — INSULIN NPH (HUMAN) (ISOPHANE) 100 UNIT/ML ~~LOC~~ SUSP: SUBCUTANEOUS | 2 refills | Status: DC

## 2024-04-29 MED ORDER — HYDROCORTISONE 1 % EX OINT
1.0000 | TOPICAL_OINTMENT | Freq: Two times a day (BID) | CUTANEOUS | 0 refills | Status: AC
Start: 2024-04-29 — End: ?

## 2024-04-29 MED ORDER — METFORMIN HCL ER 500 MG PO TB24: ORAL_TABLET | ORAL | 2 refills | Status: DC

## 2024-04-29 MED ORDER — LANCET DEVICE MISC
1.0000 | Freq: Three times a day (TID) | 11 refills | Status: AC
Start: 2024-04-29 — End: 2024-05-29

## 2024-04-29 MED ORDER — LANCETS MISC. MISC: 1.0000 | Freq: Three times a day (TID) | 11 refills | Status: AC

## 2024-04-29 MED ORDER — CHLORTHALIDONE 50 MG PO TABS: 50.0000 mg | ORAL_TABLET | Freq: Every day | ORAL | 3 refills | Status: DC

## 2024-04-29 NOTE — Assessment & Plan Note (Signed)
 Patient established with podiatry.  He has not followed up with them in few months.  Will discuss new referral versus patient reaching out to his podiatrist to reschedule an appointment.  Patient plans on scheduling appointment with his podiatrist.

## 2024-04-29 NOTE — Assessment & Plan Note (Addendum)
 Chronic issue.  Adequately controlled.  Continue chlorthalidone  50 mg daily. Check CMP today

## 2024-04-29 NOTE — Assessment & Plan Note (Addendum)
 Chronic issue.  Check A1c today. He will continue NPH 25 units in the morning.  He will also continue metformin  XR 2000 mg daily.   If diabetes not within control will adjust treatment. Has not been checking home BG. Requesting device like Accu-check, prescription sent today. Recommend checking home BG 3 times a day.  Due to diabetic neuropathy her foot feels unstable at times and uses walker for ambulation. SDOH referral made today.

## 2024-04-29 NOTE — Assessment & Plan Note (Signed)
 Current urinary symptoms could be related to BPH.  Patient is currently taking alfuzosin  10 mg once a day.  Recommend continuing this.  UA, urine culture ordered.  I encouraged the patient to follow-up with urology.

## 2024-04-29 NOTE — Patient Instructions (Addendum)
--   Schedule for an eye appointment for diabetic eye exam.  -- You are also due for tetanus shot. Please reach out to your local pharmacy to schedule for an appointment.   -- Apply Hydrocortisone 1% cream, twice a day for no more than 10 days for itchy skin on your legs.  -- I have put in referral for vascular surgery department to evaluate the bilateral leg redness, swelling. At the mean time I recommend using compression stockings when you are on your feet. Elevating the legs, walking or exercise can improve venous return and reduce the swelling and redness.  -- You need to see podiatry to help assist in taking care of nails, avoiding diabetic foot ulcer. -- I am reaching out to our pharmacy team to help assist us  with diabetes management they will call you to get more information and make recommendations.

## 2024-04-29 NOTE — Progress Notes (Signed)
 Established patient of Dr. Lovetta Rucks presenting for transfer of care.   Subjective  Patient ID: Patrick Patel, male    DOB: [birth year]  Age: 83 y.o. MRN: [id number]  Chief Complaint  Patient presents with   Transitions Of Care   Dysuria   Urinary Retention   Urinary Frequency   Leg Swelling    He  has a past medical history of Diabetes (HCC), Hyperlipidemia, and Hypertension.  1) Hypertension: Currently on Chlorthalidone  50 mg once a day. Denies chest pain.   2) DM 2: Takes 2,000 mg which takes all of it together in the morning. Reports no diarrhea. Denies thirsty. He reports he was on Glipizide  in the past and was d/c by his previous PCP. He reports he would like to go back on Glipizide . On NPH insulin  25 units right after breakfast. Patient stopped checking blood glucose about 2 months ago. Eye exam through [facility]. Last eye exam was about 4 years. Last A1c was on 03/28/2023 was 7.5%.  3) Urinary frequency: For about 3 weeks. Urine color brown, almost butter like.  He also reports of reduction in volume of urine for about the same time. He denies fever, chills, flank pain. Has seen urologist at [facility] clinic in the past. Not interested in surgery. Is prescribed Alfuzosin  10 mg, once a day.   4) Hyperlipidemia: On Crestor  20 mg once a day.    5) H/O GERD: Used to take Pepcid , has not needed in couple of years.   6) He lives with his sister 33 years of age who has a h/o stroke. Patient does not have a support system at home. Needs Child psychotherapist.   7) Last PSA was on 12/25/2022 was 4.00, ng/ml.   8) Patient does not want to see dentis. Last time he saw a dentis was about 20 years ago. Patient reports of b/l cheek pain bilaterally for about 5 weeks.    As per HPI    Objective:      BP 120/68   Pulse 88   Temp 98.2 F (36.8 C) (Oral)   Wt 240 lb (108.9 kg)   SpO2 97%   BMI 35.44 kg/m      04/29/2024    3:08 PM 10/01/2023    3:45 PM 03/28/2023    4:33 PM   Depression screen PHQ 2/9  Decreased Interest 0 0 0  Down, Depressed, Hopeless 0 0 0  PHQ - 2 Score 0 0 0  Altered sleeping 3 0 0  Tired, decreased energy 0 0 0  Change in appetite 3 0 0  Feeling bad or failure about yourself  0 0 0  Trouble concentrating 0 0 0  Moving slowly or fidgety/restless 0 0 0  Suicidal thoughts 0 0 0  PHQ-9 Score 6 0 0  Difficult doing work/chores Somewhat difficult Not difficult at all Not difficult at all      04/29/2024    3:08 PM 10/01/2023    3:45 PM 03/28/2023    4:33 PM 12/25/2022    3:44 PM  GAD 7 : Generalized Anxiety Score  Nervous, Anxious, on Edge 0 0 0 0  Control/stop worrying 0 0 0 0  Worry too much - different things 0 0 0 0  Trouble relaxing 3 0 0 0  Restless 0 0 0 0  Easily annoyed or irritable 0 0 0 0  Afraid - awful might happen 0 0 0 0  Total GAD 7 Score 3 0 0 0  Anxiety Difficulty Somewhat difficult Not difficult at all Not difficult at all Not difficult at all      04/29/2024    3:08 PM 10/01/2023    3:45 PM 03/28/2023    4:33 PM  Depression screen PHQ 2/9  Decreased Interest 0 0 0  Down, Depressed, Hopeless 0 0 0  PHQ - 2 Score 0 0 0  Altered sleeping 3 0 0  Tired, decreased energy 0 0 0  Change in appetite 3 0 0  Feeling bad or failure about yourself  0 0 0  Trouble concentrating 0 0 0  Moving slowly or fidgety/restless 0 0 0  Suicidal thoughts 0 0 0  PHQ-9 Score 6 0 0  Difficult doing work/chores Somewhat difficult Not difficult at all Not difficult at all      04/29/2024    3:08 PM 10/01/2023    3:45 PM 03/28/2023    4:33 PM 12/25/2022    3:44 PM  GAD 7 : Generalized Anxiety Score  Nervous, Anxious, on Edge 0 0 0 0  Control/stop worrying 0 0 0 0  Worry too much - different things 0 0 0 0  Trouble relaxing 3 0 0 0  Restless 0 0 0 0  Easily annoyed or irritable 0 0 0 0  Afraid - awful might happen 0 0 0 0  Total GAD 7 Score 3 0 0 0  Anxiety Difficulty Somewhat difficult Not difficult at all Not difficult at  all Not difficult at all   SDOH Screenings   Depression (PHQ2-9): Medium Risk (04/29/2024)  Tobacco Use: High Risk (04/29/2024)     Physical Exam Constitutional:      Appearance: He is obese.  HENT:     Head: Normocephalic and atraumatic.     Right Ear: Tympanic membrane normal.     Left Ear: Tympanic membrane normal.     Mouth/Throat:     Mouth: Mucous membranes are moist.     Comments: Patient has tobacco placed in lower buccal mucosa during exam. No obvious oral lesions noted.  Neck:     Thyroid : No thyroid  mass or thyroid  tenderness.  Cardiovascular:     Rate and Rhythm: Normal rate and regular rhythm.  Pulmonary:     Effort: Pulmonary effort is normal.     Breath sounds: Normal breath sounds.  Abdominal:     General: Bowel sounds are normal.     Palpations: Abdomen is soft.     Tenderness: There is no abdominal tenderness. There is no right CVA tenderness, left CVA tenderness or guarding.  Musculoskeletal:     Cervical back: Neck supple. No rigidity.     Comments: B/L lower legs: with mild venous stasis dermatitis, skin appear erythematous, 2+ b/l pitting edema, without ulceration. Does have dry skin worse on the left than the right. No weeping or open wound noted.   Skin:    General: Skin is warm.  Neurological:     Mental Status: He is alert and oriented to person, place, and time.  Psychiatric:        Mood and Affect: Mood normal.        Behavior: Behavior normal. Behavior is cooperative.      Diabetic foot exam was performed with the following findings:   Intact posterior tibialis and dorsalis pedis pulses Patient with b/l onychomycosis of multiple toes, in between toe nails. Reduced monofilament bilaterally on the base of b/l first and third toes     Results for orders placed  or performed in visit on 04/29/24  POCT Urinalysis Dipstick  Result Value Ref Range   Color, UA Brown    Clarity, UA Turbid    Glucose, UA Positive (A) Negative   Bilirubin, UA  Negative    Ketones, UA Negative    Spec Grav, UA 1.025 1.010 - 1.025   Blood, UA Large    pH, UA 5.5 5.0 - 8.0   Protein, UA Positive (A) Negative   Urobilinogen, UA 0.2 0.2 or 1.0 E.U./dL   Nitrite, UA Negative    Leukocytes, UA Large (3+) (A) Negative   Appearance Turbid    Odor      The ASCVD Risk score (Arnett DK, et al., 2019) failed to calculate for the following reasons:   The 2019 ASCVD risk score is only valid for ages 81 to 67    Assessment & Plan:  Type 2 diabetes mellitus with diabetic neuropathy, with long-term current use of insulin  [facility]) Assessment & Plan: Chronic issue.  Check A1c today. He will continue NPH 25 units in the morning.  He will also continue metformin  XR 2000 mg daily.   If diabetes not within control will adjust treatment. Has not been checking home BG. Requesting device like Accu-check, prescription sent today. Recommend checking home BG 3 times a day.  Due to diabetic neuropathy her foot feels unstable at times and uses walker for ambulation. SDOH referral made today.   Orders: -     Hemoglobin A1c -     Comprehensive metabolic panel with GFR; Future -     AMB Referral VBCI Care Management -     Blood Glucose Monitoring Suppl; 1 each by Does not apply route in the morning, at noon, and at bedtime. May substitute to any manufacturer covered by patient's insurance.  Dispense: 1 each; Refill: 0 -     Blood Glucose Test; 1 each by In Vitro route in the morning, at noon, and at bedtime. May substitute to any manufacturer covered by patient's insurance.  Dispense: 90 each; Refill: 11 -     Lancet Device; 1 each by Does not apply route in the morning, at noon, and at bedtime. May substitute to any manufacturer covered by patient's insurance.  Dispense: 1 each; Refill: 11 -     Lancets Misc.; 1 each by Does not apply route in the morning, at noon, and at bedtime. May substitute to any manufacturer covered by patient's insurance.  Dispense: 100 each; Refill:  11 -     Insulin  NPH (Human) (Isophane); INJECT SUBCUTANEOUSLY 20 UNITS  IN THE MORNING AND 15 UNITS AT  BEDTIME  Dispense: 40 mL; Refill: 2 -     Insulin  Syringe-Needle U-100; USE TWICE DAILY WITH INSULIN   Dispense: 200 each; Refill: 1 -     metFORMIN  HCl ER; TAKE 4 TABLETS BY MOUTH  ONCE DAILY WITH BREAKFAST  Dispense: 400 tablet; Refill: 2 -     HM Diabetes Foot Exam  Prostate cancer screening -     PSA, Medicare  Essential hypertension Assessment & Plan: Chronic issue.  Adequately controlled.  Continue chlorthalidone  50 mg daily. Check CMP today  Orders: -     Comprehensive metabolic panel with GFR; Future -     Chlorthalidone ; Take 1 tablet (50 mg total) by mouth daily.  Dispense: 90 tablet; Refill: 3  Hyperlipidemia, unspecified hyperlipidemia type Assessment & Plan: Check lipid panel today.  He will continue Crestor  20 mg daily.   Orders: -  Lipid panel; Future -     Comprehensive metabolic panel with GFR; Future -     Rosuvastatin  Calcium ; Take 1 tablet (20 mg total) by mouth daily.  Dispense: 100 tablet; Refill: 3  Urinary frequency -     Urine Culture -     POCT urinalysis dipstick -     Urinalysis, Routine w reflex microscopic  Venous insufficiency -     Ambulatory referral to Vascular Surgery  Venous stasis dermatitis of both lower extremities -     Ambulatory referral to Vascular Surgery -     Hydrocortisone; Apply 1 Application topically 2 (two) times daily.  Dispense: 30 g; Refill: 0  Morbid obesity (HCC) -     AMB Referral VBCI Care Management  Onychomycosis Assessment & Plan: Patient established with podiatry.  He has not followed up with them in few months.  Will discuss new referral versus patient reaching out to his podiatrist to reschedule an appointment.  Patient plans on scheduling appointment with his podiatrist.    Benign prostatic hyperplasia with urinary frequency Assessment & Plan: Current urinary symptoms could be related to BPH.   Patient is currently taking alfuzosin  10 mg once a day.  Recommend continuing this.  UA, urine culture ordered.  I encouraged the patient to follow-up with urology.   Venous stasis of both lower extremities Assessment & Plan: Chronic.  Reviewed previous documentation from previous PCP.  Strongly encouraged patient to start wearing compression stocking, elevating legs , increasing exercise to help improving symptoms.  Referral to vascular surgery made today.  Try hydrocortisone 1% cream twice a day for no more than 10 days on the pruritic skin.     Other orders -     Alfuzosin  HCl ER; Take 1 tablet (10 mg total) by mouth daily with breakfast.  Dispense: 90 tablet; Refill: 1   Return in about 3 months (around 07/30/2024) for chronic follow up, labs 2 days before appointment.   Jacklin Mascot, MD

## 2024-04-29 NOTE — Assessment & Plan Note (Signed)
 Chronic.  Reviewed previous documentation from previous PCP.  Strongly encouraged patient to start wearing compression stocking, elevating legs , increasing exercise to help improving symptoms.  Referral to vascular surgery made today.  Try hydrocortisone 1% cream twice a day for no more than 10 days on the pruritic skin.

## 2024-04-29 NOTE — Assessment & Plan Note (Signed)
 Check lipid panel today.  He will continue Crestor  20 mg daily.

## 2024-04-30 LAB — URINALYSIS, ROUTINE W REFLEX MICROSCOPIC
Bilirubin Urine: NEGATIVE
Ketones, ur: NEGATIVE
Nitrite: NEGATIVE
Specific Gravity, Urine: 1.025 (ref 1.000–1.030)
Total Protein, Urine: 100 — AB
Urine Glucose: 100 — AB
Urobilinogen, UA: 0.2 (ref 0.0–1.0)
pH: 6 (ref 5.0–8.0)

## 2024-04-30 LAB — COMPREHENSIVE METABOLIC PANEL WITH GFR
ALT: 13 U/L (ref 0–53)
AST: 13 U/L (ref 0–37)
Albumin: 4.1 g/dL (ref 3.5–5.2)
Alkaline Phosphatase: 68 U/L (ref 39–117)
BUN: 25 mg/dL — ABNORMAL HIGH (ref 6–23)
CO2: 23 meq/L (ref 19–32)
Calcium: 9.1 mg/dL (ref 8.4–10.5)
Chloride: 101 meq/L (ref 96–112)
Creatinine, Ser: 0.97 mg/dL (ref 0.40–1.50)
GFR: 73.12 mL/min
Glucose, Bld: 170 mg/dL — ABNORMAL HIGH (ref 70–99)
Potassium: 3.6 meq/L (ref 3.5–5.1)
Sodium: 138 meq/L (ref 135–145)
Total Bilirubin: 0.4 mg/dL (ref 0.2–1.2)
Total Protein: 7 g/dL (ref 6.0–8.3)

## 2024-04-30 LAB — LIPID PANEL
Cholesterol: 80 mg/dL (ref 0–200)
HDL: 25.4 mg/dL — ABNORMAL LOW
LDL Cholesterol: 13 mg/dL (ref 0–99)
NonHDL: 54.86
Total CHOL/HDL Ratio: 3
Triglycerides: 207 mg/dL — ABNORMAL HIGH (ref 0.0–149.0)
VLDL: 41.4 mg/dL — ABNORMAL HIGH (ref 0.0–40.0)

## 2024-04-30 LAB — PSA, MEDICARE: PSA: 5.33 ng/mL — ABNORMAL HIGH (ref 0.10–4.00)

## 2024-04-30 LAB — HEMOGLOBIN A1C: Hgb A1c MFr Bld: 7.7 % — ABNORMAL HIGH (ref 4.6–6.5)

## 2024-04-30 NOTE — Progress Notes (Signed)
 Please let the patient know his PSA is elevated compared to last year. With history of recent urinary tract infection, elevated PSA he should be following up with urology. I am putting a referral to urology department. His HbA1c looks stable so I don't recommend changing his diabetes medication. His cholesterol, kidney, liver function looks stable.   Thank you,  Jacklin Mascot, MD

## 2024-05-01 LAB — URINE CULTURE
MICRO NUMBER:: 16483093
Result:: NO GROWTH
SPECIMEN QUALITY:: ADEQUATE

## 2024-05-02 ENCOUNTER — Telehealth: Payer: Self-pay

## 2024-05-02 NOTE — Telephone Encounter (Signed)
 Any update on reaching out to the patient? His urine culture did not show bacterial growth. His urinary symptoms are likely related to prostatic hyperplasia accompanied by lower urinary tract symptoms from it. He really needs to see an urologist for further evaluation and management. He already has urology referral. I tried calling him but he did not pick up the call.   Thank you,  Jacklin Mascot, MD

## 2024-05-02 NOTE — Progress Notes (Signed)
   Telephone encounter was:  Unsuccessful.  05/02/2024 Name: Patrick Patel MRN: [id number] DOB: 1941-06-19  Unsuccessful outbound call made today to assist with:  Housing and Utilities  Outreach Attempt:  1st Attempt    [name] [facility]  [facility] Guide, Phone: [phone] Fax: [phone] Website: [email or web address]

## 2024-05-06 ENCOUNTER — Telehealth: Payer: Self-pay

## 2024-05-06 NOTE — Progress Notes (Signed)
   Telephone encounter was:  Unsuccessful.  05/06/2024 Name: RAYSHAUN NEEDLE MRN: [id number] DOB: August 09, 1941  Unsuccessful outbound call made today to assist with:  Housing and utilities   Outreach Attempt:  2nd Attempt  Unable to leave a message    [name] [facility]  [facility] Guide, Phone: [phone] Fax: [phone] Website: [email or web address]

## 2024-05-07 ENCOUNTER — Telehealth: Payer: Self-pay

## 2024-05-07 ENCOUNTER — Other Ambulatory Visit: Payer: Self-pay

## 2024-05-07 DIAGNOSIS — N471 Phimosis: Secondary | ICD-10-CM

## 2024-05-07 MED ORDER — KETOCONAZOLE 2 % EX CREA
1.0000 | TOPICAL_CREAM | Freq: Every day | CUTANEOUS | 0 refills | Status: DC
Start: 2024-05-07 — End: 2024-06-04

## 2024-05-07 NOTE — Telephone Encounter (Signed)
 Noted

## 2024-05-07 NOTE — Telephone Encounter (Signed)
 Spoke with patient to follow up on his concerns with medications. Patient was informed during his appt he [facility] and he says he [facility] to [facility] during his discussion with Dr Casimir Cleaver. Patient says he is not able to walk as good with his walker and want it delivered to his door. [facility] was advised [facility] and have [facility] to [facility] and if they can't then let our office know and I can try and get it sent over for her.

## 2024-05-07 NOTE — Telephone Encounter (Signed)
 Copied from CRM [id number]. Topic: Clinical - Lab/Test Results >> May 07, 2024  8:23 AM Bambi Bonine D wrote: Reason for CRM: Pt was informed of his lab results from Dr.Bair and was also provided with the phone number for the referred urology office.

## 2024-05-07 NOTE — Telephone Encounter (Signed)
 Copied from CRM [id number]. Topic: Clinical - Medication Question >> May 07, 2024  8:27 AM Bambi Bonine D wrote: Reason for CRM: Pt stated that his pharmacy ([facility]) has been reaching out to the office to get the medications refilled for the  triamcinolone  cream (KENALOG ) 0.1 %, rosuvastatin  (CRESTOR ) 20 MG tablet, alfuzosin  (UROXATRAL ) 10 MG 24 hr tablet, and the glipiZIDE  (GLUCOTROL ) 10 MG tablet. Pt stated that the pharmacy informed him that if the provider doesn't respond or approve the refill request then they will cancel his order. Pt stated they have reached out multiple times and he would like for the medication to be approved today if possible. Pt would also like a call back with an update on this concern.

## 2024-05-08 ENCOUNTER — Telehealth: Payer: Self-pay

## 2024-05-08 NOTE — Progress Notes (Signed)
   Telephone encounter was:  Unsuccessful.  05/08/2024 Name: MEARLE DREW MRN: [id number] DOB: 02/08/1941  Unsuccessful outbound call made today to assist with:  Housing and Utilities   Outreach Attempt:  3rd Attempt.  Referral closed unable to contact patient.  No Answer, Unable to leave a message    [name] [facility]  [facility] Guide, Phone: [phone] Fax: [phone] Website: [email or web address]

## 2024-05-12 ENCOUNTER — Other Ambulatory Visit: Payer: Self-pay

## 2024-05-12 NOTE — Telephone Encounter (Signed)
 Entered in error

## 2024-05-27 ENCOUNTER — Telehealth: Payer: Self-pay

## 2024-05-27 ENCOUNTER — Ambulatory Visit (INDEPENDENT_AMBULATORY_CARE_PROVIDER_SITE_OTHER): Admitting: Vascular Surgery

## 2024-05-27 NOTE — Telephone Encounter (Signed)
 Spoke with patient to follow up in regards to his medication questions. Patient states he would like to have all his medications moving forward sent to [facility]. [facility] the [facility] of [facility]'s [facility]. Patient picked up medications in May and June. Patient says for his refills, he would like them to be sent to his home. Spoke with [facility] pharmacist and to inform them that patient would like to have [facility] to [facility]. Pharmacist verbalized understanding. Patient was notified.

## 2024-05-27 NOTE — Telephone Encounter (Signed)
 Copied from CRM [id number]. Topic: Clinical - Prescription Issue >> May 27, 2024  3:56 PM [place] E wrote: Reason for CRM: Patient stated that roughly [facility]. Asked patient for the names of the medications that got sent to the wrong pharmacy and he did not know the names. Callback number for patient is [phone].

## 2024-06-04 ENCOUNTER — Other Ambulatory Visit: Payer: Self-pay

## 2024-06-04 DIAGNOSIS — N471 Phimosis: Secondary | ICD-10-CM

## 2024-06-15 ENCOUNTER — Other Ambulatory Visit: Payer: Self-pay

## 2024-06-15 DIAGNOSIS — Z7721 Contact with and (suspected) exposure to potentially hazardous body fluids: Secondary | ICD-10-CM | POA: Diagnosis not present

## 2024-06-15 DIAGNOSIS — T59811A Toxic effect of smoke, accidental (unintentional), initial encounter: Secondary | ICD-10-CM | POA: Insufficient documentation

## 2024-06-15 DIAGNOSIS — J705 Respiratory conditions due to smoke inhalation: Secondary | ICD-10-CM | POA: Diagnosis not present

## 2024-06-15 DIAGNOSIS — E119 Type 2 diabetes mellitus without complications: Secondary | ICD-10-CM | POA: Diagnosis not present

## 2024-06-15 DIAGNOSIS — X001XXA Exposure to smoke in uncontrolled fire in building or structure, initial encounter: Secondary | ICD-10-CM | POA: Insufficient documentation

## 2024-06-15 DIAGNOSIS — Z743 Need for continuous supervision: Secondary | ICD-10-CM | POA: Diagnosis not present

## 2024-06-15 DIAGNOSIS — T7500XA Unspecified effects of lightning, initial encounter: Secondary | ICD-10-CM | POA: Diagnosis not present
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
## 2024-06-15 NOTE — ED Notes (Signed)
Pt given food tray and drinks

## 2024-06-16 ENCOUNTER — Emergency Department
Admission: EM | Admit: 2024-06-16 | Discharge: 2024-06-16 | Disposition: A | Discharge status: Home / Self Care | Attending: Emergency Medicine | Admitting: Emergency Medicine

## 2024-06-16 DIAGNOSIS — E119 Type 2 diabetes mellitus without complications: Secondary | ICD-10-CM

## 2024-06-16 DIAGNOSIS — X001XXA Exposure to smoke in uncontrolled fire in building or structure, initial encounter: Secondary | ICD-10-CM

## 2024-06-16 DIAGNOSIS — J705 Respiratory conditions due to smoke inhalation: Secondary | ICD-10-CM | POA: Diagnosis not present

## 2024-06-16 DIAGNOSIS — E1140 Type 2 diabetes mellitus with diabetic neuropathy, unspecified: Secondary | ICD-10-CM

## 2024-06-16 LAB — CBG MONITORING, ED
Glucose-Capillary: 183 mg/dL — ABNORMAL HIGH (ref 70–99)
Glucose-Capillary: 244 mg/dL — ABNORMAL HIGH (ref 70–99)

## 2024-06-16 MED ORDER — CHLORTHALIDONE 25 MG PO TABS
50.0000 mg | ORAL_TABLET | Freq: Every day | ORAL | Status: DC
  Filled 2024-06-16: qty 2

## 2024-06-16 MED ORDER — INSULIN NPH (HUMAN) (ISOPHANE) 100 UNIT/ML ~~LOC~~ SUSP: SUBCUTANEOUS | 2 refills | Status: DC

## 2024-06-16 MED ORDER — ALFUZOSIN HCL ER 10 MG PO TB24: 10.0000 mg | ORAL_TABLET | Freq: Every day | ORAL | Status: DC

## 2024-06-16 MED ORDER — LOPERAMIDE HCL 2 MG PO CAPS
4.0000 mg | ORAL_CAPSULE | ORAL | Status: DC | PRN
  Administered 2024-06-16: 4 mg via ORAL
  Filled 2024-06-16: qty 2

## 2024-06-16 MED ORDER — ROSUVASTATIN CALCIUM 20 MG PO TABS
20.0000 mg | ORAL_TABLET | Freq: Every day | ORAL | Status: DC
  Filled 2024-06-16: qty 1

## 2024-06-16 MED ORDER — METFORMIN HCL ER 500 MG PO TB24: 1000.0000 mg | ORAL_TABLET | Freq: Every day | ORAL | Status: DC

## 2024-06-16 MED ORDER — INSULIN NPH (HUMAN) (ISOPHANE) 100 UNIT/ML ~~LOC~~ SUSP
20.0000 [IU] | Freq: Every morning | SUBCUTANEOUS | Status: DC
  Administered 2024-06-16: 20 [IU] via SUBCUTANEOUS
  Filled 2024-06-16: qty 10

## 2024-06-16 MED ORDER — INSULIN NPH (HUMAN) (ISOPHANE) 100 UNIT/ML ~~LOC~~ SUSP: 20.0000 [IU] | Freq: Every day | SUBCUTANEOUS | Status: DC

## 2024-06-16 MED ORDER — INSULIN NPH (HUMAN) (ISOPHANE) 100 UNIT/ML ~~LOC~~ SUSP
15.0000 [IU] | Freq: Every day | SUBCUTANEOUS | Status: DC
  Filled 2024-06-16: qty 10

## 2024-06-16 NOTE — ED Notes (Signed)
 This RN updated pt on POC. Menu given. 2 Ginger ales provided. No other needs expressed.
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
## 2024-06-16 NOTE — ED Notes (Signed)
 Bed alarm not in place due to pt living home alone, walks with walker, gate is steady, is able to tend to himself without assistance. This RN observed him ambulating and his gate is sufficient.

## 2024-06-16 NOTE — ED Provider Notes (Signed)
-----------------------------------------   6:10 PM on 06/16/2024 -----------------------------------------   Blood pressure 127/72, pulse 90, temperature 98.5 F (36.9 C), resp. rate 16, height 5' 9 (1.753 m), SpO2 98%.  The patient is calm and cooperative at this time.  Patient has received all of his home medications and wishes to leave at this time.  No medical concerns and will discharge.   Malvina Alm DASEN, MD 06/16/24 [phone]

## 2024-06-16 NOTE — ED Notes (Signed)
 Sheriffs department at bedside for transport.
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
## 2024-06-22 ENCOUNTER — Ambulatory Visit
Admission: RE | Admit: 2024-06-22 | Discharge: 2024-06-22 | Disposition: A | Discharge status: Home / Self Care | Attending: Emergency Medicine | Admitting: Emergency Medicine

## 2024-06-22 VITALS — BP 113/67 | HR 80 | Temp 97.8°F | Resp 18

## 2024-06-22 DIAGNOSIS — R319 Hematuria, unspecified: Secondary | ICD-10-CM | POA: Insufficient documentation

## 2024-06-22 DIAGNOSIS — L03031 Cellulitis of right toe: Secondary | ICD-10-CM | POA: Insufficient documentation

## 2024-06-22 DIAGNOSIS — E785 Hyperlipidemia, unspecified: Secondary | ICD-10-CM | POA: Insufficient documentation

## 2024-06-22 DIAGNOSIS — Z794 Long term (current) use of insulin: Secondary | ICD-10-CM | POA: Diagnosis not present

## 2024-06-22 DIAGNOSIS — K121 Other forms of stomatitis: Secondary | ICD-10-CM | POA: Diagnosis not present

## 2024-06-22 DIAGNOSIS — Z760 Encounter for issue of repeat prescription: Secondary | ICD-10-CM | POA: Insufficient documentation

## 2024-06-22 DIAGNOSIS — N390 Urinary tract infection, site not specified: Secondary | ICD-10-CM | POA: Insufficient documentation

## 2024-06-22 DIAGNOSIS — E1140 Type 2 diabetes mellitus with diabetic neuropathy, unspecified: Secondary | ICD-10-CM | POA: Diagnosis not present

## 2024-06-22 DIAGNOSIS — I10 Essential (primary) hypertension: Secondary | ICD-10-CM | POA: Insufficient documentation

## 2024-06-22 LAB — POCT URINALYSIS DIP (MANUAL ENTRY)
Bilirubin, UA: NEGATIVE
Glucose, UA: NEGATIVE mg/dL
Ketones, POC UA: NEGATIVE mg/dL
Nitrite, UA: NEGATIVE
Protein Ur, POC: >=300 mg/dL — AB
Spec Grav, UA: 1.025 (ref 1.010–1.025)
Urobilinogen, UA: 0.2 U/dL
pH, UA: 6 (ref 5.0–8.0)

## 2024-06-22 MED ORDER — CHLORTHALIDONE 50 MG PO TABS: 50.0000 mg | ORAL_TABLET | Freq: Every day | ORAL | 0 refills | Status: DC

## 2024-06-22 MED ORDER — TRIAMCINOLONE ACETONIDE 0.1 % EX CREA: TOPICAL_CREAM | Freq: Two times a day (BID) | CUTANEOUS | 0 refills | Status: AC

## 2024-06-22 MED ORDER — LIDOCAINE VISCOUS HCL 2 % MT SOLN: 5.0000 mL | Freq: Four times a day (QID) | OROMUCOSAL | 0 refills | Status: AC | PRN

## 2024-06-22 MED ORDER — ROSUVASTATIN CALCIUM 20 MG PO TABS
20.0000 mg | ORAL_TABLET | Freq: Every day | ORAL | 0 refills | Status: AC
Start: 2024-06-22 — End: ?

## 2024-06-22 MED ORDER — INSULIN SYRINGE-NEEDLE U-100 31G X 5/16" 0.3 ML MISC: 1 refills | Status: AC

## 2024-06-22 MED ORDER — METFORMIN HCL ER 500 MG PO TB24
ORAL_TABLET | ORAL | 0 refills | Status: DC
Start: 2024-06-22 — End: 2024-07-23

## 2024-06-22 MED ORDER — DOXYCYCLINE HYCLATE 100 MG PO CAPS: 100.0000 mg | ORAL_CAPSULE | Freq: Two times a day (BID) | ORAL | 0 refills | Status: AC

## 2024-06-22 MED ORDER — GLIPIZIDE 10 MG PO TABS: 10.0000 mg | ORAL_TABLET | Freq: Every day | ORAL | 0 refills | Status: DC

## 2024-06-22 MED ORDER — ALFUZOSIN HCL ER 10 MG PO TB24: 10.0000 mg | ORAL_TABLET | Freq: Every day | ORAL | 0 refills | Status: DC
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
## 2024-06-22 NOTE — Discharge Instructions (Addendum)
 Follow-up with your podiatrist tomorrow morning.  Call for the soonest available appointment.    Follow-up with your primary care provider tomorrow.  Call for the soonest available appointment.    A 30-day supply has been sent to your pharmacy for the medications requested.  Additional refills must be provided by your primary care provider.    Take the doxycycline  as directed.    A urine culture is pending.

## 2024-06-22 NOTE — ED Triage Notes (Signed)
 Patient to Urgent Care for medication refills: - chlorthalidone  tabs 50mg  (last dose this am) - metformin  tab 500mg  (last dose this am) - alfuzosin  ER tab 10mg  - rosuvastatin  tab 20mg   - Glipizide  tab 10mg   Also reports a sore on his right big toe.   Also reports burning w/ urination. Symptoms x3-4 weeks. Used an at home uti kit which showed positive for a UTI.

## 2024-06-23 LAB — URINE CULTURE: Culture: NO GROWTH

## 2024-06-24 ENCOUNTER — Ambulatory Visit (HOSPITAL_COMMUNITY): Payer: Self-pay

## 2024-07-02 ENCOUNTER — Other Ambulatory Visit: Payer: Self-pay | Admitting: Internal Medicine

## 2024-07-02 DIAGNOSIS — I10 Essential (primary) hypertension: Secondary | ICD-10-CM

## 2024-07-03 NOTE — Telephone Encounter (Signed)
Refilled 1 week ago by a different provider. Is it okay to refuse?

## 2024-07-23 ENCOUNTER — Other Ambulatory Visit: Payer: Self-pay

## 2024-07-23 DIAGNOSIS — E1140 Type 2 diabetes mellitus with diabetic neuropathy, unspecified: Secondary | ICD-10-CM

## 2024-07-23 DIAGNOSIS — I10 Essential (primary) hypertension: Secondary | ICD-10-CM
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
## 2024-07-25 MED ORDER — INSULIN NPH (HUMAN) (ISOPHANE) 100 UNIT/ML ~~LOC~~ SUSP: SUBCUTANEOUS | 2 refills | Status: AC

## 2024-07-25 MED ORDER — METFORMIN HCL ER 500 MG PO TB24: ORAL_TABLET | ORAL | 0 refills | Status: DC

## 2024-07-25 MED ORDER — CHLORTHALIDONE 50 MG PO TABS: 50.0000 mg | ORAL_TABLET | Freq: Every day | ORAL | 0 refills | Status: DC

## 2024-07-25 MED ORDER — GLIPIZIDE 10 MG PO TABS: 10.0000 mg | ORAL_TABLET | Freq: Every day | ORAL | 0 refills | Status: DC

## 2024-07-25 NOTE — Telephone Encounter (Signed)
 Patient called to follow up on the medication refills.  Patient is requesting to have this refill today.

## 2024-07-30 ENCOUNTER — Ambulatory Visit

## 2024-08-16 ENCOUNTER — Other Ambulatory Visit: Payer: Self-pay

## 2024-08-17 ENCOUNTER — Other Ambulatory Visit: Payer: Self-pay

## 2024-08-17 DIAGNOSIS — E1140 Type 2 diabetes mellitus with diabetic neuropathy, unspecified: Secondary | ICD-10-CM

## 2024-08-25 ENCOUNTER — Other Ambulatory Visit: Payer: Self-pay | Admitting: Internal Medicine

## 2024-08-25 DIAGNOSIS — I10 Essential (primary) hypertension: Secondary | ICD-10-CM

## 2024-08-27 ENCOUNTER — Ambulatory Visit

## 2024-09-04 ENCOUNTER — Other Ambulatory Visit: Payer: Self-pay

## 2024-09-18 ENCOUNTER — Other Ambulatory Visit: Payer: Self-pay

## 2024-09-18 DIAGNOSIS — N400 Enlarged prostate without lower urinary tract symptoms: Secondary | ICD-10-CM

## 2024-09-19 NOTE — Telephone Encounter (Signed)
 1. Benign prostatic hyperplasia, unspecified whether lower urinary tract symptoms present (Primary) - alfuzosin  (UROXATRAL ) 10 MG 24 hr tablet; TAKE 1 TABLET BY MOUTH DAILY  WITH BREAKFAST  Dispense: 90 tablet; Refill: 0 - 3 Months refill sent. He will need office visit before next refill can be sent.   Luke Shade, MD

## 2024-09-24 NOTE — Progress Notes (Signed)
 Patrick Patel                                          MRN: [id number]   09/24/2024   The [facility] Quality Team Specialist reviewed this patient medical record for the purposes of chart review for care gap closure. The following were reviewed: chart review for care gap closure-kidney health evaluation for diabetes:eGFR  and uACR.    [facility] Quality Team

## 2024-10-20 ENCOUNTER — Telehealth: Payer: Self-pay

## 2024-10-20 ENCOUNTER — Encounter: Payer: Self-pay | Admitting: Pharmacist

## 2024-10-20 NOTE — Progress Notes (Signed)
 Pharmacy Quality Measure Review  This patient is appearing on a report for being at risk of failing the Kidney Health Evaluation for Patients with Diabetes measure this calendar year.   Last documented UACR: 2024 Lab Results  Component Value Date   MICRALBCREAT 126 11/12/2023   Last PCP visit: 04/29/24  Follow up plan: 3 month   No PCP f/u scheduled. Recent med refill sent by PCP with note that patient needs f/u prior to additional refills.   Chart forwarded to scheduling pool for general PCP follow up = overdue

## 2024-10-20 NOTE — Telephone Encounter (Signed)
 I called patient and his voice mailbox is full.  I sent a message to patient asking him to please call us  to schedule an appointment with Dr. Luke Shade.  E2C2 - when patient calls back, please assist him with scheduling a follow-up appointment with Dr. Luke Shade.

## 2024-10-28 NOTE — Telephone Encounter (Signed)
 Open in error

## 2024-10-29 NOTE — Telephone Encounter (Signed)
 open in error

## 2024-11-05 NOTE — Telephone Encounter (Signed)
 Letter printed and mailed to patient to reach out to our office to schedule a follow up appointment.

## 2024-11-17 NOTE — Progress Notes (Signed)
 Patrick Patel                                          MRN: [id number]   11/17/2024   The [facility] Quality Team Specialist reviewed this patient medical record for the purposes of chart review for care gap closure. The following were reviewed: chart review for care gap closure-kidney health evaluation for diabetes:eGFR  and uACR.    [facility] Quality Team

## 2024-12-02 NOTE — Progress Notes (Signed)
 MARCELIS WISSNER                                          MRN: [id number]   12/02/2024   The [facility] Quality Team Specialist reviewed this patient medical record for the purposes of chart review for care gap closure. The following were reviewed: chart review for care gap closure-kidney health evaluation for diabetes:eGFR  and uACR.    [facility] Quality Team

## 2024-12-03 ENCOUNTER — Other Ambulatory Visit: Payer: Self-pay

## 2024-12-03 DIAGNOSIS — N400 Enlarged prostate without lower urinary tract symptoms: Secondary | ICD-10-CM

## 2024-12-03 DIAGNOSIS — I10 Essential (primary) hypertension: Secondary | ICD-10-CM

## 2024-12-30 ENCOUNTER — Other Ambulatory Visit: Payer: Self-pay | Admitting: *Deleted

## 2024-12-30 MED ORDER — GLIPIZIDE 10 MG PO TABS: 10.0000 mg | ORAL_TABLET | Freq: Every day | ORAL | 3 refills | Status: AC

## 2024-12-30 NOTE — Telephone Encounter (Signed)
 Copied from CRM #[id number]. Topic: Clinical - Medication Refill >> Dec 30, 2024  1:09 PM Nessti S wrote: Medication: glipiZIDE  (GLUCOTROL ) 10 MG tablet   Has the patient contacted their pharmacy? Yes (Agent: If no, request that the patient contact the pharmacy for the refill. If patient does not wish to contact the pharmacy document the reason why and proceed with request.) (Agent: If yes, when and what did the pharmacy advise?)  This is the patient's preferred pharmacy:  [facility] - [place], [place] - [address] [place] [address] Phone: [phone] Fax: [phone]  Is this the correct pharmacy for this prescription? Yes If no, delete pharmacy and type the correct one.   Has the prescription been filled recently? No  Is the patient out of the medication? Yes  Has the patient been seen for an appointment in the last year OR does the patient have an upcoming appointment? Yes  Can we respond through MyChart? No  Agent: Please be advised that Rx refills may take up to 3 business days. We ask that you follow-up with your pharmacy.
